# Patient Record
Sex: Male | Born: 1980 | Race: Black or African American | Hispanic: No | Marital: Single | Smoking: Never smoker
Health system: Southern US, Community
[De-identification: ages and names within clinical notes are randomized; demographics above are authoritative.]

## PROBLEM LIST (undated history)

## (undated) DIAGNOSIS — K122 Cellulitis and abscess of mouth: Secondary | ICD-10-CM

## (undated) HISTORY — PX: ABSCESS DRAINAGE: SHX1119

## (undated) HISTORY — PX: APPENDECTOMY: SHX54

---

## 2002-02-20 ENCOUNTER — Emergency Department (HOSPITAL_COMMUNITY): Admission: EM | Admit: 2002-02-20 | Discharge: 2002-02-20 | Payer: Self-pay | Admitting: Emergency Medicine

## 2002-02-20 ENCOUNTER — Encounter: Payer: Self-pay | Admitting: Emergency Medicine

## 2002-08-12 ENCOUNTER — Emergency Department (HOSPITAL_COMMUNITY): Admission: EM | Admit: 2002-08-12 | Discharge: 2002-08-12 | Payer: Self-pay | Admitting: Emergency Medicine

## 2002-08-12 ENCOUNTER — Encounter: Payer: Self-pay | Admitting: Emergency Medicine

## 2004-07-19 ENCOUNTER — Emergency Department (HOSPITAL_COMMUNITY): Admission: EM | Admit: 2004-07-19 | Discharge: 2004-07-19 | Payer: Self-pay | Admitting: Emergency Medicine

## 2005-03-04 ENCOUNTER — Emergency Department (HOSPITAL_COMMUNITY): Admission: EM | Admit: 2005-03-04 | Discharge: 2005-03-04 | Payer: Self-pay | Admitting: Family Medicine

## 2006-07-04 ENCOUNTER — Emergency Department (HOSPITAL_COMMUNITY): Admission: EM | Admit: 2006-07-04 | Discharge: 2006-07-04 | Payer: Self-pay | Admitting: Emergency Medicine

## 2007-09-01 ENCOUNTER — Emergency Department (HOSPITAL_COMMUNITY): Admission: EM | Admit: 2007-09-01 | Discharge: 2007-09-01 | Payer: Self-pay | Admitting: Emergency Medicine

## 2009-01-15 ENCOUNTER — Emergency Department (HOSPITAL_COMMUNITY): Admission: EM | Admit: 2009-01-15 | Discharge: 2009-01-15 | Payer: Self-pay | Admitting: Family Medicine

## 2010-04-25 ENCOUNTER — Inpatient Hospital Stay (HOSPITAL_COMMUNITY): Admission: EM | Admit: 2010-04-25 | Discharge: 2010-04-27 | Payer: Self-pay | Admitting: Emergency Medicine

## 2010-09-06 ENCOUNTER — Emergency Department (HOSPITAL_COMMUNITY)
Admission: EM | Admit: 2010-09-06 | Discharge: 2010-09-06 | Payer: Self-pay | Source: Home / Self Care | Admitting: Emergency Medicine

## 2016-07-29 ENCOUNTER — Emergency Department (HOSPITAL_COMMUNITY)
Admission: EM | Admit: 2016-07-29 | Discharge: 2016-07-29 | Disposition: A | Discharge status: Home / Self Care | Payer: Self-pay | Attending: Emergency Medicine | Admitting: Emergency Medicine

## 2016-07-29 ENCOUNTER — Encounter (HOSPITAL_COMMUNITY): Payer: Self-pay | Admitting: Emergency Medicine

## 2016-07-29 DIAGNOSIS — F17200 Nicotine dependence, unspecified, uncomplicated: Secondary | ICD-10-CM | POA: Insufficient documentation

## 2016-07-29 DIAGNOSIS — K047 Periapical abscess without sinus: Secondary | ICD-10-CM | POA: Insufficient documentation

## 2016-07-29 MED ORDER — IBUPROFEN 400 MG PO TABS
ORAL_TABLET | ORAL | Status: AC
  Filled 2016-07-29: qty 1

## 2016-07-29 MED ORDER — CLINDAMYCIN HCL 150 MG PO CAPS
450.0000 mg | ORAL_CAPSULE | Freq: Three times a day (TID) | ORAL | 0 refills | Status: AC
Start: 2016-07-29 — End: 2016-08-08

## 2016-07-29 MED ORDER — IBUPROFEN 400 MG PO TABS
400.0000 mg | ORAL_TABLET | Freq: Once | ORAL | Status: AC | PRN
  Administered 2016-07-29: 400 mg via ORAL

## 2016-07-29 MED ORDER — CLINDAMYCIN HCL 150 MG PO CAPS
450.0000 mg | ORAL_CAPSULE | Freq: Once | ORAL | Status: AC
  Administered 2016-07-29: 450 mg via ORAL
  Filled 2016-07-29: qty 3

## 2016-07-29 NOTE — ED Notes (Signed)
Pt stable, understands discharge instructions, and reasons for return.   

## 2016-07-29 NOTE — ED Triage Notes (Signed)
Pt states "my face has been swollen since last night, its just getting bigger and bigger. Its throbbing right now. Ive had an abscess on my bottom jaw years ago. Now its on the top half and now it feels like its in my cheek bone." Pt has large amount of swelling to L side of face. Resp e/u.

## 2016-07-29 NOTE — ED Provider Notes (Signed)
[facility] Provider Note   CSN: [id number] Arrival date & time: 07/29/16  1356     History   Chief Complaint Chief Complaint  Patient presents with  . Facial Swelling    HPI Anthony Macias is a 35 y.o. male.   Illness  This is a new problem. The current episode started 2 days ago. The problem occurs constantly. The problem has been gradually worsening. Pertinent negatives include no chest pain, no abdominal pain and no shortness of breath. Associated symptoms comments: Face swelling and pain. Nothing aggravates the symptoms. Nothing relieves the symptoms. He has tried acetaminophen for the symptoms. The treatment provided moderate relief.    History reviewed. No pertinent past medical history.  There are no active problems to display for this patient.   Past Surgical History:  Procedure Laterality Date  . ABSCESS DRAINAGE     lower jaw  . APPENDECTOMY         Home Medications    Prior to Admission medications   Medication Sig Start Date End Date Taking? Authorizing Provider  acetaminophen (TYLENOL) 500 MG tablet Take 500 mg by mouth every 6 (six) hours as needed for headache (or pain).   Yes Historical Provider, MD  [employer] OP Apply 1-2 drops to eye 3 (three) times daily as needed (for dryness).   Yes Historical Provider, MD  ibuprofen (ADVIL,MOTRIN) 200 MG tablet Take 200-400 mg by mouth every 6 (six) hours as needed for headache (or pain).   Yes Historical Provider, MD  clindamycin (CLEOCIN) 150 MG capsule Take 3 capsules (450 mg total) by mouth 3 (three) times daily. 07/29/16 08/08/16  Cherlynn PerchesEric Limmie Schoenberg, MD    Family History No family history on file.  Social History Social History  Substance Use Topics  . Smoking status: Current Some Day Smoker  . Smokeless tobacco: Not on file  . Alcohol use Yes     Allergies   Patient has no known allergies.   Review of Systems Review of Systems  Constitutional: Negative for chills and fever.    HENT: Positive for congestion, dental problem, facial swelling, rhinorrhea and sinus pressure. Negative for drooling, ear pain, sore throat and trouble swallowing.   Eyes: Negative for pain and visual disturbance.  Respiratory: Negative for cough and shortness of breath.   Cardiovascular: Negative for chest pain and palpitations.  Gastrointestinal: Negative for abdominal pain and vomiting.  Genitourinary: Negative for dysuria and hematuria.  Musculoskeletal: Negative for arthralgias and back pain.  Skin: Negative for color change and rash.  Neurological: Negative for seizures and syncope.  All other systems reviewed and are negative.    Physical Exam Updated Vital Signs BP (!) 146/101   Pulse 90   Temp 99.3 F (37.4 C) (Oral)   Resp 16   SpO2 98%   Physical Exam  Constitutional: He appears well-developed and well-nourished.  HENT:  Head: Normocephalic and atraumatic. Head is without right periorbital erythema and without left periorbital erythema.    Right Ear: Tympanic membrane normal.  Left Ear: Tympanic membrane normal.  Nose: Nose normal.  Mouth/Throat: Uvula is midline, oropharynx is clear and moist and mucous membranes are normal. Abnormal dentition. Dental caries present. No uvula swelling.  No induration or swelling along the gumline, palpation of the nuchal glucose as well as the parotid area on the lateral face shows some induration with no fluctuance. Multiple dental caries fractured teeth and missing teeth, no induration under the, no trouble swallowing uvula midline.  Eyes: Conjunctivae  are normal.  Neck: Neck supple.  Cardiovascular: Normal rate and regular rhythm.   No murmur heard. Pulmonary/Chest: Effort normal and breath sounds normal. No respiratory distress.  Abdominal: Soft. There is no tenderness.  Musculoskeletal: He exhibits no edema.  Neurological: He is alert.  Skin: Skin is warm and dry.  Psychiatric: He has a normal mood and affect.  Nursing  note and vitals reviewed.    ED Treatments / Results  Labs (all labs ordered are listed, but only abnormal results are displayed) Labs Reviewed - No data to display  EKG  EKG Interpretation None       Radiology No results found.  Procedures Procedures (including critical care time)  Medications Ordered in ED Medications  ibuprofen (ADVIL,MOTRIN) tablet 400 mg ( Oral Canceled Entry 07/29/16 1803)  clindamycin (CLEOCIN) capsule 450 mg (450 mg Oral Given 07/29/16 1926)     Initial Impression / Assessment and Plan / ED Course  I have reviewed the triage vital signs and the nursing notes.  Pertinent labs & imaging results that were available during my care of the patient were reviewed by me and considered in my medical decision making (see chart for details).  Clinical Course    35 year old male comes with recurrent face swelling. His left cheek is swollen. No abscess palpated within the nuchal mucosa or gingival mucosa. Induration along the surface but no fluctuant collection. Very poor dentition likely dental abscess patient is given clindamycin and told to use NSAIDs and Tylenol for pain. He is to follow-up with dentistry. He has had problems with this beforealways a dentistry evaluate and treat. Patient is nontoxic-appearing no trouble swallowing no concerns for cellulitis of the eye as he is able to range it fully does not extend around the eye. He is afebrile vital signs are stable. He is safe for discharge home. Strict return precautions are given.  Final Clinical Impressions(s) / ED Diagnoses   Final diagnoses:  Dental abscess    New Prescriptions Discharge Medication List as of 07/29/2016  7:22 PM    START taking these medications   Details  clindamycin (CLEOCIN) 150 MG capsule Take 3 capsules (450 mg total) by mouth 3 (three) times daily., Starting Fri 07/29/2016, Until Mon 08/08/2016, Print         Cherlynn PerchesEric Lauralye Kinn, MD 07/30/16 [id number]    Tilden FossaElizabeth Rees, MD 08/01/16  [id number]

## 2016-07-29 NOTE — ED Notes (Signed)
Pt states his pain has increased to a 5/10. Given ibuprofen for the pain.

## 2016-07-30 ENCOUNTER — Emergency Department (HOSPITAL_COMMUNITY)
Admission: EM | Admit: 2016-07-30 | Discharge: 2016-07-30 | Disposition: A | Discharge status: Home / Self Care | Payer: Self-pay | Attending: Emergency Medicine | Admitting: Emergency Medicine

## 2016-07-30 ENCOUNTER — Telehealth: Payer: Self-pay | Admitting: *Deleted

## 2016-07-30 ENCOUNTER — Encounter (HOSPITAL_COMMUNITY): Payer: Self-pay | Admitting: Emergency Medicine

## 2016-07-30 DIAGNOSIS — F17200 Nicotine dependence, unspecified, uncomplicated: Secondary | ICD-10-CM | POA: Insufficient documentation

## 2016-07-30 DIAGNOSIS — K047 Periapical abscess without sinus: Secondary | ICD-10-CM | POA: Insufficient documentation

## 2016-07-30 MED ORDER — IBUPROFEN 400 MG PO TABS
800.0000 mg | ORAL_TABLET | Freq: Once | ORAL | Status: AC
  Administered 2016-07-30: 800 mg via ORAL
  Filled 2016-07-30: qty 2

## 2016-07-30 MED ORDER — CLINDAMYCIN HCL 150 MG PO CAPS
300.0000 mg | ORAL_CAPSULE | Freq: Once | ORAL | Status: AC
  Administered 2016-07-30: 300 mg via ORAL
  Filled 2016-07-30: qty 2

## 2016-07-30 MED ORDER — NAPROXEN 250 MG PO TABS: 250.0000 mg | ORAL_TABLET | Freq: Two times a day (BID) | ORAL | 0 refills | Status: DC

## 2016-07-30 MED ORDER — OXYCODONE-ACETAMINOPHEN 5-325 MG PO TABS: 1.0000 | ORAL_TABLET | Freq: Four times a day (QID) | ORAL | 0 refills | Status: DC | PRN

## 2016-07-30 MED ORDER — OXYCODONE-ACETAMINOPHEN 5-325 MG PO TABS
2.0000 | ORAL_TABLET | Freq: Once | ORAL | Status: AC
  Administered 2016-07-30: 2 via ORAL
  Filled 2016-07-30: qty 2

## 2016-07-30 MED ORDER — ONDANSETRON 4 MG PO TBDP
4.0000 mg | ORAL_TABLET | Freq: Once | ORAL | Status: AC
  Administered 2016-07-30: 4 mg via ORAL
  Filled 2016-07-30: qty 1

## 2016-07-30 MED ORDER — BUPIVACAINE-EPINEPHRINE 0.5% -1:200000 IJ SOLN: 1.8000 mL | Freq: Once | INTRAMUSCULAR | Status: DC

## 2016-07-30 NOTE — Telephone Encounter (Signed)
Offered Good Rx to pts mother which offered considerable discount. $20.25. Mother pleased. No further CM needs at present.

## 2016-07-30 NOTE — ED Provider Notes (Signed)
[facility] Provider Note   CSN: [id number] Arrival date & time: 07/30/16  0734     History   Chief Complaint Chief Complaint  Patient presents with  . Dental Pain    HPI Anthony Macias is a 35 y.o. male.  Anthony Macias is a 35 y.o. Male who presents to the ED complaining of increasing facial swelling and dental pain. She was seen in the emergency department yesterday and discharged with prescriptions for ibuprofen and clindamycin. He was given his first visit clindamycin in the emergency department. He tells me he was unable to obtain his antibiotics as he did not have a ride. He has had no new treatments since being seen last night. He tells me he feels like his facial swelling has worsened. He's had no trouble opening his mouth or swallowing. No sore throat. No discharge from his mouth. He denies any fevers or neck pain. No trouble moving his neck or tongue. He has not followed up with a dentist. He does not have a current dentist. He's had many problems with dental abscesses previously. Patient denies fevers, neck pain, neck stiffness, discharge from his mouth, trouble swallowing, trouble opening his mouth, ear pain, ear discharge, rashes, changes to his vision or other symptoms.   The history is provided by the patient. No language interpreter was used.  Dental Pain      History reviewed. No pertinent past medical history.  There are no active problems to display for this patient.   Past Surgical History:  Procedure Laterality Date  . ABSCESS DRAINAGE     lower jaw  . APPENDECTOMY         Home Medications    Prior to Admission medications   Medication Sig Start Date End Date Taking? Authorizing Provider  acetaminophen (TYLENOL) 500 MG tablet Take 500 mg by mouth every 6 (six) hours as needed for headache (or pain).    Historical Provider, MD  [employer] OP Apply 1-2 drops to eye 3 (three) times daily as needed (for dryness).    Historical  Provider, MD  clindamycin (CLEOCIN) 150 MG capsule Take 3 capsules (450 mg total) by mouth 3 (three) times daily. 07/29/16 08/08/16  Cherlynn PerchesEric Katz, MD  ibuprofen (ADVIL,MOTRIN) 200 MG tablet Take 200-400 mg by mouth every 6 (six) hours as needed for headache (or pain).    Historical Provider, MD  naproxen (NAPROSYN) 250 MG tablet Take 1 tablet (250 mg total) by mouth 2 (two) times daily with a meal. 07/30/16   Everlene FarrierWilliam Mehtaab Mayeda, PA-C  oxyCODONE-acetaminophen (PERCOCET/ROXICET) 5-325 MG tablet Take 1-2 tablets by mouth every 6 (six) hours as needed for moderate pain or severe pain. 07/30/16   Everlene FarrierWilliam Katharyn Schauer, PA-C    Family History No family history on file.  Social History Social History  Substance Use Topics  . Smoking status: Current Some Day Smoker  . Smokeless tobacco: Current User  . Alcohol use Yes     Allergies   Patient has no known allergies.   Review of Systems Review of Systems  Constitutional: Negative for chills and fever.  HENT: Positive for dental problem and facial swelling. Negative for drooling, ear pain, mouth sores, sore throat, trouble swallowing and voice change.   Eyes: Negative for pain and visual disturbance.  Musculoskeletal: Negative for neck pain and neck stiffness.  Skin: Negative for rash.  Neurological: Negative for headaches.     Physical Exam Updated Vital Signs BP (!) 141/101 (BP Location: Right Arm)  Pulse 96   Temp 98.2 F (36.8 C) (Oral)   Resp 16   Ht 5' 10.5" (1.791 m)   Wt 77.1 kg   SpO2 96%   BMI 24.05 kg/m   Physical Exam  Constitutional: He is oriented to person, place, and time. He appears well-developed and well-nourished. No distress.  Non-toxic appearing.   HENT:  Head: Normocephalic and atraumatic.  Right Ear: External ear normal.  Left Ear: External ear normal.  Mouth/Throat: Oropharynx is clear and moist. No oropharyngeal exudate.  Tenderness to left upper molars. Facial swelling noted. Induration noted in his mouth with  some slight fluctuance.  No discharge from the mouth.   Uvula is midline without edema. Soft palate rises symmetrically. No tonsillar hypertrophy or exudates. Tongue protrusion is normal. No trismus. He is able to open his mouth more than three finger widths. No swelling or TTP to his mandible or beneath his mandible. No drooling.   Eyes: Conjunctivae are normal. Pupils are equal, round, and reactive to light. Right eye exhibits no discharge. Left eye exhibits no discharge.  Neck: Normal range of motion. Neck supple. No JVD present. No tracheal deviation present.  Cardiovascular: Normal rate, regular rhythm, normal heart sounds and intact distal pulses.   Pulmonary/Chest: Effort normal and breath sounds normal. No stridor. No respiratory distress.  Abdominal: Soft. There is no tenderness.  Lymphadenopathy:    He has no cervical adenopathy.  Neurological: He is alert and oriented to person, place, and time. No cranial nerve deficit. Coordination normal.  Skin: Skin is warm and dry. Capillary refill takes less than 2 seconds. No rash noted. He is not diaphoretic. No erythema. No pallor.  Psychiatric: He has a normal mood and affect. His behavior is normal.  Nursing note and vitals reviewed.    ED Treatments / Results  Labs (all labs ordered are listed, but only abnormal results are displayed) Labs Reviewed - No data to display  EKG  EKG Interpretation None       Radiology No results found.  Procedures .Marland Kitchen.Incision and Drainage Date/Time: 07/30/2016 8:31 AM Performed by: Everlene FarrierANSIE, Nohlan Burdin Authorized by: Everlene FarrierANSIE, Dawson Albers   Consent:    Consent obtained:  Verbal   Consent given by:  Patient   Risks discussed:  Bleeding, incomplete drainage, pain, infection and damage to other organs   Alternatives discussed:  No treatment and alternative treatment Location:    Type:  Abscess   Location:  Mouth   Mouth location:  Alveolar process Anesthesia (see MAR for exact dosages):     Anesthesia method:  Local infiltration   Local anesthetic:  Bupivacaine 0.5% WITH epi Procedure type:    Complexity:  Complex Procedure details:    Needle aspiration: no     Incision types:  Stab incision   Incision depth:  Submucosal   Scalpel blade:  15   Wound management:  Probed and deloculated and irrigated with saline   Drainage:  Bloody and purulent   Drainage amount:  Moderate   Wound treatment:  Wound left open   Packing materials:  None Post-procedure details:    Patient tolerance of procedure:  Tolerated well, no immediate complications Comments:     Patient had I&D my Dentist Dr. Theodoro Gristho and myself supervising.    (including critical care time)  Medications Ordered in ED Medications  clindamycin (CLEOCIN) capsule 300 mg (not administered)  ibuprofen (ADVIL,MOTRIN) tablet 800 mg (not administered)  oxyCODONE-acetaminophen (PERCOCET/ROXICET) 5-325 MG per tablet 2 tablet (not administered)  ondansetron (ZOFRAN-ODT)  disintegrating tablet 4 mg (not administered)     Initial Impression / Assessment and Plan / ED Course  I have reviewed the triage vital signs and the nursing notes.  Pertinent labs & imaging results that were available during my care of the patient were reviewed by me and considered in my medical decision making (see chart for details).  Clinical Course    This is a 35 y.o. Male who presents to the ED complaining of increasing facial swelling and dental pain. She was seen in the emergency department yesterday and discharged with prescriptions for ibuprofen and clindamycin. He was given his first visit clindamycin in the emergency department. He tells me he was unable to obtain his antibiotics as he did not have a ride. He has had no new treatments since being seen last night. He tells me he feels like his facial swelling has worsened. He's had no trouble opening his mouth or swallowing. No sore throat. No discharge from his mouth. He denies any fevers or neck  pain. No trouble moving his neck or tongue. He has not followed up with a dentist.  On exam patient is afebrile nontoxic appearing. He has swelling to the left side of his face with induration and mild fluctuance in the interior aspect. No drainage. No trismus. No drooling. He is able to open his mouth more than 3 finger widths. No changes to his voice. He is nontoxic-appearing. I would like to drain this patient's dental abscess. I saw the patient with [facility] resident Dentist Dr. Theodoro Grist and together we preformed an I&D and this was tolerated well by the patient. He tells me his friend is picking up his clindamycin today at the pharmacy. Will provide him with another dose here today until he can get this picked up. Also provided him with a prescription for naproxen and a short course of Percocet. I provided him with information for follow-up with dentist Dr. Mayford Knife. I advised the patient to follow-up with their primary care provider this week. I advised the patient to return to the emergency department with new or worsening symptoms or new concerns. The patient verbalized understanding and agreement with plan.      Final Clinical Impressions(s) / ED Diagnoses   Final diagnoses:  Dental abscess    New Prescriptions New Prescriptions   NAPROXEN (NAPROSYN) 250 MG TABLET    Take 1 tablet (250 mg total) by mouth 2 (two) times daily with a meal.   OXYCODONE-ACETAMINOPHEN (PERCOCET/ROXICET) 5-325 MG TABLET    Take 1-2 tablets by mouth every 6 (six) hours as needed for moderate pain or severe pain.     Everlene Farrier, PA-C 07/30/16 1610    Laurence Spates, MD 07/31/16 [phone]

## 2016-07-30 NOTE — ED Triage Notes (Signed)
Pt. Stated, I was here last night for the same problem dental/facial swelling. Did not get the medication filled.

## 2016-12-29 ENCOUNTER — Emergency Department (HOSPITAL_COMMUNITY)
Admission: EM | Admit: 2016-12-29 | Discharge: 2016-12-30 | Disposition: A | Discharge status: Home / Self Care | Payer: Self-pay | Attending: Emergency Medicine | Admitting: Emergency Medicine

## 2016-12-29 ENCOUNTER — Encounter (HOSPITAL_COMMUNITY): Payer: Self-pay

## 2016-12-29 DIAGNOSIS — K047 Periapical abscess without sinus: Secondary | ICD-10-CM | POA: Insufficient documentation

## 2016-12-29 DIAGNOSIS — F17200 Nicotine dependence, unspecified, uncomplicated: Secondary | ICD-10-CM | POA: Insufficient documentation

## 2016-12-29 DIAGNOSIS — Z79899 Other long term (current) drug therapy: Secondary | ICD-10-CM | POA: Insufficient documentation

## 2016-12-29 HISTORY — DX: Cellulitis and abscess of mouth: K12.2

## 2016-12-29 NOTE — ED Provider Notes (Signed)
[facility] Provider Note   CSN: [id number] Arrival date & time: 12/29/16  2155  By signing my name below, I, Modena JanskyAlbert Thayil, attest that this documentation has been prepared under the direction and in the presence of non-physician practitioner, Kerrie BuffaloHope Neese, NP. Electronically Signed: Modena JanskyAlbert Thayil, Scribe. 12/29/2016. 11:55 PM.  History   Chief Complaint No chief complaint on file.  The history is provided by the patient. No language interpreter was used.  Dental Pain   This is a new problem. The problem occurs constantly. The problem has not changed since onset.The pain is moderate. He has tried nothing for the symptoms.   HPI Comments: Anthony Killiansaiah Anthony Macias is a 36 y.o. male with a PMHx of mouth infection who presents to the Emergency Department complaining of constant moderate left upper dental pain that started a few months ago. He states he was seen in the ED on 07/30/16 for a dental abscess but never followed up with a dentist. He had temporary relief then but his pain has worsened otherwise. He reports associated left-sided facial swelling. Denies any other complaints at this time.  Past Medical History:  Diagnosis Date  . Infection of mouth     There are no active problems to display for this patient.   Past Surgical History:  Procedure Laterality Date  . ABSCESS DRAINAGE     lower jaw  . APPENDECTOMY         Home Medications    Prior to Admission medications   Medication Sig Start Date End Date Taking? Authorizing Provider  acetaminophen (TYLENOL) 500 MG tablet Take 500 mg by mouth every 6 (six) hours as needed for headache (or pain).    Historical Provider, MD  amoxicillin (AMOXIL) 500 MG capsule Take 1 capsule (500 mg total) by mouth 3 (three) times daily. 12/30/16   Hope Orlene OchM Neese, NP  [employer] Apply 1-2 drops to eye 3 (three) times daily as needed (for dryness).    Historical Provider, MD  ibuprofen (ADVIL,MOTRIN) 600 MG tablet Take 1 tablet (600  mg total) by mouth every 6 (six) hours as needed. 12/30/16   Hope Orlene OchM Neese, NP  traMADol (ULTRAM) 50 MG tablet Take 1 tablet (50 mg total) by mouth every 6 (six) hours as needed. 12/30/16   Hope Orlene OchM Neese, NP    Family History No family history on file.  Social History Social History  Substance Use Topics  . Smoking status: Current Some Day Smoker  . Smokeless tobacco: Current User  . Alcohol use Yes     Allergies   Patient has no known allergies.   Review of Systems Review of Systems  Constitutional: Negative for chills and fever.  HENT: Positive for dental problem, facial swelling (Left-sided) and mouth sores. Negative for trouble swallowing.   Gastrointestinal: Negative for nausea and vomiting.  Musculoskeletal: Negative for neck pain.  Neurological: Negative for headaches.     Physical Exam Updated Vital Signs BP (!) 137/96 (BP Location: Right Arm)   Pulse 67   Temp 98.9 F (37.2 C) (Oral)   Resp 18   SpO2 97%   Physical Exam  Constitutional: He appears well-developed and well-nourished. No distress.  HENT:  Head: Normocephalic and atraumatic.  Mouth/Throat: No trismus in the jaw. Dental abscesses and dental caries present.  Multiple dental carries. Left upper molar is decayed to the gumline. Swelling to gum surrounding the tooth. Abscess noted. Facial swelling on the left.   Eyes: Conjunctivae are normal.  Neck: Neck  supple.  Cardiovascular: Normal rate.   Pulmonary/Chest: Effort normal.  Abdominal: Soft.  Musculoskeletal: Normal range of motion.  Neurological: He is alert.  Skin: Skin is warm and dry.  Psychiatric: He has a normal mood and affect.  Nursing note and vitals reviewed.    ED Treatments / Results  DIAGNOSTIC STUDIES: Oxygen Saturation is 97% on RA, normal by my interpretation.    COORDINATION OF CARE: 11:59 PM- Pt advised of plan for treatment and pt agrees.  Labs (all labs ordered are listed, but only abnormal results are displayed) Labs  Reviewed - No data to display   Radiology No results found.  Procedures .Marland KitchenIncision and Drainage Date/Time: 12/30/2016 12:40 AM Performed by: Janne Napoleon Authorized by: Janne Napoleon   Consent:    Consent obtained:  Verbal   Consent given by:  Patient   Risks discussed:  Incomplete drainage and pain   Alternatives discussed:  No treatment and referral Location:    Type:  Abscess   Location:  Mouth (upper left gum above first molar) Anesthesia (see MAR for exact dosages):    Anesthesia method:  Topical application Procedure type:    Complexity:  Simple Procedure details:    Needle aspiration: yes     Needle size:  18 G   Drainage:  Purulent   Drainage amount:  Moderate   Wound treatment:  Wound left open Post-procedure details:    Patient tolerance of procedure:  Tolerated well, no immediate complications   (including critical care time)  Medications Ordered in ED Medications  amoxicillin (AMOXIL) capsule 500 mg (500 mg Oral Given 12/30/16 0041)  HYDROcodone-acetaminophen (NORCO/VICODIN) 5-325 MG per tablet 1 tablet (1 tablet Oral Given 12/30/16 0041)     Initial Impression / Assessment and Plan / ED Course  I have reviewed the triage vital signs and the nursing notes.  Patient with toothache and abscess.  Exam unconcerning for Ludwig'Anthony angina or spread of infection.  Will treat with amoxicillin and pain medicine.  Urged patient to follow-up with dentist. Referral given  Final Clinical Impressions(Anthony) / ED Diagnoses   Final diagnoses:  Dental abscess    New Prescriptions New Prescriptions   AMOXICILLIN (AMOXIL) 500 MG CAPSULE    Take 1 capsule (500 mg total) by mouth 3 (three) times daily.   IBUPROFEN (ADVIL,MOTRIN) 600 MG TABLET    Take 1 tablet (600 mg total) by mouth every 6 (six) hours as needed.   TRAMADOL (ULTRAM) 50 MG TABLET    Take 1 tablet (50 mg total) by mouth every 6 (six) hours as needed.   I personally performed the services described in this  documentation, which was scribed in my presence. The recorded information has been reviewed and is accurate.     [address] [place], [place] 12/30/16 1610    Tilden Fossa, MD 12/30/16 [phone]

## 2016-12-29 NOTE — ED Triage Notes (Signed)
Pt states he came in to be tx for mouth infections; pt states sx started 24 hours ago; pt states he has been seen for same sx a month ago; pt states he never followed up with dentist; pt states sx resolved but came back; pt presents with swelling to left side of jaw; pt denies pain on at triage but states discomfort;

## 2016-12-30 MED ORDER — TRAMADOL HCL 50 MG PO TABS
50.0000 mg | ORAL_TABLET | Freq: Four times a day (QID) | ORAL | 0 refills | Status: DC | PRN
Start: 2016-12-30 — End: 2017-05-14

## 2016-12-30 MED ORDER — AMOXICILLIN 500 MG PO CAPS: 500.0000 mg | ORAL_CAPSULE | Freq: Three times a day (TID) | ORAL | 0 refills | Status: DC

## 2016-12-30 MED ORDER — IBUPROFEN 600 MG PO TABS: 600.0000 mg | ORAL_TABLET | Freq: Four times a day (QID) | ORAL | 0 refills | Status: DC | PRN

## 2016-12-30 MED ORDER — HYDROCODONE-ACETAMINOPHEN 5-325 MG PO TABS
1.0000 | ORAL_TABLET | Freq: Once | ORAL | Status: AC
  Administered 2016-12-30: 1 via ORAL
  Filled 2016-12-30: qty 1

## 2016-12-30 MED ORDER — AMOXICILLIN 500 MG PO CAPS
500.0000 mg | ORAL_CAPSULE | Freq: Once | ORAL | Status: AC
  Administered 2016-12-30: 500 mg via ORAL
  Filled 2016-12-30: qty 1

## 2016-12-30 NOTE — Discharge Instructions (Signed)
Do not drive while taking the narcotic as it will make you sleepy. Follow up with Dr. Michiel SitesKoelling.

## 2017-05-14 ENCOUNTER — Encounter (HOSPITAL_COMMUNITY): Payer: Self-pay | Admitting: Emergency Medicine

## 2017-05-14 ENCOUNTER — Emergency Department (HOSPITAL_COMMUNITY)
Admission: EM | Admit: 2017-05-14 | Discharge: 2017-05-14 | Disposition: A | Discharge status: Home / Self Care | Payer: Self-pay | Attending: Emergency Medicine | Admitting: Emergency Medicine

## 2017-05-14 DIAGNOSIS — F17200 Nicotine dependence, unspecified, uncomplicated: Secondary | ICD-10-CM | POA: Insufficient documentation

## 2017-05-14 DIAGNOSIS — K047 Periapical abscess without sinus: Secondary | ICD-10-CM | POA: Insufficient documentation

## 2017-05-14 MED ORDER — IBUPROFEN 400 MG PO TABS
600.0000 mg | ORAL_TABLET | Freq: Once | ORAL | Status: AC
  Administered 2017-05-14: 600 mg via ORAL
  Filled 2017-05-14: qty 1

## 2017-05-14 MED ORDER — IBUPROFEN 600 MG PO TABS: 600.0000 mg | ORAL_TABLET | Freq: Three times a day (TID) | ORAL | 0 refills | Status: AC | PRN

## 2017-05-14 MED ORDER — PENICILLIN V POTASSIUM 250 MG PO TABS
500.0000 mg | ORAL_TABLET | Freq: Once | ORAL | Status: AC
  Administered 2017-05-14: 500 mg via ORAL
  Filled 2017-05-14: qty 2

## 2017-05-14 MED ORDER — PENICILLIN V POTASSIUM 500 MG PO TABS: 500.0000 mg | ORAL_TABLET | Freq: Three times a day (TID) | ORAL | 0 refills | Status: AC

## 2017-05-14 NOTE — ED Triage Notes (Signed)
Pt having 5/10 left lower dental pain that started last night, pt states he has hx of abscess on the past and want to be check. No fever or chills.

## 2017-05-14 NOTE — ED Provider Notes (Signed)
[facility]-EMERGENCY DEPT Provider Note   CSN: [id number] Arrival date & time: 05/14/17  0242     History   Chief Complaint Chief Complaint  Patient presents with  . Dental Pain    HPI Anthony Macias is a 36 y.o. male.  HPI Patient is a 36 year old male presents emergency department left lower dental pain which began last night.  His pain is moderate in severity.  He reports a prior history of dental abscess and feels like this may be the beginning of one again.  Denies fevers and chills.  No difficulty breathing or swallowing.  No facial or neck swelling.  No other complaints.   Past Medical History:  Diagnosis Date  . Infection of mouth     There are no active problems to display for this patient.   Past Surgical History:  Procedure Laterality Date  . ABSCESS DRAINAGE     lower jaw  . APPENDECTOMY         Home Medications    Prior to Admission medications   Medication Sig Start Date End Date Taking? Authorizing Provider  ibuprofen (ADVIL,MOTRIN) 600 MG tablet Take 1 tablet (600 mg total) by mouth every 8 (eight) hours as needed for mild pain or moderate pain. 05/14/17   Azalia Bilis, MD  penicillin v potassium (VEETID) 500 MG tablet Take 1 tablet (500 mg total) by mouth 3 (three) times daily. 05/14/17   Azalia Bilis, MD    Family History No family history on file.  Social History Social History  Substance Use Topics  . Smoking status: Current Some Day Smoker  . Smokeless tobacco: Current User  . Alcohol use Yes     Allergies   Patient has no known allergies.   Review of Systems Review of Systems  All other systems reviewed and are negative.    Physical Exam Updated Vital Signs BP (!) 137/92 (BP Location: Right Arm)   Pulse 66   Temp 98.5 F (36.9 C) (Oral)   Resp 18   Ht 6' (1.829 m)   Wt 77.1 kg (170 lb)   SpO2 97%   BMI 23.06 kg/m   Physical Exam  Constitutional: He is oriented to person, place, and time. He appears well-developed and  well-nourished.  HENT:  Head: Normocephalic.  Obvious dental decay of tooth #20 with some tenderness.  No gingival swelling or fluctuance.  Oral airway patent.  No swelling under his tongue.  Tolerating secretions.  No trismus.  Eyes: EOM are normal.  Neck: Normal range of motion.  Anterior neck normal.  No significant lymphadenopathy.  No swelling.  No erythema.  Pulmonary/Chest: Effort normal.  Abdominal: He exhibits no distension.  Musculoskeletal: Normal range of motion.  Neurological: He is alert and oriented to person, place, and time.  Psychiatric: He has a normal mood and affect.  Nursing note and vitals reviewed.    ED Treatments / Results  Labs (all labs ordered are listed, but only abnormal results are displayed) Labs Reviewed - No data to display  EKG  EKG Interpretation None       Radiology No results found.  Procedures Procedures (including critical care time)  Medications Ordered in ED Medications - No data to display   Initial Impression / Assessment and Plan / ED Course  I have reviewed the triage vital signs and the nursing notes.  Pertinent labs & imaging results that were available during my care of the patient were reviewed by me and considered in my medical decision  making (see chart for details).     No signs to suggest Ludwig angina.  Overall well-appearing.  Home with penicillin and anti-inflammatories.  Dental follow-up.  He understands to return to the ER for new or worsening symptoms  Final Clinical Impressions(s) / ED Diagnoses   Final diagnoses:  Dental infection    New Prescriptions New Prescriptions   PENICILLIN V POTASSIUM (VEETID) 500 MG TABLET    Take 1 tablet (500 mg total) by mouth 3 (three) times daily.     Azalia Bilis, MD 05/14/17 [phone]

## 2020-03-11 ENCOUNTER — Inpatient Hospital Stay (HOSPITAL_COMMUNITY)
Admission: EM | Admit: 2020-03-11 | Discharge: 2020-05-12 | DRG: 956 | Disposition: A | Discharge status: Rehabilitation Facility | Payer: 59 | Attending: General Surgery | Admitting: General Surgery

## 2020-03-11 ENCOUNTER — Emergency Department (HOSPITAL_COMMUNITY): Payer: 59

## 2020-03-11 ENCOUNTER — Encounter (HOSPITAL_COMMUNITY): Payer: Self-pay

## 2020-03-11 DIAGNOSIS — S42251A Displaced fracture of greater tuberosity of right humerus, initial encounter for closed fracture: Secondary | ICD-10-CM | POA: Diagnosis present

## 2020-03-11 DIAGNOSIS — S022XXA Fracture of nasal bones, initial encounter for closed fracture: Secondary | ICD-10-CM | POA: Diagnosis present

## 2020-03-11 DIAGNOSIS — G9600 Cerebrospinal fluid leak, unspecified: Secondary | ICD-10-CM | POA: Diagnosis present

## 2020-03-11 DIAGNOSIS — R402312 Coma scale, best motor response, none, at arrival to emergency department: Secondary | ICD-10-CM | POA: Diagnosis present

## 2020-03-11 DIAGNOSIS — Y908 Blood alcohol level of 240 mg/100 ml or more: Secondary | ICD-10-CM | POA: Diagnosis present

## 2020-03-11 DIAGNOSIS — G9389 Other specified disorders of brain: Secondary | ICD-10-CM | POA: Diagnosis present

## 2020-03-11 DIAGNOSIS — E876 Hypokalemia: Secondary | ICD-10-CM | POA: Diagnosis not present

## 2020-03-11 DIAGNOSIS — S42011A Anterior displaced fracture of sternal end of right clavicle, initial encounter for closed fracture: Secondary | ICD-10-CM | POA: Diagnosis present

## 2020-03-11 DIAGNOSIS — S0181XA Laceration without foreign body of other part of head, initial encounter: Secondary | ICD-10-CM | POA: Diagnosis present

## 2020-03-11 DIAGNOSIS — S72421A Displaced fracture of lateral condyle of right femur, initial encounter for closed fracture: Secondary | ICD-10-CM | POA: Diagnosis present

## 2020-03-11 DIAGNOSIS — Z452 Encounter for adjustment and management of vascular access device: Secondary | ICD-10-CM

## 2020-03-11 DIAGNOSIS — S065X9A Traumatic subdural hemorrhage with loss of consciousness of unspecified duration, initial encounter: Secondary | ICD-10-CM | POA: Diagnosis present

## 2020-03-11 DIAGNOSIS — S72432A Displaced fracture of medial condyle of left femur, initial encounter for closed fracture: Secondary | ICD-10-CM | POA: Diagnosis present

## 2020-03-11 DIAGNOSIS — S2242XA Multiple fractures of ribs, left side, initial encounter for closed fracture: Secondary | ICD-10-CM | POA: Diagnosis present

## 2020-03-11 DIAGNOSIS — S42136A Nondisplaced fracture of coracoid process, unspecified shoulder, initial encounter for closed fracture: Secondary | ICD-10-CM

## 2020-03-11 DIAGNOSIS — S0231XA Fracture of orbital floor, right side, initial encounter for closed fracture: Secondary | ICD-10-CM | POA: Diagnosis present

## 2020-03-11 DIAGNOSIS — S72402A Unspecified fracture of lower end of left femur, initial encounter for closed fracture: Secondary | ICD-10-CM | POA: Diagnosis present

## 2020-03-11 DIAGNOSIS — S02109A Fracture of base of skull, unspecified side, initial encounter for closed fracture: Secondary | ICD-10-CM | POA: Diagnosis present

## 2020-03-11 DIAGNOSIS — J9601 Acute respiratory failure with hypoxia: Secondary | ICD-10-CM | POA: Diagnosis present

## 2020-03-11 DIAGNOSIS — R402212 Coma scale, best verbal response, none, at arrival to emergency department: Secondary | ICD-10-CM | POA: Diagnosis present

## 2020-03-11 DIAGNOSIS — L8990 Pressure ulcer of unspecified site, unspecified stage: Secondary | ICD-10-CM | POA: Diagnosis not present

## 2020-03-11 DIAGNOSIS — H11421 Conjunctival edema, right eye: Secondary | ICD-10-CM | POA: Diagnosis present

## 2020-03-11 DIAGNOSIS — S066X9A Traumatic subarachnoid hemorrhage with loss of consciousness of unspecified duration, initial encounter: Principal | ICD-10-CM | POA: Diagnosis present

## 2020-03-11 DIAGNOSIS — D72829 Elevated white blood cell count, unspecified: Secondary | ICD-10-CM

## 2020-03-11 DIAGNOSIS — E870 Hyperosmolality and hypernatremia: Secondary | ICD-10-CM | POA: Diagnosis not present

## 2020-03-11 DIAGNOSIS — H0520 Unspecified exophthalmos: Secondary | ICD-10-CM | POA: Diagnosis present

## 2020-03-11 DIAGNOSIS — S0240EA Zygomatic fracture, right side, initial encounter for closed fracture: Secondary | ICD-10-CM | POA: Diagnosis present

## 2020-03-11 DIAGNOSIS — S52301A Unspecified fracture of shaft of right radius, initial encounter for closed fracture: Secondary | ICD-10-CM | POA: Diagnosis not present

## 2020-03-11 DIAGNOSIS — Y92410 Unspecified street and highway as the place of occurrence of the external cause: Secondary | ICD-10-CM

## 2020-03-11 DIAGNOSIS — J9690 Respiratory failure, unspecified, unspecified whether with hypoxia or hypercapnia: Secondary | ICD-10-CM

## 2020-03-11 DIAGNOSIS — S0240CA Maxillary fracture, right side, initial encounter for closed fracture: Secondary | ICD-10-CM | POA: Diagnosis present

## 2020-03-11 DIAGNOSIS — S22089A Unspecified fracture of T11-T12 vertebra, initial encounter for closed fracture: Secondary | ICD-10-CM | POA: Diagnosis not present

## 2020-03-11 DIAGNOSIS — S43004A Unspecified dislocation of right shoulder joint, initial encounter: Secondary | ICD-10-CM

## 2020-03-11 DIAGNOSIS — U071 COVID-19: Secondary | ICD-10-CM | POA: Diagnosis not present

## 2020-03-11 DIAGNOSIS — T148XXA Other injury of unspecified body region, initial encounter: Secondary | ICD-10-CM

## 2020-03-11 DIAGNOSIS — D62 Acute posthemorrhagic anemia: Secondary | ICD-10-CM | POA: Diagnosis not present

## 2020-03-11 DIAGNOSIS — S52044A Nondisplaced fracture of coronoid process of right ulna, initial encounter for closed fracture: Secondary | ICD-10-CM | POA: Diagnosis present

## 2020-03-11 DIAGNOSIS — I878 Other specified disorders of veins: Secondary | ICD-10-CM

## 2020-03-11 DIAGNOSIS — R402112 Coma scale, eyes open, never, at arrival to emergency department: Secondary | ICD-10-CM | POA: Diagnosis present

## 2020-03-11 DIAGNOSIS — S72423A Displaced fracture of lateral condyle of unspecified femur, initial encounter for closed fracture: Secondary | ICD-10-CM

## 2020-03-11 DIAGNOSIS — Z419 Encounter for procedure for purposes other than remedying health state, unspecified: Secondary | ICD-10-CM

## 2020-03-11 DIAGNOSIS — Z9889 Other specified postprocedural states: Secondary | ICD-10-CM

## 2020-03-11 DIAGNOSIS — S42145A Nondisplaced fracture of glenoid cavity of scapula, left shoulder, initial encounter for closed fracture: Secondary | ICD-10-CM | POA: Diagnosis present

## 2020-03-11 DIAGNOSIS — S82842A Displaced bimalleolar fracture of left lower leg, initial encounter for closed fracture: Secondary | ICD-10-CM | POA: Diagnosis present

## 2020-03-11 DIAGNOSIS — S51011A Laceration without foreign body of right elbow, initial encounter: Secondary | ICD-10-CM | POA: Diagnosis present

## 2020-03-11 DIAGNOSIS — T1490XA Injury, unspecified, initial encounter: Secondary | ICD-10-CM

## 2020-03-11 DIAGNOSIS — H02401 Unspecified ptosis of right eyelid: Secondary | ICD-10-CM | POA: Diagnosis present

## 2020-03-11 DIAGNOSIS — S32810A Multiple fractures of pelvis with stable disruption of pelvic ring, initial encounter for closed fracture: Secondary | ICD-10-CM | POA: Diagnosis not present

## 2020-03-11 DIAGNOSIS — S01111A Laceration without foreign body of right eyelid and periocular area, initial encounter: Secondary | ICD-10-CM | POA: Diagnosis present

## 2020-03-11 DIAGNOSIS — S72433A Displaced fracture of medial condyle of unspecified femur, initial encounter for closed fracture: Secondary | ICD-10-CM

## 2020-03-11 LAB — I-STAT CHEM 8, ED
BUN: 12 mg/dL (ref 6–20)
Calcium, Ion: 0.93 mmol/L — ABNORMAL LOW (ref 1.15–1.40)
Chloride: 106 mmol/L (ref 98–111)
Creatinine, Ser: 1.3 mg/dL — ABNORMAL HIGH (ref 0.61–1.24)
Glucose, Bld: 94 mg/dL (ref 70–99)
HCT: 41 % (ref 39.0–52.0)
Hemoglobin: 13.9 g/dL (ref 13.0–17.0)
Potassium: 3.2 mmol/L — ABNORMAL LOW (ref 3.5–5.1)
Sodium: 141 mmol/L (ref 135–145)
TCO2: 19 mmol/L — ABNORMAL LOW (ref 22–32)

## 2020-03-11 LAB — I-STAT ARTERIAL BLOOD GAS, ED
Acid-base deficit: 6 mmol/L — ABNORMAL HIGH (ref 0.0–2.0)
Bicarbonate: 20.2 mmol/L (ref 20.0–28.0)
Calcium, Ion: 1.07 mmol/L — ABNORMAL LOW (ref 1.15–1.40)
HCT: 38 % — ABNORMAL LOW (ref 39.0–52.0)
Hemoglobin: 12.9 g/dL — ABNORMAL LOW (ref 13.0–17.0)
O2 Saturation: 100 %
Potassium: 3.1 mmol/L — ABNORMAL LOW (ref 3.5–5.1)
Sodium: 140 mmol/L (ref 135–145)
TCO2: 21 mmol/L — ABNORMAL LOW (ref 22–32)
pCO2 arterial: 41.7 mmHg (ref 32.0–48.0)
pH, Arterial: 7.292 — ABNORMAL LOW (ref 7.350–7.450)
pO2, Arterial: 504 mmHg — ABNORMAL HIGH (ref 83.0–108.0)

## 2020-03-11 LAB — TRIGLYCERIDES: Triglycerides: 54 mg/dL (ref <150)

## 2020-03-11 LAB — SAMPLE TO BLOOD BANK

## 2020-03-11 MED ORDER — ONDANSETRON HCL 4 MG/2ML IJ SOLN
4.0000 mg | Freq: Four times a day (QID) | INTRAMUSCULAR | Status: DC | PRN
  Filled 2020-03-11 (×2): qty 2

## 2020-03-11 MED ORDER — ONDANSETRON 4 MG PO TBDP: 4.0000 mg | ORAL_TABLET | Freq: Four times a day (QID) | ORAL | Status: DC | PRN

## 2020-03-11 MED ORDER — DEXTROSE-NACL 5-0.9 % IV SOLN: INTRAVENOUS | Status: DC

## 2020-03-11 MED ORDER — HYDRALAZINE HCL 20 MG/ML IJ SOLN
10.0000 mg | INTRAMUSCULAR | Status: AC | PRN
  Administered 2020-03-12 – 2020-03-28 (×4): 10 mg via INTRAVENOUS
  Filled 2020-03-11 (×5): qty 1

## 2020-03-11 MED ORDER — IOHEXOL 300 MG/ML  SOLN
100.0000 mL | Freq: Once | INTRAMUSCULAR | Status: AC | PRN
  Administered 2020-03-11: 100 mL via INTRAVENOUS

## 2020-03-11 MED ORDER — SODIUM CHLORIDE 0.9 % IV SOLN
3.0000 g | Freq: Four times a day (QID) | INTRAVENOUS | Status: AC
  Administered 2020-03-12 – 2020-03-18 (×27): 3 g via INTRAVENOUS
  Filled 2020-03-11 (×5): qty 3
  Filled 2020-03-11 (×2): qty 8
  Filled 2020-03-11 (×2): qty 3
  Filled 2020-03-11 (×2): qty 8
  Filled 2020-03-11 (×7): qty 3
  Filled 2020-03-11: qty 8
  Filled 2020-03-11: qty 3
  Filled 2020-03-11: qty 8
  Filled 2020-03-11 (×3): qty 3
  Filled 2020-03-11 (×3): qty 8
  Filled 2020-03-11: qty 3

## 2020-03-11 MED ORDER — HYDROMORPHONE HCL 1 MG/ML IJ SOLN
1.0000 mg | INTRAMUSCULAR | Status: DC | PRN
  Administered 2020-03-12 – 2020-03-23 (×14): 1 mg via INTRAVENOUS
  Filled 2020-03-11 (×15): qty 1

## 2020-03-11 MED ORDER — PROPOFOL 1000 MG/100ML IV EMUL
5.0000 ug/kg/min | INTRAVENOUS | Status: DC
  Administered 2020-03-11: 10 ug/kg/min via INTRAVENOUS

## 2020-03-11 MED ORDER — LEVETIRACETAM IN NACL 500 MG/100ML IV SOLN
500.0000 mg | Freq: Two times a day (BID) | INTRAVENOUS | Status: AC
  Administered 2020-03-12 – 2020-03-18 (×14): 500 mg via INTRAVENOUS
  Filled 2020-03-11 (×15): qty 100

## 2020-03-11 NOTE — Progress Notes (Signed)
RT and RN transported pt from ED Trauma B to CT and back without complication. Pts respiratory status remained stable throughout transport/trip. RT will continue to monitor.
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
## 2020-03-11 NOTE — ED Notes (Signed)
Right shoulder reduced by Dr. Jeraldine Loots
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
## 2020-03-11 NOTE — Progress Notes (Signed)
RT obtained post intubation ABG pt on following settings with the following results. 520/16/+5/100%. RT notified MD Jeraldine Loots of results, rate increased to 18 per MD. RT will continue to monitor.   Results for Anthony Macias, Anthony Macias (MRN [id number]) as of 03/11/2020 23:14  Ref. Range 03/11/2020 23:06  Sample type Unknown ARTERIAL  pH, Arterial Latest Ref Range: 7.35 - 7.45  7.292 (L)  pCO2 arterial Latest Ref Range: 32 - 48 mmHg 41.7  pO2, Arterial Latest Ref Range: 83 - 108 mmHg 504 (H)  TCO2 Latest Ref Range: 22 - 32 mmol/L 21 (L)  Acid-base deficit Latest Ref Range: 0.0 - 2.0 mmol/L 6.0 (H)  Bicarbonate Latest Ref Range: 20.0 - 28.0 mmol/L 20.2  O2 Saturation Latest Units: % 100.0  Collection site Unknown Brachial

## 2020-03-11 NOTE — Procedures (Signed)
Intubation Procedure Note  Anthony Macias  [id number]  [birth year]  Date:03/11/20  Time:10:03 PM   Provider Performing:Anthony Macias Anthony Macias    Procedure: Intubation (31500)  Indication(s) Respiratory Failure  Consent Unable to obtain consent due to emergent nature of procedure.   Anesthesia    Time Out Verified patient identification, verified procedure, site/side was marked, verified correct patient position, special equipment/implants available, medications/allergies/relevant history reviewed, required imaging and test results available.   Sterile Technique Usual hand hygeine, masks, and gloves were used   Procedure Description Patient positioned in bed supine.  Sedation given as noted above.  Patient was intubated with endotracheal tube using Glidescope.  View was Grade 1 full glottis .  Number of attempts was 1.  Colorimetric CO2 detector was consistent with tracheal placement.   Complications/Tolerance None; patient tolerated the procedure well. Chest X-ray is ordered to verify placement.   EBL    Specimen(s) None  Anthony Macias Anthony Macias RRT, RCP
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
## 2020-03-12 ENCOUNTER — Other Ambulatory Visit: Payer: Self-pay

## 2020-03-12 ENCOUNTER — Inpatient Hospital Stay (HOSPITAL_COMMUNITY): Payer: 59 | Admitting: Anesthesiology

## 2020-03-12 ENCOUNTER — Inpatient Hospital Stay (HOSPITAL_COMMUNITY): Payer: 59

## 2020-03-12 ENCOUNTER — Encounter (HOSPITAL_COMMUNITY): Admission: EM | Disposition: A | Discharge status: Rehabilitation Facility | Payer: Self-pay | Source: Home / Self Care

## 2020-03-12 HISTORY — PX: ORIF ANKLE FRACTURE: SHX5408

## 2020-03-12 HISTORY — PX: SACRO-ILIAC PINNING: SHX5050

## 2020-03-12 HISTORY — PX: IRRIGATION AND DEBRIDEMENT ELBOW: SHX6886

## 2020-03-12 HISTORY — PX: OPEN REDUCTION INTERNAL FIXATION (ORIF) DISTAL RADIAL FRACTURE: SHX5989

## 2020-03-12 HISTORY — PX: ORIF FEMUR FRACTURE: SHX2119

## 2020-03-12 LAB — POCT I-STAT 7, (LYTES, BLD GAS, ICA,H+H)
Acid-base deficit: 2 mmol/L (ref 0.0–2.0)
Bicarbonate: 24.3 mmol/L (ref 20.0–28.0)
Calcium, Ion: 0.96 mmol/L — ABNORMAL LOW (ref 1.15–1.40)
HCT: 26 % — ABNORMAL LOW (ref 39.0–52.0)
Hemoglobin: 8.8 g/dL — ABNORMAL LOW (ref 13.0–17.0)
O2 Saturation: 100 %
Patient temperature: 34.7
Potassium: 4.8 mmol/L (ref 3.5–5.1)
Sodium: 142 mmol/L (ref 135–145)
TCO2: 26 mmol/L (ref 22–32)
pCO2 arterial: 40.8 mmHg (ref 32.0–48.0)
pH, Arterial: 7.372 (ref 7.350–7.450)
pO2, Arterial: 207 mmHg — ABNORMAL HIGH (ref 83.0–108.0)

## 2020-03-12 LAB — CBC
HCT: 39.8 % (ref 39.0–52.0)
HCT: 35.3 % — ABNORMAL LOW (ref 39.0–52.0)
Hemoglobin: 14.3 g/dL (ref 13.0–17.0)
Hemoglobin: 12.5 g/dL — ABNORMAL LOW (ref 13.0–17.0)
MCH: 32.4 pg (ref 26.0–34.0)
MCH: 31.9 pg (ref 26.0–34.0)
MCHC: 35.9 g/dL (ref 30.0–36.0)
MCHC: 35.4 g/dL (ref 30.0–36.0)
MCV: 90.2 fL (ref 80.0–100.0)
MCV: 90.1 fL (ref 80.0–100.0)
Platelets: 310 K/uL (ref 150–400)
Platelets: 286 K/uL (ref 150–400)
RBC: 4.41 MIL/uL (ref 4.22–5.81)
RBC: 3.92 MIL/uL — ABNORMAL LOW (ref 4.22–5.81)
RDW: 13.8 % (ref 11.5–15.5)
RDW: 13.7 % (ref 11.5–15.5)
WBC: 11 K/uL — ABNORMAL HIGH (ref 4.0–10.5)
WBC: 15.6 K/uL — ABNORMAL HIGH (ref 4.0–10.5)
nRBC: 0 % (ref 0.0–0.2)
nRBC: 0 % (ref 0.0–0.2)

## 2020-03-12 LAB — URINALYSIS, ROUTINE W REFLEX MICROSCOPIC
Bacteria, UA: NONE SEEN
Bilirubin Urine: NEGATIVE
Glucose, UA: NEGATIVE mg/dL
Ketones, ur: NEGATIVE mg/dL
Leukocytes,Ua: NEGATIVE
Nitrite: NEGATIVE
Protein, ur: 30 mg/dL — AB
Specific Gravity, Urine: 1.043 — ABNORMAL HIGH (ref 1.005–1.030)
pH: 5 (ref 5.0–8.0)

## 2020-03-12 LAB — COMPREHENSIVE METABOLIC PANEL WITH GFR
ALT: 104 U/L — ABNORMAL HIGH (ref 0–44)
ALT: 96 U/L — ABNORMAL HIGH (ref 0–44)
AST: 213 U/L — ABNORMAL HIGH (ref 15–41)
AST: 228 U/L — ABNORMAL HIGH (ref 15–41)
Albumin: 4.1 g/dL (ref 3.5–5.0)
Albumin: 3.6 g/dL (ref 3.5–5.0)
Alkaline Phosphatase: 60 U/L (ref 38–126)
Alkaline Phosphatase: 51 U/L (ref 38–126)
Anion gap: 15 (ref 5–15)
Anion gap: 16 — ABNORMAL HIGH (ref 5–15)
BUN: 12 mg/dL (ref 6–20)
BUN: 13 mg/dL (ref 6–20)
CO2: 18 mmol/L — ABNORMAL LOW (ref 22–32)
CO2: 18 mmol/L — ABNORMAL LOW (ref 22–32)
Calcium: 8.2 mg/dL — ABNORMAL LOW (ref 8.9–10.3)
Calcium: 7.7 mg/dL — ABNORMAL LOW (ref 8.9–10.3)
Chloride: 105 mmol/L (ref 98–111)
Chloride: 104 mmol/L (ref 98–111)
Creatinine, Ser: 0.97 mg/dL (ref 0.61–1.24)
Creatinine, Ser: 1.13 mg/dL (ref 0.61–1.24)
GFR calc Af Amer: >60 mL/min (ref >60)
GFR calc Af Amer: >60 mL/min (ref >60)
GFR calc non Af Amer: >60 mL/min (ref >60)
GFR calc non Af Amer: >60 mL/min (ref >60)
Glucose, Bld: 103 mg/dL — ABNORMAL HIGH (ref 70–99)
Glucose, Bld: 111 mg/dL — ABNORMAL HIGH (ref 70–99)
Potassium: 3.7 mmol/L (ref 3.5–5.1)
Potassium: 3.5 mmol/L (ref 3.5–5.1)
Sodium: 138 mmol/L (ref 135–145)
Sodium: 138 mmol/L (ref 135–145)
Total Bilirubin: 0.8 mg/dL (ref 0.3–1.2)
Total Bilirubin: 0.6 mg/dL (ref 0.3–1.2)
Total Protein: 7.3 g/dL (ref 6.5–8.1)
Total Protein: 6 g/dL — ABNORMAL LOW (ref 6.5–8.1)

## 2020-03-12 LAB — HIV ANTIBODY (ROUTINE TESTING W REFLEX): HIV Screen 4th Generation wRfx: NONREACTIVE

## 2020-03-12 LAB — TRAUMA TEG PANEL
CFF Max Amplitude: 15.8 mm (ref 15–32)
Citrated Kaolin (R): 4.6 min (ref 4.6–9.1)
Citrated Rapid TEG (MA): 57.9 mm (ref 52–70)
Lysis at 30 Minutes: 0 % (ref 0.0–2.6)

## 2020-03-12 LAB — MRSA PCR SCREENING: MRSA by PCR: NEGATIVE

## 2020-03-12 LAB — TRIGLYCERIDES: Triglycerides: 113 mg/dL (ref <150)

## 2020-03-12 LAB — PROTIME-INR
INR: 1.2 (ref 0.8–1.2)
Prothrombin Time: 14.6 s (ref 11.4–15.2)

## 2020-03-12 LAB — LACTIC ACID, PLASMA
Lactic Acid, Venous: 5.3 mmol/L (ref 0.5–1.9)
Lactic Acid, Venous: 1.8 mmol/L (ref 0.5–1.9)

## 2020-03-12 LAB — SARS CORONAVIRUS 2 BY RT PCR (HOSPITAL ORDER, PERFORMED IN ~~LOC~~ HOSPITAL LAB): SARS Coronavirus 2: POSITIVE — AB

## 2020-03-12 LAB — ABO/RH: ABO/RH(D): O POS

## 2020-03-12 LAB — ETHANOL: Alcohol, Ethyl (B): 336 mg/dL (ref <10)

## 2020-03-12 LAB — PREPARE RBC (CROSSMATCH)

## 2020-03-12 SURGERY — PINNING, SACROILIAC JOINT, PERCUTANEOUS
Anesthesia: General | Site: Pelvis | Laterality: Right

## 2020-03-12 MED ORDER — LACTATED RINGERS IV SOLN: INTRAVENOUS | Status: DC | PRN

## 2020-03-12 MED ORDER — CHLORHEXIDINE GLUCONATE CLOTH 2 % EX PADS: 6.0000 | MEDICATED_PAD | Freq: Every day | CUTANEOUS | Status: DC

## 2020-03-12 MED ORDER — FENTANYL BOLUS VIA INFUSION
50.0000 ug | INTRAVENOUS | Status: DC | PRN
  Filled 2020-03-12: qty 50

## 2020-03-12 MED ORDER — CHLORHEXIDINE GLUCONATE 4 % EX LIQD
60.0000 mL | Freq: Once | CUTANEOUS | Status: DC
  Filled 2020-03-12: qty 60

## 2020-03-12 MED ORDER — POLYETHYLENE GLYCOL 3350 17 G PO PACK: 17.0000 g | PACK | Freq: Every day | ORAL | Status: DC

## 2020-03-12 MED ORDER — ROCURONIUM BROMIDE 10 MG/ML (PF) SYRINGE
PREFILLED_SYRINGE | INTRAVENOUS | Status: DC | PRN
  Administered 2020-03-12: 30 mg via INTRAVENOUS
  Administered 2020-03-12: 100 mg via INTRAVENOUS
  Administered 2020-03-12: 30 mg via INTRAVENOUS
  Administered 2020-03-12: 70 mg via INTRAVENOUS
  Administered 2020-03-12: 50 mg via INTRAVENOUS
  Administered 2020-03-12: 20 mg via INTRAVENOUS

## 2020-03-12 MED ORDER — ACETAMINOPHEN 160 MG/5ML PO SOLN
650.0000 mg | Freq: Four times a day (QID) | ORAL | Status: DC | PRN
  Administered 2020-03-12 – 2020-03-23 (×18): 650 mg
  Filled 2020-03-12 (×18): qty 20.3

## 2020-03-12 MED ORDER — ALBUMIN HUMAN 5 % IV SOLN
25.0000 g | Freq: Once | INTRAVENOUS | Status: AC
  Administered 2020-03-12: 25 g via INTRAVENOUS
  Filled 2020-03-12: qty 500

## 2020-03-12 MED ORDER — PROPOFOL 10 MG/ML IV BOLUS
INTRAVENOUS | Status: AC
  Filled 2020-03-12: qty 20

## 2020-03-12 MED ORDER — PROPOFOL 10 MG/ML IV BOLUS
INTRAVENOUS | Status: DC | PRN
  Administered 2020-03-12: 80 mg via INTRAVENOUS

## 2020-03-12 MED ORDER — DOCUSATE SODIUM 50 MG/5ML PO LIQD
100.0000 mg | Freq: Two times a day (BID) | ORAL | Status: DC
  Filled 2020-03-12 (×2): qty 10

## 2020-03-12 MED ORDER — MIDAZOLAM HCL 2 MG/2ML IJ SOLN
INTRAMUSCULAR | Status: AC
  Filled 2020-03-12: qty 2

## 2020-03-12 MED ORDER — PHENYLEPHRINE HCL-NACL 10-0.9 MG/250ML-% IV SOLN
0.0000 ug/min | INTRAVENOUS | Status: DC
  Administered 2020-03-12: 65 ug/min via INTRAVENOUS
  Administered 2020-03-12: 60 ug/min via INTRAVENOUS
  Administered 2020-03-12: 65 ug/min via INTRAVENOUS
  Administered 2020-03-12 – 2020-03-13 (×2): 20 ug/min via INTRAVENOUS
  Administered 2020-03-13: 60 ug/min via INTRAVENOUS
  Administered 2020-03-13: 50 ug/min via INTRAVENOUS
  Filled 2020-03-12 (×7): qty 250

## 2020-03-12 MED ORDER — ROCURONIUM BROMIDE 10 MG/ML (PF) SYRINGE
PREFILLED_SYRINGE | INTRAVENOUS | Status: AC
  Filled 2020-03-12: qty 20

## 2020-03-12 MED ORDER — MIDAZOLAM HCL 5 MG/5ML IJ SOLN
INTRAMUSCULAR | Status: DC | PRN
  Administered 2020-03-12: 2 mg via INTRAVENOUS

## 2020-03-12 MED ORDER — ROCURONIUM BROMIDE 10 MG/ML (PF) SYRINGE
PREFILLED_SYRINGE | INTRAVENOUS | Status: AC
  Filled 2020-03-12: qty 10

## 2020-03-12 MED ORDER — FENTANYL CITRATE (PF) 250 MCG/5ML IJ SOLN
INTRAMUSCULAR | Status: DC | PRN
  Administered 2020-03-12: 50 ug via INTRAVENOUS

## 2020-03-12 MED ORDER — CEFAZOLIN SODIUM-DEXTROSE 2-4 GM/100ML-% IV SOLN
2.0000 g | INTRAVENOUS | Status: AC
  Administered 2020-03-12 (×2): 2 g via INTRAVENOUS
  Filled 2020-03-12: qty 100

## 2020-03-12 MED ORDER — ETOMIDATE 2 MG/ML IV SOLN
INTRAVENOUS | Status: AC | PRN
  Administered 2020-03-11: 20 mg via INTRAVENOUS

## 2020-03-12 MED ORDER — 0.9 % SODIUM CHLORIDE (POUR BTL) OPTIME
TOPICAL | Status: DC | PRN
  Administered 2020-03-12 (×2): 1000 mL

## 2020-03-12 MED ORDER — PROPOFOL 1000 MG/100ML IV EMUL
INTRAVENOUS | Status: AC
  Filled 2020-03-12: qty 100

## 2020-03-12 MED ORDER — SODIUM CHLORIDE 0.9% IV SOLUTION: Freq: Once | INTRAVENOUS | Status: DC

## 2020-03-12 MED ORDER — SODIUM CHLORIDE 0.9 % IV SOLN: INTRAVENOUS | Status: DC | PRN

## 2020-03-12 MED ORDER — FENTANYL CITRATE (PF) 100 MCG/2ML IJ SOLN
50.0000 ug | Freq: Once | INTRAMUSCULAR | Status: AC
  Administered 2020-03-12: 50 ug via INTRAVENOUS

## 2020-03-12 MED ORDER — PROPOFOL 1000 MG/100ML IV EMUL
5.0000 ug/kg/min | INTRAVENOUS | Status: DC
  Administered 2020-03-12: 40 ug/kg/min via INTRAVENOUS
  Administered 2020-03-12: 20 ug/kg/min via INTRAVENOUS
  Filled 2020-03-12 (×2): qty 100

## 2020-03-12 MED ORDER — IOHEXOL 350 MG/ML SOLN
80.0000 mL | Freq: Once | INTRAVENOUS | Status: AC | PRN
  Administered 2020-03-12: 80 mL via INTRAVENOUS

## 2020-03-12 MED ORDER — POVIDONE-IODINE 10 % EX SWAB: 2.0000 "application " | Freq: Once | CUTANEOUS | Status: DC

## 2020-03-12 MED ORDER — DEXAMETHASONE SODIUM PHOSPHATE 10 MG/ML IJ SOLN
INTRAMUSCULAR | Status: DC | PRN
  Administered 2020-03-12: 5 mg via INTRAVENOUS

## 2020-03-12 MED ORDER — FENTANYL 2500MCG IN NS 250ML (10MCG/ML) PREMIX INFUSION
50.0000 ug/h | INTRAVENOUS | Status: DC
  Administered 2020-03-12: 100 ug/h via INTRAVENOUS
  Administered 2020-03-13: 125 ug/h via INTRAVENOUS
  Administered 2020-03-14 – 2020-03-15 (×2): 100 ug/h via INTRAVENOUS
  Administered 2020-03-15 – 2020-03-17 (×4): 200 ug/h via INTRAVENOUS
  Administered 2020-03-18: 175 ug/h via INTRAVENOUS
  Administered 2020-03-18: 150 ug/h via INTRAVENOUS
  Administered 2020-03-19 – 2020-03-20 (×2): 75 ug/h via INTRAVENOUS
  Filled 2020-03-12 (×15): qty 250

## 2020-03-12 MED ORDER — SODIUM CHLORIDE 0.9 % IV BOLUS
1000.0000 mL | Freq: Once | INTRAVENOUS | Status: AC
  Administered 2020-03-12: 1000 mL via INTRAVENOUS

## 2020-03-12 MED ORDER — CHLORHEXIDINE GLUCONATE 0.12% ORAL RINSE (MEDLINE KIT)
15.0000 mL | Freq: Two times a day (BID) | OROMUCOSAL | Status: DC
  Administered 2020-03-12 – 2020-05-04 (×80): 15 mL via OROMUCOSAL

## 2020-03-12 MED ORDER — FENTANYL CITRATE (PF) 250 MCG/5ML IJ SOLN
INTRAMUSCULAR | Status: AC
  Filled 2020-03-12: qty 5

## 2020-03-12 MED ORDER — PHENYLEPHRINE HCL-NACL 10-0.9 MG/250ML-% IV SOLN
INTRAVENOUS | Status: AC
  Filled 2020-03-12: qty 250

## 2020-03-12 MED ORDER — DEXAMETHASONE SODIUM PHOSPHATE 10 MG/ML IJ SOLN
INTRAMUSCULAR | Status: AC
  Filled 2020-03-12: qty 1

## 2020-03-12 MED ORDER — ONDANSETRON HCL 4 MG/2ML IJ SOLN
INTRAMUSCULAR | Status: AC
  Filled 2020-03-12: qty 2

## 2020-03-12 MED ORDER — PROPOFOL 1000 MG/100ML IV EMUL
0.0000 ug/kg/min | INTRAVENOUS | Status: DC
  Administered 2020-03-12 – 2020-03-13 (×2): 40 ug/kg/min via INTRAVENOUS
  Administered 2020-03-13: 30 ug/kg/min via INTRAVENOUS
  Administered 2020-03-14: 20 ug/kg/min via INTRAVENOUS
  Administered 2020-03-14: 10 ug/kg/min via INTRAVENOUS
  Administered 2020-03-15: 30 ug/kg/min via INTRAVENOUS
  Administered 2020-03-15: 20 ug/kg/min via INTRAVENOUS
  Administered 2020-03-16 (×2): 40 ug/kg/min via INTRAVENOUS
  Administered 2020-03-16 (×2): 30 ug/kg/min via INTRAVENOUS
  Administered 2020-03-17: 40 ug/kg/min via INTRAVENOUS
  Administered 2020-03-17: 25 ug/kg/min via INTRAVENOUS
  Administered 2020-03-17: 30 ug/kg/min via INTRAVENOUS
  Administered 2020-03-17: 40 ug/kg/min via INTRAVENOUS
  Administered 2020-03-17: 50 ug/kg/min via INTRAVENOUS
  Administered 2020-03-18: 25 ug/kg/min via INTRAVENOUS
  Administered 2020-03-18: 30 ug/kg/min via INTRAVENOUS
  Administered 2020-03-18 – 2020-03-19 (×2): 20 ug/kg/min via INTRAVENOUS
  Administered 2020-03-19: 10 ug/kg/min via INTRAVENOUS
  Administered 2020-03-19: 20 ug/kg/min via INTRAVENOUS
  Administered 2020-03-20 (×2): 15 ug/kg/min via INTRAVENOUS
  Filled 2020-03-12 (×27): qty 100

## 2020-03-12 MED ORDER — ROCURONIUM BROMIDE 50 MG/5ML IV SOLN
INTRAVENOUS | Status: AC | PRN
  Administered 2020-03-11: 100 mg via INTRAVENOUS

## 2020-03-12 MED ORDER — ALBUMIN HUMAN 5 % IV SOLN: 12.5000 g | Freq: Once | INTRAVENOUS | Status: DC

## 2020-03-12 MED ORDER — ORAL CARE MOUTH RINSE
15.0000 mL | OROMUCOSAL | Status: DC
  Administered 2020-03-12 – 2020-03-21 (×91): 15 mL via OROMUCOSAL

## 2020-03-12 SURGICAL SUPPLY — 158 items
BANDAGE ESMARK 6X9 LF (GAUZE/BANDAGES/DRESSINGS) ×7 IMPLANT
BENZOIN TINCTURE PRP APPL 2/3 (GAUZE/BANDAGES/DRESSINGS) ×10 IMPLANT
BIT DRILL 2.4X140 LONG SOLID (BIT) ×4 IMPLANT
BIT DRILL 2.8 QUICK RELEASE (BIT) ×2 IMPLANT
BIT DRILL 4.3 (BIT) ×10 IMPLANT
BIT DRILL 4.3MM (BIT) ×1
BIT DRILL 4.3X300MM (BIT) ×2 IMPLANT
BIT DRILL 4.8X300 (BIT) ×3 IMPLANT
BIT DRILL 4.8X300MM (BIT) ×1
BIT DRILL LONG 3.3 (BIT) ×3 IMPLANT
BIT DRILL LONG 3.3MM (BIT) ×1
BIT DRILL OD SOLI 3.5X110 DISP (DRILL) ×2 IMPLANT
BIT DRILL SOLID 2.0 X 110MM (DRILL) ×2 IMPLANT
BLADE CLIPPER SURG (BLADE) IMPLANT
BLADE SURG 10 STRL SS (BLADE) ×8 IMPLANT
BNDG COHESIVE 6X5 TAN STRL LF (GAUZE/BANDAGES/DRESSINGS) IMPLANT
BNDG ELASTIC 3X5.8 VLCR STR LF (GAUZE/BANDAGES/DRESSINGS) ×4 IMPLANT
BNDG ELASTIC 4X5.8 VLCR STR LF (GAUZE/BANDAGES/DRESSINGS) ×13 IMPLANT
BNDG ELASTIC 6X5.8 VLCR STR LF (GAUZE/BANDAGES/DRESSINGS) ×9 IMPLANT
BNDG ESMARK 6X9 LF (GAUZE/BANDAGES/DRESSINGS) ×9
BNDG GAUZE ELAST 4 BULKY (GAUZE/BANDAGES/DRESSINGS) ×22 IMPLANT
BRUSH SCRUB EZ PLAIN DRY (MISCELLANEOUS) ×18 IMPLANT
CANISTER SUCT 3000ML PPV (MISCELLANEOUS) ×13 IMPLANT
CAP LOCK NCB (Cap) ×24 IMPLANT
CLOSURE WOUND 1/2 X4 (GAUZE/BANDAGES/DRESSINGS)
COVER MAYO STAND STRL (DRAPES) ×5 IMPLANT
COVER SURGICAL LIGHT HANDLE (MISCELLANEOUS) ×18 IMPLANT
COVER WAND RF STERILE (DRAPES) ×5 IMPLANT
CUFF TOURN SGL QUICK 18X4 (TOURNIQUET CUFF) IMPLANT
CUFF TOURN SGL QUICK 34 (TOURNIQUET CUFF)
CUFF TRNQT CYL 34X4.125X (TOURNIQUET CUFF) ×5 IMPLANT
DRAIN PENROSE 1/4X12 LTX STRL (WOUND CARE) ×4 IMPLANT
DRAPE C-ARM 42X72 X-RAY (DRAPES) ×9 IMPLANT
DRAPE C-ARMOR (DRAPES) ×13 IMPLANT
DRAPE EXTREMITY ABC'S (DRAPES) ×1
DRAPE EXTREMITY ABCS (DRAPES) ×3 IMPLANT
DRAPE HALF SHEET 40X57 (DRAPES) ×5 IMPLANT
DRAPE IMP U-DRAPE 54X76 (DRAPES) ×9 IMPLANT
DRAPE INCISE IOBAN 66X45 STRL (DRAPES) ×5 IMPLANT
DRAPE LAPAROTOMY TRNSV 102X78 (DRAPES) ×9 IMPLANT
DRAPE ORTHO SPLIT 77X108 STRL (DRAPES) ×4
DRAPE SURG 17X11 SM STRL (DRAPES) ×10 IMPLANT
DRAPE SURG ORHT 6 SPLT 77X108 (DRAPES) ×19 IMPLANT
DRAPE U-SHAPE 47X51 STRL (DRAPES) ×18 IMPLANT
DRILL 2.8 QUICK RELEASE (BIT) ×9
DRILL BIT 4.3 (BIT) ×2
DRILL OD SOLID 3.5X110 DISP (DRILL) ×9
DRILL SOLID 2.0 X 110MM (DRILL) ×9
DRSG ADAPTIC 3X8 NADH LF (GAUZE/BANDAGES/DRESSINGS) ×5 IMPLANT
DRSG EMULSION OIL 3X3 NADH (GAUZE/BANDAGES/DRESSINGS) IMPLANT
DRSG MEPILEX BORDER 4X4 (GAUZE/BANDAGES/DRESSINGS) ×13 IMPLANT
DRSG MEPILEX BORDER 4X8 (GAUZE/BANDAGES/DRESSINGS) ×4 IMPLANT
DRSG MEPITEL 4X7.2 (GAUZE/BANDAGES/DRESSINGS) ×8 IMPLANT
DRSG PAD ABDOMINAL 8X10 ST (GAUZE/BANDAGES/DRESSINGS) ×28 IMPLANT
ELECT REM PT RETURN 9FT ADLT (ELECTROSURGICAL) ×9
ELECTRODE REM PT RTRN 9FT ADLT (ELECTROSURGICAL) ×7 IMPLANT
EVACUATOR 1/8 PVC DRAIN (DRAIN) IMPLANT
EVACUATOR 3/16  PVC DRAIN (DRAIN)
EVACUATOR 3/16 PVC DRAIN (DRAIN) IMPLANT
GAUZE SPONGE 4X4 12PLY STRL (GAUZE/BANDAGES/DRESSINGS) ×18 IMPLANT
GAUZE SPONGE 4X4 12PLY STRL LF (GAUZE/BANDAGES/DRESSINGS) ×4 IMPLANT
GLOVE BIO SURGEON STRL SZ7.5 (GLOVE) ×25 IMPLANT
GLOVE BIO SURGEON STRL SZ8 (GLOVE) ×29 IMPLANT
GLOVE BIOGEL PI IND STRL 7.5 (GLOVE) ×11 IMPLANT
GLOVE BIOGEL PI IND STRL 8 (GLOVE) ×9 IMPLANT
GLOVE BIOGEL PI INDICATOR 7.5 (GLOVE) ×6
GLOVE BIOGEL PI INDICATOR 8 (GLOVE) ×4
GOWN STRL REUS W/ TWL LRG LVL3 (GOWN DISPOSABLE) ×24 IMPLANT
GOWN STRL REUS W/ TWL XL LVL3 (GOWN DISPOSABLE) ×9 IMPLANT
GOWN STRL REUS W/TWL 2XL LVL3 (GOWN DISPOSABLE) ×9 IMPLANT
GOWN STRL REUS W/TWL LRG LVL3 (GOWN DISPOSABLE) ×14
GOWN STRL REUS W/TWL XL LVL3 (GOWN DISPOSABLE) ×4
GUIDE PIN DRILL TIP 2.8X450HIP (PIN) ×9
K-WIRE 2.0 (WIRE) ×8
K-WIRE FXSTD 280X2XNS SS (WIRE) ×28
K-WIRE SURGICAL 1.6X102 (WIRE) ×8 IMPLANT
KIT BASIN OR (CUSTOM PROCEDURE TRAY) ×9 IMPLANT
KIT TURNOVER KIT B (KITS) ×9 IMPLANT
KWIRE FXSTD 280X2XNS SS (WIRE) ×8 IMPLANT
LOOP VESSEL MAXI BLUE (MISCELLANEOUS) IMPLANT
MANIFOLD NEPTUNE II (INSTRUMENTS) ×5 IMPLANT
NDL HYPO 21X1.5 SAFETY (NEEDLE) IMPLANT
NEEDLE 22X1 1/2 (OR ONLY) (NEEDLE) IMPLANT
NEEDLE HYPO 21X1.5 SAFETY (NEEDLE) IMPLANT
NS IRRIG 1000ML POUR BTL (IV SOLUTION) ×13 IMPLANT
PACK GENERAL/GYN (CUSTOM PROCEDURE TRAY) ×5 IMPLANT
PACK ORTHO EXTREMITY (CUSTOM PROCEDURE TRAY) ×5 IMPLANT
PACK TOTAL JOINT (CUSTOM PROCEDURE TRAY) ×9 IMPLANT
PACK UNIVERSAL I (CUSTOM PROCEDURE TRAY) ×5 IMPLANT
PAD ABD 8X10 STRL (GAUZE/BANDAGES/DRESSINGS) ×12 IMPLANT
PAD ARMBOARD 7.5X6 YLW CONV (MISCELLANEOUS) ×18 IMPLANT
PAD CAST 4YDX4 CTTN HI CHSV (CAST SUPPLIES) ×9 IMPLANT
PADDING CAST COTTON 4X4 STRL (CAST SUPPLIES) ×4
PADDING CAST COTTON 6X4 STRL (CAST SUPPLIES) ×14 IMPLANT
PENCIL BUTTON BLDE SNGL 10FT (ELECTRODE) ×8 IMPLANT
PIN GUIDE DRILL TIP 2.8X450HIP (PIN) ×2 IMPLANT
PLATE 8 HOLE RADIUS (Plate) ×4 IMPLANT
PLATE BONE LOCK 238MM 9HOLE (Plate) ×4 IMPLANT
PLATE FIB CL 9H LT (Plate) ×4 IMPLANT
PLATE TIBIA PREC 7H (Plate) ×4 IMPLANT
SCREW 3.5X16 NONLOCKING (Screw) ×8 IMPLANT
SCREW 3.5X26 NONLOCKING (Screw) ×4 IMPLANT
SCREW 3.5X32 NONLOCKING (Screw) ×4 IMPLANT
SCREW 5.0 80MM (Screw) ×12 IMPLANT
SCREW CANN 8.0X130X16 (Screw) ×4 IMPLANT
SCREW HEXALOBE NON-LOCK 3.5X14 (Screw) ×20 IMPLANT
SCREW LOCK 3 3.5X18 (Screw) ×4 IMPLANT
SCREW LOCK PLATE R3 2.7X14 (Screw) ×4 IMPLANT
SCREW N/L PLATE 3.5X42 (Screw) ×4 IMPLANT
SCREW NCB 3.5X75X5X6.2XST (Screw) ×2 IMPLANT
SCREW NCB 4.0MX34M (Screw) ×4 IMPLANT
SCREW NCB 5.0X36MM (Screw) ×8 IMPLANT
SCREW NCB 5.0X38 (Screw) ×4 IMPLANT
SCREW NCB 5.0X75MM (Screw) ×2 IMPLANT
SCREW NCB 5.0X85MM (Screw) ×4 IMPLANT
SCREW NL PLATE 3.5X50 (Screw) ×8 IMPLANT
SCREW NON LOCKING 2.7X20 (Screw) ×4 IMPLANT
SCREW NON LOCKING 3.5X14 (Screw) ×8 IMPLANT
SCREW NON LOCKING PLATE 2.7X14 (Screw) ×4 IMPLANT
SCREW NONLOCK HEX 3.5X12 (Screw) ×4 IMPLANT
SLING ARM FOAM STRAP XLG (SOFTGOODS) ×4 IMPLANT
SPLINT PLASTER EXTRA FAST 3X15 (CAST SUPPLIES) ×2
SPLINT PLASTER GYPS XFAST 3X15 (CAST SUPPLIES) ×2 IMPLANT
SPONGE LAP 18X18 RF (DISPOSABLE) ×9 IMPLANT
STAPLER VISISTAT 35W (STAPLE) ×9 IMPLANT
STOCKINETTE IMPERVIOUS LG (DRAPES) ×5 IMPLANT
STRIP CLOSURE SKIN 1/2X4 (GAUZE/BANDAGES/DRESSINGS) IMPLANT
SUCTION FRAZIER HANDLE 10FR (MISCELLANEOUS) ×2
SUCTION TUBE FRAZIER 10FR DISP (MISCELLANEOUS) ×7 IMPLANT
SUT ETHIBOND 5 LR DA (SUTURE) ×5 IMPLANT
SUT ETHILON 2 0 PSLX (SUTURE) ×36 IMPLANT
SUT ETHILON 3 0 PS 1 (SUTURE) ×10 IMPLANT
SUT FIBERWIRE #2 38 T-5 BLUE (SUTURE)
SUT PDS AB 2-0 CT1 27 (SUTURE) ×4 IMPLANT
SUT PROLENE 0 CT 2 (SUTURE) IMPLANT
SUT VIC AB 0 CT1 27 (SUTURE) ×4
SUT VIC AB 0 CT1 27XBRD ANBCTR (SUTURE) ×14 IMPLANT
SUT VIC AB 1 CT1 18XCR BRD 8 (SUTURE) ×2 IMPLANT
SUT VIC AB 1 CT1 27 (SUTURE) ×4
SUT VIC AB 1 CT1 27XBRD ANBCTR (SUTURE) ×14 IMPLANT
SUT VIC AB 1 CT1 8-18 (SUTURE) ×2
SUT VIC AB 2-0 CT1 27 (SUTURE) ×12
SUT VIC AB 2-0 CT1 TAPERPNT 27 (SUTURE) ×22 IMPLANT
SUT VIC AB 2-0 CT3 27 (SUTURE) IMPLANT
SUT VIC AB 2-0 FS1 27 (SUTURE) ×5 IMPLANT
SUTURE FIBERWR #2 38 T-5 BLUE (SUTURE) IMPLANT
SYR 20ML ECCENTRIC (SYRINGE) IMPLANT
SYR 5ML LL (SYRINGE) IMPLANT
TOWEL GREEN STERILE (TOWEL DISPOSABLE) ×18 IMPLANT
TOWEL GREEN STERILE FF (TOWEL DISPOSABLE) ×18 IMPLANT
TRAY FOLEY MTR SLVR 16FR STAT (SET/KITS/TRAYS/PACK) IMPLANT
TUBE CONNECTING 12'X1/4 (SUCTIONS)
TUBE CONNECTING 12X1/4 (SUCTIONS) ×5 IMPLANT
UNDERPAD 30X36 HEAVY ABSORB (UNDERPADS AND DIAPERS) ×9 IMPLANT
WASHER 8.0 (Orthopedic Implant) ×2 IMPLANT
WASHER CANN FLAT 8 (Orthopedic Implant) ×2 IMPLANT
WATER STERILE IRR 1000ML POUR (IV SOLUTION) ×10 IMPLANT
YANKAUER SUCT BULB TIP NO VENT (SUCTIONS) IMPLANT

## 2020-03-12 NOTE — Progress Notes (Signed)
Patients belongings at time of admission to 4N26:  Wallet with miscellaneous store cards/business cards- NO MONEY IN WALLET Grey and black fanny pack- wallet inside 1 pack of [place] cigarettes 1 pair of black shorts with brown belt 1 pack of Goodie Powders 1 black backpack- 2 white shirts, 1 round Tupperware container, 1 book, 1 black rubber bracelet, a container of barbeque sauce and hot sauce, 1 container of shaving cream, a bottle of lotion, a black hat, a bottle of isopropyl alcohol, and multiple pairs of underwear.
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
## 2020-03-12 NOTE — Progress Notes (Signed)
RT transported patient from 4N23 to CT and back with RN. No complications. RT will continue to monitor.  

## 2020-03-12 NOTE — Anesthesia Procedure Notes (Signed)
Central Venous Catheter Insertion Performed by: Kipp Brood, MD Start/End7/15/2021 5:50 PM, 03/12/2020 6:05 PM Position: Trendelenburg Hand hygiene performed , maximum sterile barriers used  and Seldinger technique used Catheter size: 12 Fr Total catheter length 15. Central line was placed.Triple lumen Procedure performed without using ultrasound guided technique. Ultrasound Notes:anatomy identified, needle tip was noted to be adjacent to the nerve/plexus identified and no ultrasound evidence of intravascular and/or intraneural injection Attempts: 1 Following insertion, line sutured, dressing applied and Biopatch. Post procedure assessment: blood return through all ports and free fluid flow  Patient tolerated the procedure well with no immediate complications.

## 2020-03-12 NOTE — OR Nursing (Signed)
Care of patient assumed at 1920. °

## 2020-03-12 NOTE — Anesthesia Procedure Notes (Signed)
Arterial Line Insertion Start/End7/15/2021 2:22 PM, 03/12/2020 2:25 PM Performed by: Kaylyn Layer, MD, anesthesiologist  Patient location: OR. Preanesthetic checklist: patient identified, IV checked, site marked, risks and benefits discussed, surgical consent, monitors and equipment checked, pre-op evaluation and timeout performed Emergency situation Patient sedated Left, radial was placed Catheter size: 20 Fr Hand hygiene performed  and maximum sterile barriers used   Attempts: 1 Procedure performed without using ultrasound guided technique. Following insertion, dressing applied. Post procedure assessment: normal and unchanged  Patient tolerated the procedure well with no immediate complications.

## 2020-03-12 NOTE — Consult Note (Signed)
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 Signed   By: Jonna Clark M.D.   On: 03/11/2020 22:33   DG Tibia/Fibula Right Port  Result Date: 03/11/2020 CLINICAL DATA:  Level 1 trauma. EXAM: PORTABLE RIGHT TIBIA AND FIBULA - 2 VIEW COMPARISON:  None. FINDINGS: Mildly displaced oblique fracture of the proximal fibular shaft. No tibial fracture. Knee and ankle alignment are grossly maintained. IMPRESSION: Mildly displaced oblique fracture of the proximal fibular shaft. Electronically Signed   By: Narda Rutherford M.D.   On: 03/11/2020 23:49   CT MAXILLOFACIAL WO CONTRAST  Result Date: 03/11/2020 CLINICAL DATA:  39 year old male with trauma. EXAM: CT HEAD WITHOUT CONTRAST CT MAXILLOFACIAL WITHOUT CONTRAST CT CERVICAL SPINE WITHOUT CONTRAST TECHNIQUE: Multidetector CT imaging of the head, cervical spine, and maxillofacial structures were performed using the standard protocol without intravenous contrast. Multiplanar CT image reconstructions of the cervical spine and maxillofacial structures were also generated. COMPARISON:  None. FINDINGS: CT HEAD FINDINGS Brain: There is intraparenchymal hemorrhage or hemorrhagic  contusion along the inferior right frontal lobe over the right orbital bone measuring approximately 1.9 x 1.3 cm. There is a small hemorrhagic contusion or possible subdural hemorrhage along the inferior frontal lobe over the cribriform plate. There is a right parietal subdural hemorrhage measuring approximately 6 mm in thickness (coronal 45/5). There are small hemorrhagic contusions of the left temporal lobe. There is a small focus of hemorrhagic contusion involving the anterior right frontal lobe (18/5). There is a  small right temporal subdural hemorrhage. No midline shift. Pneumocephalus secondary to extensive skull base fracture. Vascular: No hyperdense vessel or unexpected calcification. Skull: Nondisplaced fracture of the left middle cranial fossa through the left sphenoid wing. The fracture extends to the left temporal bone and inferior aspect of the left mastoid air cells. Comminuted and mildly displaced fractures of the right sphenoid wing with complex fractures of the right orbital walls including the lateral orbital wall as well as right orbital floor. There is nondisplaced fracture of the right frontal bone along the superolateral right orbit (26/7). There is comminuted and displaced fractures of the posterior right orbital roof. There is extraconal right orbital hematoma. There are displaced fractures of the anterior and posterolateral walls of the right maxillary sinus with right maxillary hemosinus. There is nondisplaced fracture of the left pterygoid plates. Nondisplaced fracture of the right mandibular ramus. Nondisplaced fracture of the right zygoma. Other: There is right periorbital laceration and hematoma with mild right exophthalmos. CT MAXILLOFACIAL FINDINGS Osseous: Extensive facial bone fractures involving the walls of the right orbit, right maxillary sinus, comminuted and mildly displaced fractures of the right sphenoid wing, nondisplaced fracture of the left sphenoid wing, nondisplaced  fracture of the left pterygoid plate, and nondisplaced fractures of the right zygoma and mandibular ramus as described above. Orbits: The globes are preserved. There is right orbital extraconal hematoma. There is abutment of the right inferior rectus muscle to the orbital floor fracture. Clinical correlation is recommended to exclude ocular entrapment. There is mild right exophthalmos. Sinuses: Right maxillary hemosinus. There is partial opacification of ethmoid air cells and air-fluid level within the left sphenoid sinus, also likely blood product. Soft tissues: Right facial and periorbital hematoma. CT CERVICAL SPINE FINDINGS Alignment: Normal. Skull base and vertebrae: No cervical spine fracture. Nondisplaced fracture of the tip of the right T1 transverse process at the costovertebral articulation (79/4). Nondisplaced fracture of the left T1 transverse process (78/10). Soft tissues and spinal canal: No prevertebral fluid or swelling. No visible canal hematoma. Disc levels:  No acute findings. No degenerative changes. Upper chest: Comminuted fracture of the right clavicular head. Other: An endotracheal and an enteric tube are partially visualized. IMPRESSION: 1. Extensive facial bone fractures with complex fractures of the right orbital walls and right maxillary sinus as well as fractures of the anterior and middle skull base as above. 2. Right orbital extraconal hematoma with mild right exophthalmos. There is abutment of the right inferior rectus muscle to the orbital floor fracture. Clinical correlation is recommended to exclude ocular entrapment. 3. Right parietal subdural hemorrhage and focal area of parenchymal contusion involving the inferior right frontal lobe as well as additional smaller contusions. No midline shift. 4. No acute/traumatic cervical spine pathology. 5. Nondisplaced fractures of the T1 transverse processes as well as comminuted fracture of the right clavicle. These results were called by  telephone at the time of interpretation on 03/11/2020 at 11:34 pm to Dr. Clayborne Dana, who verbally acknowledged these results. Electronically Signed   By: Elgie Collard M.D.   On: 03/11/2020 23:36    Review of Systems  Unable to perform ROS: Intubated   Blood pressure 106/75, pulse 88, resp. rate 18, height (S) 6' (1.829 m), weight 80 kg, SpO2 100 %. Physical Exam Vitals reviewed.  Constitutional:      Appearance: He is normal weight.  HENT:     Nose: Nose normal.     Mouth/Throat:     Mouth: Mucous membranes are moist.  Cardiovascular:  Rate and Rhythm: Normal rate.  Abdominal:     General: Abdomen is flat.  Skin:    General: Skin is warm.  Psychiatric:        Mood and Affect: Mood normal.   Ortho exam demonstrates some swelling medial aspect of the sternoclavicular joint on the right-hand side.  Shoulder is dislocated on the right-hand side with some swelling around the glenohumeral joint region.  On the left-hand side no tenderness or crepitus around the clavicle region.  Left shoulder is located and moves without coarse grinding.  Left elbow and wrist have good motion without crepitus or swelling.  Radial pulse trace palpable due to the patient's systolic pressure around 80.  On the right-hand side there is a laceration on the medial aspect of the olecranon which is more of a road rash type laceration.  Compartments are soft.  No grinding or crepitus with range of motion passively of the right wrist.  There is some coarseness and grinding with range of motion of the right shoulder which is dislocated.  Pelvis is stable to palpation.  Right lower extremity demonstrates multiple abrasions around the knee but there is no right knee effusion.  No coarse grinding with internal extra rotation of the right leg.  No swelling or coarseness or grinding around the right ankle joint.  Pedal pulses not palpable bilaterally due to low pressure but both feet are warm.  On the left-hand side there is  palpable deformity around the ankle and distal tibial region.  There is left knee effusion with some medial lateral instability to the knee consistent with either ligamentous injury or tibial plateau fracture.  Compartments are soft bilateral lower extremities.  No crepitus with left hip range of motion  Assessment/Plan: Impression is right shoulder dislocation with coracoid fracture on the right medial clavicle fracture on the right and greater tuberosity fracture on the right.  With the patient sedated the shoulder is reduced with traction and manipulation.  Dedicated CT scan of the shoulder pending for further  The elbow laceration does not communicate with any fracture.  But there is a forearm fracture with no evidence of distal radial ulnar joint dislocation.  Compartments soft in the right arm  Right-sided rami fractures present in the pelvis.  These are stable and should not require surgery  Left shoulder has nondisplaced glenoid fracture which should not require surgery  Left knee and left ankle have fractures which were not identified at the time of the initial survey.  Radiographs on the structures pending  Further fracture management per orthopedic trauma service fractures tonight will be splinted.    Marrianne MoodG Scott Riel Hirschman 03/12/2020, 1:31 AM

## 2020-03-12 NOTE — Anesthesia Procedure Notes (Signed)
Procedure Name: Intubation Date/Time: 03/12/2020 8:20 PM Performed by: Molli Hazard, CRNA Pre-anesthesia Checklist: Patient identified, Emergency Drugs available, Suction available and Patient being monitored Patient Re-evaluated:Patient Re-evaluated prior to induction Laryngoscope Size: Glidescope Tube type: Oral Tube size: 7.5 mm Number of attempts: 1 Airway Equipment and Method: Bougie stylet Placement Confirmation: ETT inserted through vocal cords under direct vision,  positive ETCO2 and breath sounds checked- equal and bilateral Secured at: 27 cm Tube secured with: Tape Dental Injury: Teeth and Oropharynx as per pre-operative assessment  Comments: ETT exchanged over Bougie stylet d/t burst cuff on existing ETT.
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
## 2020-03-12 NOTE — Progress Notes (Signed)
Orthopedic Tech Progress Note Patient Details:  Anthony Macias 1981-05-10 [id number] Spoke with RN patient will be going to OR around 12 so left shoulder immobilizer with RN. Ortho Devices Type of Ortho Device: Shoulder immobilizer Ortho Device/Splint Location: RUE Ortho Device/Splint Interventions: Ordered   Post Interventions Patient Tolerated: Other (comment) Instructions Provided: Other (comment)   Michelle Piper 03/12/2020, 11:06 AM

## 2020-03-12 NOTE — Progress Notes (Signed)
Orthopedic Tech Progress Note Patient Details:  Anthony Macias [birth year] [id number]  Ortho Devices Type of Ortho Device: Sugartong splint, Post (short leg) splint, Stirrup splint, Short leg splint Ortho Device/Splint Location: RUE, LLE Ortho Device/Splint Interventions: Application   Post Interventions Patient Tolerated: Well   Esme Freund E Linnell Swords 03/12/2020, 3:24 AM
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
## 2020-03-12 NOTE — Progress Notes (Signed)
Received phone call from patient's brother, Sony Schlarb.  [phone]
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
## 2020-03-12 NOTE — Anesthesia Postprocedure Evaluation (Signed)
Anesthesia Post Note  Patient: Anthony Macias  Procedure(s) Performed: SACRO-ILIAC PINNING (Bilateral Pelvis) OPEN REDUCTION INTERNAL FIXATION (ORIF) DISTAL FEMUR FRACTURE (Left Knee) OPEN REDUCTION INTERNAL FIXATION (ORIF) ANKLE FRACTURE (Left Ankle) OPEN REDUCTION INTERNAL FIXATION (ORIF) RADIAL shaft FRACTURE (Right Arm Lower) IRRIGATION AND DEBRIDEMENT ELBOW (Right Elbow)     Patient location during evaluation: SICU Anesthesia Type: General Level of consciousness: sedated Pain management: pain level controlled Vital Signs Assessment: post-procedure vital signs reviewed and stable Respiratory status: patient remains intubated per anesthesia plan Cardiovascular status: stable Postop Assessment: no apparent nausea or vomiting Anesthetic complications: no   No complications documented.  Last Vitals:  Vitals:   03/12/20 2042 03/12/20 2124  BP: (!) 140/94 (!) 191/109  Pulse: 60   Resp: 17   Temp:    SpO2: 100%     Last Pain:  Vitals:   03/12/20 1200  TempSrc: Axillary                 Shelton Silvas

## 2020-03-12 NOTE — Progress Notes (Signed)
No family or emergency contact information in chart. Noted IRC listed as address. Telephone call to [facility] [phone], verified that patient does have a mail box at [facility], but no emergency contact information listed. IRC to search records and will contact TOC if any information is found.   Hortencia Conradi, RN MSN CCM Transitions of Care [phone]
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
## 2020-03-12 NOTE — Progress Notes (Signed)
Right shoulder has been relocated confirmed on AP view.  Recommend CT scan to assess greater tuberosity fragment as well as glenoid fragment  Patient has bicondylar distal femur fracture on the left.  This will be splinted and addressed at a later time.  Patient has bimalleolar ankle fracture on the left.  This is a closed injury.  Will be splinted and addressed at a later time.

## 2020-03-12 NOTE — Transfer of Care (Signed)
Immediate Anesthesia Transfer of Care Note  Patient: Anthony Macias  Procedure(s) Performed: SACRO-ILIAC PINNING (Bilateral Pelvis) OPEN REDUCTION INTERNAL FIXATION (ORIF) DISTAL FEMUR FRACTURE (Left Knee) OPEN REDUCTION INTERNAL FIXATION (ORIF) ANKLE FRACTURE (Left Ankle) OPEN REDUCTION INTERNAL FIXATION (ORIF) RADIAL shaft FRACTURE (Right Arm Lower) IRRIGATION AND DEBRIDEMENT ELBOW (Right Elbow)  Patient Location: NICU  Anesthesia Type:General  Level of Consciousness: Patient remains intubated per anesthesia plan  Airway & Oxygen Therapy: Patient placed on Ventilator (see vital sign flow sheet for setting)  Post-op Assessment: Report given to RN and Post -op Vital signs reviewed and stable  Post vital signs: Reviewed and stable  Last Vitals:  Vitals Value Taken Time  BP 140/94 03/12/20 2042  Temp    Pulse 58 03/12/20 2056  Resp 18 03/12/20 2056  SpO2 100 % 03/12/20 2056  Vitals shown include unvalidated device data.  Last Pain:  Vitals:   03/12/20 1200  TempSrc: Axillary         Complications: No complications documented.

## 2020-03-13 ENCOUNTER — Encounter (HOSPITAL_COMMUNITY): Payer: Self-pay | Admitting: Orthopedic Surgery

## 2020-03-13 LAB — GLUCOSE, CAPILLARY
Glucose-Capillary: 120 mg/dL — ABNORMAL HIGH (ref 70–99)
Glucose-Capillary: 148 mg/dL — ABNORMAL HIGH (ref 70–99)
Glucose-Capillary: 163 mg/dL — ABNORMAL HIGH (ref 70–99)
Glucose-Capillary: 173 mg/dL — ABNORMAL HIGH (ref 70–99)

## 2020-03-13 LAB — CBC
HCT: 23.3 % — ABNORMAL LOW (ref 39.0–52.0)
HCT: 21.4 % — ABNORMAL LOW (ref 39.0–52.0)
Hemoglobin: 8.2 g/dL — ABNORMAL LOW (ref 13.0–17.0)
Hemoglobin: 7.3 g/dL — ABNORMAL LOW (ref 13.0–17.0)
MCH: 30.8 pg (ref 26.0–34.0)
MCH: 30.5 pg (ref 26.0–34.0)
MCHC: 35.2 g/dL (ref 30.0–36.0)
MCHC: 34.1 g/dL (ref 30.0–36.0)
MCV: 87.6 fL (ref 80.0–100.0)
MCV: 89.5 fL (ref 80.0–100.0)
Platelets: 132 K/uL — ABNORMAL LOW (ref 150–400)
Platelets: 120 K/uL — ABNORMAL LOW (ref 150–400)
RBC: 2.66 MIL/uL — ABNORMAL LOW (ref 4.22–5.81)
RBC: 2.39 MIL/uL — ABNORMAL LOW (ref 4.22–5.81)
RDW: 14.8 % (ref 11.5–15.5)
RDW: 15.1 % (ref 11.5–15.5)
WBC: 7.3 K/uL (ref 4.0–10.5)
WBC: 7.2 K/uL (ref 4.0–10.5)
nRBC: 0 % (ref 0.0–0.2)
nRBC: 0 % (ref 0.0–0.2)

## 2020-03-13 LAB — POCT I-STAT 7, (LYTES, BLD GAS, ICA,H+H)
Acid-base deficit: 3 mmol/L — ABNORMAL HIGH (ref 0.0–2.0)
Acid-base deficit: 3 mmol/L — ABNORMAL HIGH (ref 0.0–2.0)
Bicarbonate: 23.6 mmol/L (ref 20.0–28.0)
Bicarbonate: 22.9 mmol/L (ref 20.0–28.0)
Calcium, Ion: 1.1 mmol/L — ABNORMAL LOW (ref 1.15–1.40)
Calcium, Ion: 1.04 mmol/L — ABNORMAL LOW (ref 1.15–1.40)
HCT: 24 % — ABNORMAL LOW (ref 39.0–52.0)
HCT: 22 % — ABNORMAL LOW (ref 39.0–52.0)
Hemoglobin: 8.2 g/dL — ABNORMAL LOW (ref 13.0–17.0)
Hemoglobin: 7.5 g/dL — ABNORMAL LOW (ref 13.0–17.0)
O2 Saturation: 100 %
O2 Saturation: 100 %
Potassium: 4.2 mmol/L (ref 3.5–5.1)
Potassium: 4.3 mmol/L (ref 3.5–5.1)
Sodium: 144 mmol/L (ref 135–145)
Sodium: 143 mmol/L (ref 135–145)
TCO2: 25 mmol/L (ref 22–32)
TCO2: 24 mmol/L (ref 22–32)
pCO2 arterial: 51.9 mmHg — ABNORMAL HIGH (ref 32.0–48.0)
pCO2 arterial: 45.7 mmHg (ref 32.0–48.0)
pH, Arterial: 7.265 — ABNORMAL LOW (ref 7.350–7.450)
pH, Arterial: 7.308 — ABNORMAL LOW (ref 7.350–7.450)
pO2, Arterial: 249 mmHg — ABNORMAL HIGH (ref 83.0–108.0)
pO2, Arterial: 211 mmHg — ABNORMAL HIGH (ref 83.0–108.0)

## 2020-03-13 LAB — MAGNESIUM
Magnesium: 2 mg/dL (ref 1.7–2.4)
Magnesium: 2 mg/dL (ref 1.7–2.4)

## 2020-03-13 LAB — BASIC METABOLIC PANEL WITH GFR
Anion gap: 6 (ref 5–15)
BUN: 6 mg/dL (ref 6–20)
CO2: 22 mmol/L (ref 22–32)
Calcium: 7 mg/dL — ABNORMAL LOW (ref 8.9–10.3)
Chloride: 116 mmol/L — ABNORMAL HIGH (ref 98–111)
Creatinine, Ser: 0.85 mg/dL (ref 0.61–1.24)
GFR calc Af Amer: >60 mL/min (ref >60)
GFR calc non Af Amer: >60 mL/min (ref >60)
Glucose, Bld: 149 mg/dL — ABNORMAL HIGH (ref 70–99)
Potassium: 3.2 mmol/L — ABNORMAL LOW (ref 3.5–5.1)
Sodium: 144 mmol/L (ref 135–145)

## 2020-03-13 LAB — PHOSPHORUS
Phosphorus: <1 mg/dL — CL (ref 2.5–4.6)
Phosphorus: <1 mg/dL — CL (ref 2.5–4.6)

## 2020-03-13 MED ORDER — VITAL HIGH PROTEIN PO LIQD: 1000.0000 mL | ORAL | Status: DC

## 2020-03-13 MED ORDER — DOCUSATE SODIUM 50 MG/5ML PO LIQD
100.0000 mg | Freq: Two times a day (BID) | ORAL | Status: DC
  Administered 2020-03-13 – 2020-03-16 (×6): 100 mg
  Filled 2020-03-13 (×5): qty 10

## 2020-03-13 MED ORDER — SELENIUM 200 MCG PO TABS
200.0000 ug | ORAL_TABLET | Freq: Every day | ORAL | Status: AC
  Administered 2020-03-13 – 2020-03-19 (×7): 200 ug
  Filled 2020-03-13 (×8): qty 1

## 2020-03-13 MED ORDER — POTASSIUM PHOSPHATES 15 MMOLE/5ML IV SOLN
30.0000 mmol | Freq: Once | INTRAVENOUS | Status: AC
  Administered 2020-03-13: 30 mmol via INTRAVENOUS
  Filled 2020-03-13: qty 10

## 2020-03-13 MED ORDER — POLYETHYLENE GLYCOL 3350 17 G PO PACK
17.0000 g | PACK | Freq: Every day | ORAL | Status: DC
  Administered 2020-03-14 – 2020-03-26 (×8): 17 g
  Filled 2020-03-13 (×9): qty 1

## 2020-03-13 MED ORDER — ALBUMIN HUMAN 5 % IV SOLN
25.0000 g | Freq: Once | INTRAVENOUS | Status: AC
  Administered 2020-03-13: 25 g via INTRAVENOUS
  Filled 2020-03-13: qty 500

## 2020-03-13 MED ORDER — CHLORHEXIDINE GLUCONATE CLOTH 2 % EX PADS
6.0000 | MEDICATED_PAD | Freq: Every day | CUTANEOUS | Status: DC
  Administered 2020-03-14 – 2020-03-29 (×13): 6 via TOPICAL

## 2020-03-13 MED ORDER — PROSOURCE TF PO LIQD
45.0000 mL | Freq: Three times a day (TID) | ORAL | Status: DC
  Administered 2020-03-13 – 2020-03-24 (×31): 45 mL
  Filled 2020-03-13 (×35): qty 45

## 2020-03-13 MED ORDER — PROSOURCE TF PO LIQD
45.0000 mL | Freq: Two times a day (BID) | ORAL | Status: DC
  Filled 2020-03-13: qty 45

## 2020-03-13 MED ORDER — ENOXAPARIN SODIUM 30 MG/0.3ML ~~LOC~~ SOLN
30.0000 mg | Freq: Two times a day (BID) | SUBCUTANEOUS | Status: DC
  Administered 2020-03-13 – 2020-03-14 (×2): 30 mg via SUBCUTANEOUS
  Filled 2020-03-13 (×2): qty 0.3

## 2020-03-13 MED ORDER — ALBUMIN HUMAN 5 % IV SOLN
12.5000 g | Freq: Once | INTRAVENOUS | Status: AC
  Administered 2020-03-13: 12.5 g via INTRAVENOUS
  Filled 2020-03-13: qty 250

## 2020-03-13 MED ORDER — PIVOT 1.5 CAL PO LIQD
1000.0000 mL | ORAL | Status: DC
  Administered 2020-03-13 – 2020-03-24 (×14): 1000 mL
  Filled 2020-03-13 (×12): qty 1000

## 2020-03-13 MED ORDER — ASCORBIC ACID 500 MG PO TABS
1000.0000 mg | ORAL_TABLET | Freq: Three times a day (TID) | ORAL | Status: AC
  Administered 2020-03-13 – 2020-03-20 (×19): 1000 mg
  Filled 2020-03-13 (×18): qty 2

## 2020-03-13 MED ORDER — PANTOPRAZOLE SODIUM 40 MG IV SOLR
40.0000 mg | Freq: Every day | INTRAVENOUS | Status: DC
  Administered 2020-03-13 – 2020-03-14 (×2): 40 mg via INTRAVENOUS
  Filled 2020-03-13 (×2): qty 40
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
## 2020-03-13 NOTE — Progress Notes (Signed)
Neurosurgery  Patient seen and examined. Right eye swollen shot with chemosis making assessment difficult. Left eye open to stimulus, pupil round and reactive. Purposeful and left upper extremities, withdraws in bilateral lower extremities.  Repeat CT head today to determine if safe to start lovenox for dvt prophylaxis.  Purposefully localizing but if neurologic exam doesn't improve to the point of following commands over ne xt few days, MRI brain would be useful to assess for shear/diffuse axonal injury for prognostic purposes.

## 2020-03-13 NOTE — TOC CAGE-AID Note (Signed)
Transition of Care [facility]) - CAGE-AID Screening   Patient Details  Name: Anthony Macias MRN: [id number] Date of Birth: 11-14-1980  [facility]) CM/SW Contact:    Jimmy Picket, LCSWA Phone Number: 03/13/2020, 11:13 AM   Clinical Narrative:  Pt was unable to participate in assessment due to being on vent. Pts blood alcohol level was .336. please re consult when medically appropriate.   CAGE-AID Screening: Substance Abuse Screening unable to be completed due to: : Patient unable to participate            Isabella Stalling Clinical Social Worker [phone]
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
## 2020-03-13 NOTE — Progress Notes (Signed)
Need MRI scan right shoulder prior to fracture fixation next week.
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
## 2020-03-14 ENCOUNTER — Inpatient Hospital Stay (HOSPITAL_COMMUNITY): Payer: 59

## 2020-03-14 LAB — BASIC METABOLIC PANEL WITH GFR
Anion gap: 7 (ref 5–15)
BUN: 5 mg/dL — ABNORMAL LOW (ref 6–20)
CO2: 22 mmol/L (ref 22–32)
Calcium: 7.1 mg/dL — ABNORMAL LOW (ref 8.9–10.3)
Chloride: 118 mmol/L — ABNORMAL HIGH (ref 98–111)
Creatinine, Ser: 0.77 mg/dL (ref 0.61–1.24)
GFR calc Af Amer: >60 mL/min (ref >60)
GFR calc non Af Amer: >60 mL/min (ref >60)
Glucose, Bld: 138 mg/dL — ABNORMAL HIGH (ref 70–99)
Potassium: 3 mmol/L — ABNORMAL LOW (ref 3.5–5.1)
Sodium: 147 mmol/L — ABNORMAL HIGH (ref 135–145)

## 2020-03-14 LAB — HEMOGLOBIN AND HEMATOCRIT, BLOOD
HCT: 23.3 % — ABNORMAL LOW (ref 39.0–52.0)
Hemoglobin: 7.8 g/dL — ABNORMAL LOW (ref 13.0–17.0)

## 2020-03-14 LAB — CBC
HCT: 19.5 % — ABNORMAL LOW (ref 39.0–52.0)
Hemoglobin: 6.8 g/dL — CL (ref 13.0–17.0)
MCH: 31.8 pg (ref 26.0–34.0)
MCHC: 34.9 g/dL (ref 30.0–36.0)
MCV: 91.1 fL (ref 80.0–100.0)
Platelets: 135 K/uL — ABNORMAL LOW (ref 150–400)
RBC: 2.14 MIL/uL — ABNORMAL LOW (ref 4.22–5.81)
RDW: 15.1 % (ref 11.5–15.5)
WBC: 6 K/uL (ref 4.0–10.5)
nRBC: 0 % (ref 0.0–0.2)

## 2020-03-14 LAB — GLUCOSE, CAPILLARY
Glucose-Capillary: 68 mg/dL — ABNORMAL LOW (ref 70–99)
Glucose-Capillary: 174 mg/dL — ABNORMAL HIGH (ref 70–99)
Glucose-Capillary: 144 mg/dL — ABNORMAL HIGH (ref 70–99)
Glucose-Capillary: 146 mg/dL — ABNORMAL HIGH (ref 70–99)
Glucose-Capillary: 128 mg/dL — ABNORMAL HIGH (ref 70–99)
Glucose-Capillary: 130 mg/dL — ABNORMAL HIGH (ref 70–99)

## 2020-03-14 LAB — PREPARE RBC (CROSSMATCH)

## 2020-03-14 LAB — MAGNESIUM
Magnesium: 2.1 mg/dL (ref 1.7–2.4)
Magnesium: 2.2 mg/dL (ref 1.7–2.4)

## 2020-03-14 LAB — PHOSPHORUS
Phosphorus: 1.8 mg/dL — ABNORMAL LOW (ref 2.5–4.6)
Phosphorus: 1.8 mg/dL — ABNORMAL LOW (ref 2.5–4.6)

## 2020-03-14 LAB — VITAMIN D 25 HYDROXY (VIT D DEFICIENCY, FRACTURES): Vit D, 25-Hydroxy: 11.67 ng/mL — ABNORMAL LOW (ref 30–100)

## 2020-03-14 MED ORDER — ENOXAPARIN SODIUM 30 MG/0.3ML ~~LOC~~ SOLN
30.0000 mg | Freq: Two times a day (BID) | SUBCUTANEOUS | Status: AC
Start: 2020-03-14 — End: 2020-03-14
  Administered 2020-03-14: 30 mg via SUBCUTANEOUS
  Filled 2020-03-14: qty 0.3

## 2020-03-14 MED ORDER — DEXTROSE 50 % IV SOLN
INTRAVENOUS | Status: AC
  Administered 2020-03-14: 25 mL
  Filled 2020-03-14: qty 50

## 2020-03-14 MED ORDER — POTASSIUM CHLORIDE IN NACL 40-0.9 MEQ/L-% IV SOLN
INTRAVENOUS | Status: DC
  Filled 2020-03-14 (×18): qty 1000

## 2020-03-14 MED ORDER — SODIUM CHLORIDE 0.9% IV SOLUTION: Freq: Once | INTRAVENOUS | Status: DC

## 2020-03-14 MED ORDER — SODIUM CHLORIDE 0.9% IV SOLUTION: Freq: Once | INTRAVENOUS | Status: AC

## 2020-03-14 NOTE — Progress Notes (Signed)
Pt intubated and sedated, hemoglobin is 6.8 this morning and will need to receive 1 unit of PRBC. RN attempted to reach is brother, Malahki Gasaway at [phone] to provide consent as this is the only family member documented as a contact. Consent for blood administration is unable to be obtained at this time and will be released under physician orders.

## 2020-03-14 NOTE — Progress Notes (Signed)
SBT CPAP/PS trialed this morning. Pt apneic despite stimulation. Backup RR kicked in. Pt returned to full support. RT will continue to monitor.

## 2020-03-14 NOTE — Progress Notes (Signed)
Mri shoulder pending - needs to be done this weekend for preop planning purposes  No lovenox Sunday for or planned monday

## 2020-03-14 NOTE — Progress Notes (Signed)
Pt hemodynamically unstable and is currently on 5 gtts, unable to transfer to MR of his shoulder at this time. RN attempted to contact Orthopedic MD to notify, no answer at this time.

## 2020-03-14 NOTE — Progress Notes (Signed)
Patient's eldest brother, Maryla Morrow, called and was briefly updated by RN. He can be reached by phone at [phone] anytime before 2 pm for an update from MD.

## 2020-03-14 NOTE — Progress Notes (Signed)
Patient ID: Anthony Macias, male   DOB: 23-Nov-1980, 39 y.o.   MRN: [id number]  Follow up - Trauma and Critical Care  Patient Details:    Anthony Macias is an 39 y.o. male.  Lines/tubes : Airway 7.5 mm (Active)  Secured at (cm) 24 cm 03/14/20 0810  Measured From Lips 03/14/20 0810  Secured Location Left 03/14/20 0810  Secured By [employer] 03/14/20 0810  Tube Holder Repositioned Yes 03/14/20 0810  Cuff Pressure (cm H2O) 30 cm H2O 03/13/20 2048  Site Condition Dry 03/14/20 0810     CVC Triple Lumen 03/12/20 Left Subclavian 15 cm (Active)  Indication for Insertion or Continuance of Line Vasoactive infusions 03/14/20 0800  Site Assessment Clean;Dry;Intact 03/14/20 0800  Proximal Lumen Status Infusing 03/14/20 0800  Medial Lumen Status Infusing 03/14/20 0800  Distal Lumen Status Infusing 03/14/20 0800  Dressing Type Transparent;Occlusive 03/14/20 0800  Dressing Status Clean;Dry;Intact;Antimicrobial disc in place 03/14/20 0800  Line Care Connections checked and tightened 03/14/20 0800  Dressing Intervention Dressing reinforced 03/12/20 2100  Dressing Change Due 03/19/20 03/14/20 0800     Arterial Line 03/12/20 Left Radial (Active)  Site Assessment Clean;Dry;Intact 03/14/20 0800  Line Status Pulsatile blood flow 03/14/20 0800  Art Line Waveform Appropriate 03/14/20 0800  Art Line Interventions Zeroed and calibrated;Leveled;Connections checked and tightened 03/14/20 0800  Color/Movement/Sensation Capillary refill less than 3 sec 03/14/20 0800  Dressing Type Transparent;Occlusive 03/14/20 0800  Dressing Status Clean;Dry;Intact;Antimicrobial disc in place 03/14/20 0800  Dressing Change Due 03/19/20 03/14/20 0800     NG/OG Tube Orogastric Center mouth Xray (Active)  External Length of Tube (cm) - (if applicable) 51 cm 03/14/20 0800  Site Assessment Clean;Dry;Intact 03/14/20 0800  Ongoing Placement Verification No change in respiratory status;No acute changes, not  attributed to clinical condition;No change in cm markings or external length of tube from initial placement 03/14/20 0800  Status Infusing tube feed 03/14/20 0800  Amount of suction 80 mmHg 03/13/20 0800  Drainage Appearance Bile 03/13/20 0800  Output (mL) 0 mL 03/13/20 0900     Urethral Catheter KTJ, RN Straight-tip 16 Fr. (Active)  Indication for Insertion or Continuance of Catheter No Indication:  Remove Catheter 03/14/20 0800  Site Assessment Clean;Intact;Dry 03/14/20 0800  Catheter Maintenance Catheter secured;Insertion date on drainage bag;Bag below level of bladder;Drainage bag/tubing not touching floor;No dependent loops;Seal intact;Bag emptied prior to transport 03/14/20 0800  Collection Container Standard drainage bag 03/14/20 0800  Securement Method Securing device (Describe) 03/14/20 0800  Urinary Catheter Interventions (if applicable) Unclamped 03/14/20 0800  Output (mL) 100 mL 03/14/20 1000    Microbiology/Sepsis markers: Results for orders placed or performed during the hospital encounter of 03/11/20  SARS Coronavirus 2 by RT PCR (hospital order, performed in [facility] hospital lab) Nasopharyngeal Nasopharyngeal Swab     Status: Abnormal   Collection Time: 03/11/20 11:45 PM   Specimen: Nasopharyngeal Swab  Result Value Ref Range Status   SARS Coronavirus 2 POSITIVE (A) NEGATIVE Final    Comment: RESULT CALLED TO, READ BACK BY AND VERIFIED WITH: N FARMER RN 03/12/20 0135 JDW (NOTE) SARS-CoV-2 target nucleic acids are DETECTED  SARS-CoV-2 RNA is generally detectable in upper respiratory specimens  during the acute phase of infection.  Positive results are indicative  of the presence of the identified virus, but do not rule out bacterial infection or co-infection with other pathogens not detected by the test.  Clinical correlation with patient history and  other diagnostic information is necessary to determine patient infection status.  The expected result is  negative.  Fact Sheet for Patients:   BoilerBrush.com.cy   Fact Sheet for Healthcare Providers:   https://pope.com/    This test is not yet approved or cleared by the [place] FDA and  has been authorized for detection and/or diagnosis of SARS-CoV-2 by FDA under an Emergency Use Authorization (EUA).  This EUA will remain in effect (meaning this test can  be used) for the duration of  the COVID-19 declaration under Section 564(b)(1) of the Act, 21 U.S.C. section 360-bbb-3(b)(1), unless the authorization is terminated or revoked sooner.  Performed at [facility], [address], [place], [place] [address]   MRSA PCR Screening     Status: None   Collection Time: 03/12/20 12:51 AM   Specimen: Urine, Catheterized; Nasopharyngeal  Result Value Ref Range Status   MRSA by PCR NEGATIVE NEGATIVE Final    Comment:        The GeneXpert MRSA Assay (FDA approved for NASAL specimens only), is one component of a comprehensive MRSA colonization surveillance program. It is not intended to diagnose MRSA infection nor to guide or monitor treatment for MRSA infections. Performed at [facility], [address], [place], [place] [address]     Anti-infectives:  Anti-infectives (From admission, onward)   Start     Dose/Rate Route Frequency Ordered Stop   03/12/20 1215  ceFAZolin (ANCEF) IVPB 2g/100 mL premix        2 g 200 mL/hr over 30 Minutes Intravenous On call to O.R. 03/12/20 1026 03/12/20 1833   03/11/20 2345  Ampicillin-Sulbactam (UNASYN) 3 g in sodium chloride 0.9 % 100 mL IVPB     Discontinue     3 g 200 mL/hr over 30 Minutes Intravenous Every 6 hours 03/11/20 2344        Consults: Treatment Team:  Myrene Galas, MD Bedelia Person, MD   Chief Complaint/Subjective:    Overnight Issues: No new events   Objective:  Vital signs for last 24 hours: Temp:  [97.6 F (36.4 C)-101.7 F (38.7 C)] 98.7 F  (37.1 C) (07/17 0950) Pulse Rate:  [59-127] 85 (07/17 1100) Resp:  [13-19] 16 (07/17 1100) BP: (89-184)/(50-120) 110/67 (07/17 1100) SpO2:  [92 %-100 %] 100 % (07/17 1100) Arterial Line BP: (91-174)/(43-94) 131/59 (07/17 1100) FiO2 (%):  [40 %] 40 % (07/17 0810) Weight:  [81.1 kg] 81.1 kg (07/17 0500)  Hemodynamic parameters for last 24 hours:    Intake/Output from previous day: 07/16 0701 - 07/17 0700 In: 6650.4 [I.V.:3988.7; NG/GT:952.4; IV Piggyback:1709.4] Out: 1615 [Urine:1615]  Intake/Output this shift: Total I/O In: 809.3 [I.V.:544.3; NG/GT:165; IV Piggyback:100] Out: 275 [Urine:275]  Vent settings for last 24 hours: Vent Mode: PRVC FiO2 (%):  [40 %] 40 % Set Rate:  [16 bmp-18 bmp] 16 bmp Vt Set:  [620 mL] 620 mL PEEP:  [5 cmH20] 5 cmH20 Plateau Pressure:  [16 cmH20-20 cmH20] 17 cmH20  Physical Exam:  General: on vent Neuro: sedated but wd to pain; moves all extremities HEENT/Neck: facial abrasions and edema Resp: clear to auscultation bilaterally CVS: regular rate and rhythm, S1, S2 normal, no murmur, click, rub or gallop GI: soft, nontender, BS WNL, no r/g Extremities: ortho dressings and splints  Results for orders placed or performed during the hospital encounter of 03/11/20 (from the past 24 hour(s))  Glucose, capillary     Status: Abnormal   Collection Time: 03/13/20 11:52 AM  Result Value Ref Range   Glucose-Capillary 148 (H) 70 -  99 mg/dL  Magnesium     Status: None   Collection Time: 03/13/20  2:25 PM  Result Value Ref Range   Magnesium 2.0 1.7 - 2.4 mg/dL  Phosphorus     Status: Abnormal   Collection Time: 03/13/20  2:25 PM  Result Value Ref Range   Phosphorus <1.0 (LL) 2.5 - 4.6 mg/dL  Glucose, capillary     Status: Abnormal   Collection Time: 03/13/20  3:38 PM  Result Value Ref Range   Glucose-Capillary 163 (H) 70 - 99 mg/dL  Magnesium     Status: None   Collection Time: 03/13/20  3:39 PM  Result Value Ref Range   Magnesium 2.0 1.7 - 2.4  mg/dL  Phosphorus     Status: Abnormal   Collection Time: 03/13/20  3:39 PM  Result Value Ref Range   Phosphorus <1.0 (LL) 2.5 - 4.6 mg/dL  CBC     Status: Abnormal   Collection Time: 03/13/20  3:39 PM  Result Value Ref Range   WBC 7.2 4.0 - 10.5 K/uL   RBC 2.39 (L) 4.22 - 5.81 MIL/uL   Hemoglobin 7.3 (L) 13.0 - 17.0 g/dL   HCT 32.9 (L) 39 - 52 %   MCV 89.5 80.0 - 100.0 fL   MCH 30.5 26.0 - 34.0 pg   MCHC 34.1 30.0 - 36.0 g/dL   RDW 51.8 84.1 - 66.0 %   Platelets 120 (L) 150 - 400 K/uL   nRBC 0.0 0.0 - 0.2 %  Glucose, capillary     Status: Abnormal   Collection Time: 03/13/20  8:32 PM  Result Value Ref Range   Glucose-Capillary 120 (H) 70 - 99 mg/dL  Glucose, capillary     Status: Abnormal   Collection Time: 03/13/20 11:32 PM  Result Value Ref Range   Glucose-Capillary 173 (H) 70 - 99 mg/dL  CBC     Status: Abnormal   Collection Time: 03/14/20  3:02 AM  Result Value Ref Range   WBC 6.0 4.0 - 10.5 K/uL   RBC 2.14 (L) 4.22 - 5.81 MIL/uL   Hemoglobin 6.8 (LL) 13.0 - 17.0 g/dL   HCT 63.0 (L) 39 - 52 %   MCV 91.1 80.0 - 100.0 fL   MCH 31.8 26.0 - 34.0 pg   MCHC 34.9 30.0 - 36.0 g/dL   RDW 16.0 10.9 - 32.3 %   Platelets 135 (L) 150 - 400 K/uL   nRBC 0.0 0.0 - 0.2 %  Basic metabolic panel     Status: Abnormal   Collection Time: 03/14/20  3:02 AM  Result Value Ref Range   Sodium 147 (H) 135 - 145 mmol/L   Potassium 3.0 (L) 3.5 - 5.1 mmol/L   Chloride 118 (H) 98 - 111 mmol/L   CO2 22 22 - 32 mmol/L   Glucose, Bld 138 (H) 70 - 99 mg/dL   BUN 5 (L) 6 - 20 mg/dL   Creatinine, Ser 5.57 0.61 - 1.24 mg/dL   Calcium 7.1 (L) 8.9 - 10.3 mg/dL   GFR calc non Af Amer >60 >60 mL/min   GFR calc Af Amer >60 >60 mL/min   Anion gap 7 5 - 15  Magnesium     Status: None   Collection Time: 03/14/20  3:02 AM  Result Value Ref Range   Magnesium 2.1 1.7 - 2.4 mg/dL  Phosphorus     Status: Abnormal   Collection Time: 03/14/20  3:02 AM  Result Value Ref Range   Phosphorus 1.8 (L)  2.5 - 4.6  mg/dL  VITAMIN D 25 Hydroxy (Vit-D Deficiency, Fractures)     Status: Abnormal   Collection Time: 03/14/20  3:02 AM  Result Value Ref Range   Vit D, 25-Hydroxy 11.67 (L) 30 - 100 ng/mL  Glucose, capillary     Status: Abnormal   Collection Time: 03/14/20  4:10 AM  Result Value Ref Range   Glucose-Capillary 68 (L) 70 - 99 mg/dL  Glucose, capillary     Status: Abnormal   Collection Time: 03/14/20  5:03 AM  Result Value Ref Range   Glucose-Capillary 174 (H) 70 - 99 mg/dL  Prepare RBC (crossmatch)     Status: None   Collection Time: 03/14/20  7:08 AM  Result Value Ref Range   Order Confirmation      ORDER PROCESSED BY BLOOD BANK Performed at [facility], [address] [place], [place] [address]   Glucose, capillary     Status: Abnormal   Collection Time: 03/14/20  7:56 AM  Result Value Ref Range   Glucose-Capillary 144 (H) 70 - 99 mg/dL     Assessment/Plan:  PHBC  TBI/F ICC/SDH/SAH/pneumocephalus - per Dr. Maisie Fus, Unasyn, Keppra, F/U CT H 7/15 expected enlargement ICCs, F/U CT H in AM R orbit FX/R maxillary sinus FX/skull base FXs - per Dr. Uvaldo Rising R humeral head FX dislocation/R clavicle FX/R coracoid FX - relocated in ED by Dr. August Saucer, reconstruction planned by Dr. August Saucer L distal femur FX - S/P ORIF by Dr. Carola Frost 7/15 L bimalleolar ankle FX - S/P ORIF by Dr. Carola Frost 7/15 Pelvic ring FX - S/P B SI pinning by Dr. Carola Frost 7/15 L glenoid and scapula FXs - per Dr. Carola Frost R radial shaft FX - S/P ORIF by Dr. Carola Frost 7/15 L rib FX 2-4 ABL anemia - transfuse 1 u PRBC today Acute hypoxic ventilator dependent respiratory failure - begin weaning COVID + - not notably symptomatic CV - off pressors as he is resuscitated FEN - start TF VTE - PAS as able, OK for Lovenox per Dr. Jarvis Newcomer - ICU   LOS: 3 days   Additional comments:I reviewed the patient's new clinical lab test results. CBC,BMP and I reviewed the patients new imaging test results. CXR  Critical Care Total Time*: 15  Minutes  Wynona Luna 03/14/2020  *Care during the described time interval was provided by me and/or other providers on the critical care team.  I have reviewed this patient's available data, including medical history, events of note, physical examination and test results as part of my evaluation.

## 2020-03-14 NOTE — Progress Notes (Signed)
Patient ID: Anthony Macias, male   DOB: 09-28-1980, 39 y.o.   MRN: [id number] Patient intubated and sedated reportedly no significant change moves all extremities when light with consideration given orthopedic injuries.  Follow-up CT stable Lovenox initiated last night continue supportive care per trauma

## 2020-03-15 ENCOUNTER — Inpatient Hospital Stay (HOSPITAL_COMMUNITY): Payer: 59

## 2020-03-15 LAB — TYPE AND SCREEN
ABO/RH(D): O POS
ABO/RH(D): O POS
Antibody Screen: NEGATIVE
Antibody Screen: NEGATIVE
Unit division: 0
Unit division: 0
Unit division: 0

## 2020-03-15 LAB — CBC
HCT: 23 % — ABNORMAL LOW (ref 39.0–52.0)
Hemoglobin: 7.8 g/dL — ABNORMAL LOW (ref 13.0–17.0)
MCH: 30.4 pg (ref 26.0–34.0)
MCHC: 33.9 g/dL (ref 30.0–36.0)
MCV: 89.5 fL (ref 80.0–100.0)
Platelets: 162 K/uL (ref 150–400)
RBC: 2.57 MIL/uL — ABNORMAL LOW (ref 4.22–5.81)
RDW: 15.2 % (ref 11.5–15.5)
WBC: 8 K/uL (ref 4.0–10.5)
nRBC: 0 % (ref 0.0–0.2)

## 2020-03-15 LAB — BASIC METABOLIC PANEL WITH GFR
Anion gap: 7 (ref 5–15)
BUN: 7 mg/dL (ref 6–20)
CO2: 23 mmol/L (ref 22–32)
Calcium: 7.8 mg/dL — ABNORMAL LOW (ref 8.9–10.3)
Chloride: 117 mmol/L — ABNORMAL HIGH (ref 98–111)
Creatinine, Ser: 0.77 mg/dL (ref 0.61–1.24)
GFR calc Af Amer: >60 mL/min (ref >60)
GFR calc non Af Amer: >60 mL/min (ref >60)
Glucose, Bld: 144 mg/dL — ABNORMAL HIGH (ref 70–99)
Potassium: 3.4 mmol/L — ABNORMAL LOW (ref 3.5–5.1)
Sodium: 147 mmol/L — ABNORMAL HIGH (ref 135–145)

## 2020-03-15 LAB — BPAM RBC
Blood Product Expiration Date: 202108162359
Blood Product Expiration Date: 202108162359
Blood Product Expiration Date: 202108162359
ISSUE DATE / TIME: 202107151633
ISSUE DATE / TIME: 202107151633
ISSUE DATE / TIME: 202107170900
Unit Type and Rh: 5100
Unit Type and Rh: 5100
Unit Type and Rh: 5100

## 2020-03-15 LAB — GLUCOSE, CAPILLARY
Glucose-Capillary: 125 mg/dL — ABNORMAL HIGH (ref 70–99)
Glucose-Capillary: 127 mg/dL — ABNORMAL HIGH (ref 70–99)
Glucose-Capillary: 132 mg/dL — ABNORMAL HIGH (ref 70–99)
Glucose-Capillary: 133 mg/dL — ABNORMAL HIGH (ref 70–99)
Glucose-Capillary: 98 mg/dL (ref 70–99)

## 2020-03-15 LAB — TRIGLYCERIDES: Triglycerides: 191 mg/dL — ABNORMAL HIGH (ref <150)

## 2020-03-15 MED ORDER — CHLORHEXIDINE GLUCONATE 4 % EX LIQD
60.0000 mL | Freq: Once | CUTANEOUS | Status: AC
  Administered 2020-03-16: 4 via TOPICAL
  Filled 2020-03-15: qty 60

## 2020-03-15 MED ORDER — LIDOCAINE HCL (PF) 1 % IJ SOLN
INTRAMUSCULAR | Status: AC
  Administered 2020-03-15: 5 mL via INTRADERMAL
  Filled 2020-03-15: qty 30

## 2020-03-15 MED ORDER — LIDOCAINE HCL (PF) 1 % IJ SOLN: 5.0000 mL | Freq: Once | INTRAMUSCULAR | Status: AC

## 2020-03-15 MED ORDER — FREE WATER
200.0000 mL | Freq: Three times a day (TID) | Status: DC
  Administered 2020-03-15 – 2020-03-23 (×23): 200 mL

## 2020-03-15 MED ORDER — POVIDONE-IODINE 10 % EX SWAB
2.0000 "application " | Freq: Once | CUTANEOUS | Status: AC
  Administered 2020-03-16: 2 via TOPICAL

## 2020-03-15 MED ORDER — BACITRACIN ZINC 500 UNIT/GM EX OINT
1.0000 "application " | TOPICAL_OINTMENT | Freq: Two times a day (BID) | CUTANEOUS | Status: AC
  Administered 2020-03-15 – 2020-03-25 (×19): 1 via TOPICAL
  Filled 2020-03-15 (×2): qty 28.4

## 2020-03-15 MED ORDER — MUPIROCIN 2 % EX OINT
TOPICAL_OINTMENT | CUTANEOUS | Status: AC
  Administered 2020-03-15: 1
  Filled 2020-03-15: qty 22

## 2020-03-15 MED ORDER — CEFAZOLIN SODIUM-DEXTROSE 2-4 GM/100ML-% IV SOLN: 2.0000 g | INTRAVENOUS | Status: DC

## 2020-03-15 MED ORDER — POTASSIUM PHOSPHATES 15 MMOLE/5ML IV SOLN
30.0000 mmol | Freq: Once | INTRAVENOUS | Status: AC
  Administered 2020-03-15: 30 mmol via INTRAVENOUS
  Filled 2020-03-15: qty 10

## 2020-03-15 MED ORDER — PANTOPRAZOLE SODIUM 40 MG PO PACK
40.0000 mg | PACK | Freq: Every day | ORAL | Status: DC
  Administered 2020-03-15 – 2020-03-26 (×12): 40 mg
  Filled 2020-03-15 (×12): qty 20

## 2020-03-15 MED ORDER — LIDOCAINE HCL (PF) 1 % IJ SOLN
INTRAMUSCULAR | Status: AC
  Filled 2020-03-15: qty 5

## 2020-03-15 NOTE — Progress Notes (Addendum)
Patient seen  still intubated Plan surgery tomorrow for shoulder fracture fixation. Emergency consent performed Discussed the procedure with Earvin his brother.

## 2020-03-15 NOTE — Procedures (Signed)
  Anesthesia: Lidocaine 1%, 5cc's Type of Repair: complex repair Reason for Type of Repair: reduce the risk of dehiscence, infection, and skin necrosis The area was prepared and draped in a standard fashion. The wound was cleansed with normal saline and betadine. Meticulous hemostasis was obtained with pressure. Repair: Complex Repair Various closure modalities were discussed with the patient, and complex repair was chosen. This type of repair was necessary due to the reason above. The area was extensively undermined so that the edges could be brought together. Tissue redundancies were removed at both poles in a manner to conform to rhytides and avoid crossing cosmetic units as much as possible. Subcutaneous layers were closed with 4-0 Vicryl using buried horizontal mattress stitches. Fine approximation was achieved with 5-0 plain gut suture using simple running stitches. Length of repair was 4 cm. Ointment and dressing were applied. The specimen was sent for pathologic examination.  The patient tolerated the procedure well.

## 2020-03-15 NOTE — Consult Note (Addendum)
Subjective:    Anthony Macias is a 39 y.o. male who I am asked to see in consultation for facial fractures and laceration of the right brow. The patient reportedly has had purposeful movements of all extremities and is continuing to recover from his neurological trauma.    Objective:    BP 120/71   Pulse 72   Temp 99.5 F (37.5 C) (Axillary)   Resp 16   Ht (S) 6' (1.829 m) Comment: ;measured height  Wt 81.1 kg   SpO2 100%   BMI 24.25 kg/m    Neurological: Patient withdraws to pain during exam HEENT:  No bony step offs palpable along the orbital rim or inferior border of the mandible. Laceration with mild oozing of there right lateral orbital rim. Laceration surrounded peripherally and over the right upper lid with abrasion. Lid in tact aside from abrasion. Laceration is stellate in nature and measures 4cm in total.  Patient's mid face is stable along the anterior maxilla and nasal bones. There is heme noted crusted on bilateral nares without obvious CSF noted. Intraorally his occlusion is unable to be assessed 2/2 ET tube, however there is no step offs noted in his occlusion.  CV: RRR Pulm: Intubated with mechanical breath sounds Abd: Non-distended  Assessment:  Patient has multiple fractures including right orbital floor (minimally displaced), right nasal bone fracture (non-dislaced), zygomatic arch (minimally displaced), maxillary sinus wall (minimally displaced) and right coronoid process fracture (non-displaced), right sphenoid wing with associated frontal lobe contusion with associated pneumocephalus and presumed CSF leak , left sphenoid wing (non-displaced), left pterygoid plate (non-displaced) and right coronoid process (non-displaced). The right brow laceration is hemostatic and will be was cleaned and sutured today bedside.     Plan:   -  Facial fractures are non-op. Patient will need to maintain a soft non-chew diet for 6 weeks from date of injury - Right brow laceration  closed today. See procedure note for details.  - Recommend continuing Unasyn for 7 days for facial fracture and likely CSF leak. - Please apply bacitracin to right brow laceration and abrasions for 10 days (order placed) - Agree with Neurosurgery that CSF leak will likely close without intervention. Continue to monitor for any drainage nasally consistent with CSF leak. None has been seen to date.   - The patient will need to follow up in 2-3 weeks with our office. See contact information below.   Follow up: Patient should follow up in 1-2 weeks at our office below. Please provide contact information to patient  Dr. Valda Favia  [facility]  [phone]  [address], [place] [address]   Other injuries: TBI-pneumocephalus, small mount of subarachnoid blood and frontal contusion.  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   I will sign off at this time. Please do not hesitate to contact me with questions or concerns.

## 2020-03-15 NOTE — Progress Notes (Signed)
MRI scan reviewed. Shoulder is located. Greater tuberosity fracture fragment holds the supraspinatus and infraspinatus Nondisplaced anterior inferior glenoid rim fracture should heal without surgery Coracoid fracture is displaced and may require fixation at the time of plate fixation of the greater tuberosity fragment. Plan for surgery tomorrow late afternoon.

## 2020-03-15 NOTE — Progress Notes (Signed)
Pt hemodynamically stable, MRI planned today at 10 am
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
## 2020-03-16 ENCOUNTER — Inpatient Hospital Stay (HOSPITAL_COMMUNITY): Payer: 59 | Admitting: Anesthesiology

## 2020-03-16 ENCOUNTER — Encounter (HOSPITAL_COMMUNITY): Payer: Self-pay

## 2020-03-16 ENCOUNTER — Inpatient Hospital Stay (HOSPITAL_COMMUNITY): Payer: 59

## 2020-03-16 ENCOUNTER — Encounter (HOSPITAL_COMMUNITY): Admission: EM | Disposition: A | Discharge status: Rehabilitation Facility | Payer: Self-pay | Source: Home / Self Care

## 2020-03-16 DIAGNOSIS — S42131A Displaced fracture of coracoid process, right shoulder, initial encounter for closed fracture: Secondary | ICD-10-CM

## 2020-03-16 DIAGNOSIS — S42251A Displaced fracture of greater tuberosity of right humerus, initial encounter for closed fracture: Secondary | ICD-10-CM

## 2020-03-16 HISTORY — PX: ORIF SHOULDER FRACTURE: SHX5035

## 2020-03-16 LAB — CBC
HCT: 23.9 % — ABNORMAL LOW (ref 39.0–52.0)
Hemoglobin: 8.2 g/dL — ABNORMAL LOW (ref 13.0–17.0)
MCH: 30.9 pg (ref 26.0–34.0)
MCHC: 34.3 g/dL (ref 30.0–36.0)
MCV: 90.2 fL (ref 80.0–100.0)
Platelets: 190 K/uL (ref 150–400)
RBC: 2.65 MIL/uL — ABNORMAL LOW (ref 4.22–5.81)
RDW: 14.7 % (ref 11.5–15.5)
WBC: 9.9 K/uL (ref 4.0–10.5)
nRBC: 0.3 % — ABNORMAL HIGH (ref 0.0–0.2)

## 2020-03-16 LAB — BASIC METABOLIC PANEL WITH GFR
Anion gap: 7 (ref 5–15)
BUN: 9 mg/dL (ref 6–20)
CO2: 23 mmol/L (ref 22–32)
Calcium: 8.1 mg/dL — ABNORMAL LOW (ref 8.9–10.3)
Chloride: 113 mmol/L — ABNORMAL HIGH (ref 98–111)
Creatinine, Ser: 0.7 mg/dL (ref 0.61–1.24)
GFR calc Af Amer: >60 mL/min (ref >60)
GFR calc non Af Amer: >60 mL/min (ref >60)
Glucose, Bld: 135 mg/dL — ABNORMAL HIGH (ref 70–99)
Potassium: 3.8 mmol/L (ref 3.5–5.1)
Sodium: 143 mmol/L (ref 135–145)

## 2020-03-16 LAB — GLUCOSE, CAPILLARY
Glucose-Capillary: 108 mg/dL — ABNORMAL HIGH (ref 70–99)
Glucose-Capillary: 113 mg/dL — ABNORMAL HIGH (ref 70–99)
Glucose-Capillary: 120 mg/dL — ABNORMAL HIGH (ref 70–99)
Glucose-Capillary: 138 mg/dL — ABNORMAL HIGH (ref 70–99)

## 2020-03-16 LAB — PHOSPHORUS: Phosphorus: 4.1 mg/dL (ref 2.5–4.6)

## 2020-03-16 SURGERY — OPEN REDUCTION INTERNAL FIXATION (ORIF) SHOULDER FRACTURE
Anesthesia: General | Site: Shoulder | Laterality: Right

## 2020-03-16 MED ORDER — MIDAZOLAM HCL 2 MG/2ML IJ SOLN
INTRAMUSCULAR | Status: AC
  Filled 2020-03-16: qty 2

## 2020-03-16 MED ORDER — VANCOMYCIN HCL 1000 MG IV SOLR
INTRAVENOUS | Status: AC
  Filled 2020-03-16: qty 1000

## 2020-03-16 MED ORDER — LACTATED RINGERS IV SOLN: INTRAVENOUS | Status: DC | PRN

## 2020-03-16 MED ORDER — FENTANYL CITRATE (PF) 100 MCG/2ML IJ SOLN
INTRAMUSCULAR | Status: DC | PRN
  Administered 2020-03-16: 150 ug via INTRAVENOUS
  Administered 2020-03-16: 100 ug via INTRAVENOUS

## 2020-03-16 MED ORDER — FENTANYL CITRATE (PF) 250 MCG/5ML IJ SOLN
INTRAMUSCULAR | Status: AC
  Filled 2020-03-16: qty 5

## 2020-03-16 MED ORDER — ALBUMIN HUMAN 5 % IV SOLN: INTRAVENOUS | Status: DC | PRN

## 2020-03-16 MED ORDER — 0.9 % SODIUM CHLORIDE (POUR BTL) OPTIME
TOPICAL | Status: DC | PRN
  Administered 2020-03-16: 1000 mL

## 2020-03-16 MED ORDER — HYDRALAZINE HCL 20 MG/ML IJ SOLN
INTRAMUSCULAR | Status: AC
  Filled 2020-03-16: qty 1

## 2020-03-16 MED ORDER — ROCURONIUM BROMIDE 10 MG/ML (PF) SYRINGE
PREFILLED_SYRINGE | INTRAVENOUS | Status: DC | PRN
  Administered 2020-03-16: 100 mg via INTRAVENOUS

## 2020-03-16 MED ORDER — VANCOMYCIN HCL 1000 MG IV SOLR
INTRAVENOUS | Status: DC | PRN
  Administered 2020-03-16: 1000 mg via TOPICAL

## 2020-03-16 SURGICAL SUPPLY — 75 items
ANCHOR SUT QUATTRO KNTLS 4.5 (Anchor) ×8 IMPLANT
ANCHOR SUT QUATTRO X1 5.5 (Anchor) ×4 IMPLANT
BENZOIN TINCTURE PRP APPL 2/3 (GAUZE/BANDAGES/DRESSINGS) ×1 IMPLANT
BIT DRILL 2.4 AO COUPLING CANN (BIT) ×2 IMPLANT
BNDG COHESIVE 4X5 TAN STRL (GAUZE/BANDAGES/DRESSINGS) ×3 IMPLANT
BNDG ELASTIC 4X5.8 VLCR STR LF (GAUZE/BANDAGES/DRESSINGS) ×2 IMPLANT
BNDG ELASTIC 6X10 VLCR STRL LF (GAUZE/BANDAGES/DRESSINGS) ×2 IMPLANT
BNDG ELASTIC 6X5.8 VLCR STR LF (GAUZE/BANDAGES/DRESSINGS) IMPLANT
BNDG GAUZE ELAST 4 BULKY (GAUZE/BANDAGES/DRESSINGS) ×2 IMPLANT
COVER SURGICAL LIGHT HANDLE (MISCELLANEOUS) ×3 IMPLANT
COVER WAND RF STERILE (DRAPES) ×3 IMPLANT
DRAIN PENROSE 1/2X12 LTX STRL (WOUND CARE) IMPLANT
DRAPE C-ARM 42X72 X-RAY (DRAPES) ×2 IMPLANT
DRAPE IMP U-DRAPE 54X76 (DRAPES) ×3 IMPLANT
DRAPE U-SHAPE 47X51 STRL (DRAPES) ×3 IMPLANT
DRESSING AQUACEL AG SP 3.5X6 (GAUZE/BANDAGES/DRESSINGS) IMPLANT
DRSG ADAPTIC 3X8 NADH LF (GAUZE/BANDAGES/DRESSINGS) ×2 IMPLANT
DRSG AQUACEL AG ADV 3.5X10 (GAUZE/BANDAGES/DRESSINGS) ×4 IMPLANT
DRSG AQUACEL AG SP 3.5X6 (GAUZE/BANDAGES/DRESSINGS) ×3
DRSG PAD ABDOMINAL 8X10 ST (GAUZE/BANDAGES/DRESSINGS) IMPLANT
ELECT REM PT RETURN 9FT ADLT (ELECTROSURGICAL) ×3
ELECTRODE REM PT RTRN 9FT ADLT (ELECTROSURGICAL) ×1 IMPLANT
FACESHIELD WRAPAROUND (MASK) ×3 IMPLANT
FACESHIELD WRAPAROUND OR TEAM (MASK) ×1 IMPLANT
GAUZE SPONGE 4X4 12PLY STRL (GAUZE/BANDAGES/DRESSINGS) ×2 IMPLANT
GAUZE XEROFORM 5X9 LF (GAUZE/BANDAGES/DRESSINGS) IMPLANT
GLOVE BIOGEL PI IND STRL 8 (GLOVE) ×1 IMPLANT
GLOVE BIOGEL PI INDICATOR 8 (GLOVE) ×2
GLOVE ECLIPSE 8.0 STRL XLNG CF (GLOVE) ×5 IMPLANT
GOWN STRL REUS W/ TWL LRG LVL3 (GOWN DISPOSABLE) ×2 IMPLANT
GOWN STRL REUS W/ TWL XL LVL3 (GOWN DISPOSABLE) ×1 IMPLANT
GOWN STRL REUS W/TWL LRG LVL3 (GOWN DISPOSABLE) ×4
GOWN STRL REUS W/TWL XL LVL3 (GOWN DISPOSABLE) ×2
K-WIRE ACE 1.6X6 (WIRE) ×6
KIT BASIN OR (CUSTOM PROCEDURE TRAY) ×3 IMPLANT
KIT TURNOVER KIT B (KITS) ×3 IMPLANT
KWIRE ACE 1.6X6 (WIRE) IMPLANT
MANIFOLD NEPTUNE II (INSTRUMENTS) ×3 IMPLANT
NDL SUT 6 .5 CRC .975X.05 MAYO (NEEDLE) IMPLANT
NEEDLE MAYO TAPER (NEEDLE) ×4
NS IRRIG 1000ML POUR BTL (IV SOLUTION) ×3 IMPLANT
PACK SHOULDER (CUSTOM PROCEDURE TRAY) ×3 IMPLANT
PACK UNIVERSAL I (CUSTOM PROCEDURE TRAY) ×3 IMPLANT
PAD ABD 8X10 STRL (GAUZE/BANDAGES/DRESSINGS) ×2 IMPLANT
PAD ARMBOARD 7.5X6 YLW CONV (MISCELLANEOUS) ×6 IMPLANT
PAD CAST 4YDX4 CTTN HI CHSV (CAST SUPPLIES) IMPLANT
PADDING CAST COTTON 4X4 STRL (CAST SUPPLIES) ×4
SCREW CANN 4.0X30 (Screw) ×2 IMPLANT
SCREW CANN PT 30X4XCANN NS SM (Screw) ×1 IMPLANT
SCREW CANN PT 4X26 NS (Screw) IMPLANT
SCREW CANN PT 4X30 NS (Screw) IMPLANT
SCREW PT 4.0X26MM (Screw) ×3 IMPLANT
SLING ARM IMMOBILIZER LRG (SOFTGOODS) ×2 IMPLANT
SPLINT FIBERGLASS 4X30 (CAST SUPPLIES) ×2 IMPLANT
SPONGE LAP 4X18 RFD (DISPOSABLE) ×6 IMPLANT
STAPLER VISISTAT 35W (STAPLE) IMPLANT
STOCKINETTE IMPERVIOUS 9X36 MD (GAUZE/BANDAGES/DRESSINGS) ×2 IMPLANT
SUCTION FRAZIER HANDLE 10FR (MISCELLANEOUS)
SUCTION TUBE FRAZIER 10FR DISP (MISCELLANEOUS) IMPLANT
SUT BROADBAND TAPE 2PK 1.5 (SUTURE) ×6 IMPLANT
SUT MAXBRAID (SUTURE) ×2 IMPLANT
SUT MNCRL AB 3-0 PS2 18 (SUTURE) ×2 IMPLANT
SUT VIC AB 0 CT1 27 (SUTURE) ×2
SUT VIC AB 0 CT1 27XBRD ANBCTR (SUTURE) IMPLANT
SUT VIC AB 1 CT1 36 (SUTURE) ×10 IMPLANT
SUT VIC AB 2-0 CT1 27 (SUTURE) ×8
SUT VIC AB 2-0 CT1 36 (SUTURE) ×4 IMPLANT
SUT VIC AB 2-0 CT1 TAPERPNT 27 (SUTURE) IMPLANT
SUT VICRYL 0 UR6 27IN ABS (SUTURE) ×6 IMPLANT
TOWEL GREEN STERILE (TOWEL DISPOSABLE) ×3 IMPLANT
TOWEL GREEN STERILE FF (TOWEL DISPOSABLE) ×3 IMPLANT
TUBE CONNECTING 12'X1/4 (SUCTIONS)
TUBE CONNECTING 12X1/4 (SUCTIONS) IMPLANT
WATER STERILE IRR 1000ML POUR (IV SOLUTION) ×3 IMPLANT
YANKAUER SUCT BULB TIP NO VENT (SUCTIONS) IMPLANT

## 2020-03-16 NOTE — Brief Op Note (Signed)
   03/16/2020  11:36 PM  PATIENT:  Anthony Macias  39 y.o. male  PRE-OPERATIVE DIAGNOSIS:  right shoulder fracture  POST-OPERATIVE DIAGNOSIS:  right shoulder fracture  PROCEDURE:  Procedure(s): OPEN REDUCTION INTERNAL FIXATION RIGHT PROXIMAL HUMERUS FRACTURE AND coracoid FRACTURE WITH BICEPS TENODESIS  SURGEON:  Surgeon(s): Cammy Copa, MD  ASSISTANT: magnant pa  ANESTHESIA:   general  EBL: 300 ml    Total I/O In: 1350 [I.V.:1000; IV Piggyback:350] Out: 1850 [Urine:1450; Blood:400]  BLOOD ADMINISTERED: none  DRAINS: none   LOCAL MEDICATIONS USED:  vanco  SPECIMEN:  No Specimen  COUNTS:  YES  TOURNIQUET:  * No tourniquets in log *  DICTATION: .Other Dictation: Dictation Number [id number]  PLAN OF CARE: Admit to inpatient   PATIENT DISPOSITION:  PACU - hemodynamically stable
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
## 2020-03-16 NOTE — Progress Notes (Signed)
Neurosurgery  Patient remains sedated with no change in neuro exam.  No evidence of leak from nose.  Continue supportive care.   Recommend isotonic fluids, maintaining normotension (MAPs 70-90) during surgery.
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
## 2020-03-16 NOTE — Progress Notes (Signed)
Transported patient from 4N23 to OR without any complications.  Suctioned patient before taking to OR.  Will be awaiting their call to transport back after procedure.

## 2020-03-17 ENCOUNTER — Inpatient Hospital Stay (HOSPITAL_COMMUNITY): Payer: 59

## 2020-03-17 LAB — CBC
HCT: 24.5 % — ABNORMAL LOW (ref 39.0–52.0)
Hemoglobin: 8.5 g/dL — ABNORMAL LOW (ref 13.0–17.0)
MCH: 31.4 pg (ref 26.0–34.0)
MCHC: 34.7 g/dL (ref 30.0–36.0)
MCV: 90.4 fL (ref 80.0–100.0)
Platelets: 234 K/uL (ref 150–400)
RBC: 2.71 MIL/uL — ABNORMAL LOW (ref 4.22–5.81)
RDW: 14.3 % (ref 11.5–15.5)
WBC: 11 K/uL — ABNORMAL HIGH (ref 4.0–10.5)
nRBC: 0 % (ref 0.0–0.2)

## 2020-03-17 LAB — GLUCOSE, CAPILLARY
Glucose-Capillary: 111 mg/dL — ABNORMAL HIGH (ref 70–99)
Glucose-Capillary: 119 mg/dL — ABNORMAL HIGH (ref 70–99)
Glucose-Capillary: 130 mg/dL — ABNORMAL HIGH (ref 70–99)
Glucose-Capillary: 136 mg/dL — ABNORMAL HIGH (ref 70–99)
Glucose-Capillary: 145 mg/dL — ABNORMAL HIGH (ref 70–99)
Glucose-Capillary: 146 mg/dL — ABNORMAL HIGH (ref 70–99)
Glucose-Capillary: 149 mg/dL — ABNORMAL HIGH (ref 70–99)
Glucose-Capillary: 82 mg/dL (ref 70–99)

## 2020-03-17 LAB — BASIC METABOLIC PANEL WITH GFR
Anion gap: 8 (ref 5–15)
BUN: 11 mg/dL (ref 6–20)
CO2: 26 mmol/L (ref 22–32)
Calcium: 8.5 mg/dL — ABNORMAL LOW (ref 8.9–10.3)
Chloride: 108 mmol/L (ref 98–111)
Creatinine, Ser: 0.78 mg/dL (ref 0.61–1.24)
GFR calc Af Amer: >60 mL/min (ref >60)
GFR calc non Af Amer: >60 mL/min (ref >60)
Glucose, Bld: 133 mg/dL — ABNORMAL HIGH (ref 70–99)
Potassium: 3.9 mmol/L (ref 3.5–5.1)
Sodium: 142 mmol/L (ref 135–145)

## 2020-03-17 MED ORDER — METOCLOPRAMIDE HCL 5 MG/ML IJ SOLN: 5.0000 mg | Freq: Three times a day (TID) | INTRAMUSCULAR | Status: DC | PRN

## 2020-03-17 MED ORDER — METOCLOPRAMIDE HCL 5 MG PO TABS
5.0000 mg | ORAL_TABLET | Freq: Three times a day (TID) | ORAL | Status: DC | PRN
  Filled 2020-03-17: qty 2

## 2020-03-17 MED ORDER — ONDANSETRON HCL 4 MG/2ML IJ SOLN: 4.0000 mg | Freq: Four times a day (QID) | INTRAMUSCULAR | Status: DC | PRN

## 2020-03-17 MED ORDER — MIDAZOLAM HCL 2 MG/2ML IJ SOLN
2.0000 mg | INTRAMUSCULAR | Status: DC | PRN
  Administered 2020-03-17: 4 mg via INTRAVENOUS
  Administered 2020-03-18 – 2020-03-19 (×2): 2 mg via INTRAVENOUS
  Filled 2020-03-17 (×3): qty 2

## 2020-03-17 MED ORDER — QUETIAPINE FUMARATE 25 MG PO TABS
50.0000 mg | ORAL_TABLET | Freq: Two times a day (BID) | ORAL | Status: DC
  Administered 2020-03-17 – 2020-03-18 (×3): 50 mg
  Filled 2020-03-17 (×3): qty 2

## 2020-03-17 MED ORDER — ENOXAPARIN SODIUM 40 MG/0.4ML ~~LOC~~ SOLN: 40.0000 mg | SUBCUTANEOUS | Status: DC

## 2020-03-17 MED ORDER — MIDAZOLAM HCL 2 MG/2ML IJ SOLN
INTRAMUSCULAR | Status: AC
  Filled 2020-03-17: qty 4

## 2020-03-17 MED ORDER — ENOXAPARIN SODIUM 30 MG/0.3ML ~~LOC~~ SOLN
30.0000 mg | Freq: Two times a day (BID) | SUBCUTANEOUS | Status: DC
  Administered 2020-03-17 – 2020-05-04 (×85): 30 mg via SUBCUTANEOUS
  Filled 2020-03-17 (×95): qty 0.3

## 2020-03-17 MED ORDER — ONDANSETRON HCL 4 MG PO TABS: 4.0000 mg | ORAL_TABLET | Freq: Four times a day (QID) | ORAL | Status: DC | PRN

## 2020-03-17 MED ORDER — DOCUSATE SODIUM 100 MG PO CAPS: 100.0000 mg | ORAL_CAPSULE | Freq: Two times a day (BID) | ORAL | Status: DC

## 2020-03-17 MED ORDER — QUETIAPINE FUMARATE 25 MG PO TABS: 50.0000 mg | ORAL_TABLET | Freq: Two times a day (BID) | ORAL | Status: DC

## 2020-03-17 MED ORDER — DOCUSATE SODIUM 50 MG/5ML PO LIQD
100.0000 mg | Freq: Two times a day (BID) | ORAL | Status: DC
  Administered 2020-03-17 – 2020-03-26 (×14): 100 mg
  Filled 2020-03-17 (×15): qty 10

## 2020-03-17 NOTE — Progress Notes (Signed)
4332- Patient back from OR. Propofol at rate of 57mcg/kg/min post surgery.  9518- Patient settled, propofol turned back down to 40 mcg/kg/min which is what the sedation was at before patient left for surgery.  0205- Patient's blood pressure >180. PRN dilaudid and hydralazine given. Bath given shortly after that. 0250- Patient's heart rate 130s, BP 200s per a-line, patient biting down hard on his tongue and shivering vigorously. Sedation and pain medicine gtt bolus given with no change in patient's status. Dr. Doylene Canard notified. New order for PRN versed 2-4 mg.

## 2020-03-17 NOTE — Progress Notes (Signed)
Transferred patient from OR back to 4N23 on ventilator without any complications.

## 2020-03-17 NOTE — Transfer of Care (Signed)
Immediate Anesthesia Transfer of Care Note  Patient: Anthony Macias  Procedure(s) Performed: OPEN REDUCTION INTERNAL FIXATION RIGHT PROXIMAL HUMEROUS FRACTURE AND CLAVICAL FRACTURE WITH BICEPS TENODESIS (Right Shoulder)  Patient Location: ICU  Anesthesia Type:General  Level of Consciousness: sedated and Patient remains intubated per anesthesia plan  Airway & Oxygen Therapy: Patient remains intubated per anesthesia plan and Patient placed on Ventilator (see vital sign flow sheet for setting)  Post-op Assessment: Report given to RN and Post -op Vital signs reviewed and stable  Post vital signs: Reviewed and stable  Last Vitals:  Vitals Value Taken Time  BP    Temp    Pulse 84 03/17/20 0021  Resp 16 03/17/20 0021  SpO2 100 % 03/17/20 0021  Vitals shown include unvalidated device data.  Last Pain:  Vitals:   03/16/20 1937  TempSrc: Oral         Complications: No complications documented.

## 2020-03-17 NOTE — Progress Notes (Addendum)
Trauma/Critical Care Follow Up Note  Subjective:    Overnight Issues:   Objective:  Vital signs for last 24 hours: Temp:  [99 F (37.2 C)-101.5 F (38.6 C)] 101.5 F (38.6 C) (07/20 1200) Pulse Rate:  [68-124] 100 (07/20 1500) Resp:  [15-19] 16 (07/20 1500) BP: (94-194)/(52-143) 129/80 (07/20 1500) SpO2:  [93 %-100 %] 99 % (07/20 1500) Arterial Line BP: (77-239)/(42-113) 120/73 (07/20 1500) FiO2 (%):  [30 %-40 %] 30 % (07/20 1113) Weight:  [80.8 kg] 80.8 kg (07/20 0500)  Hemodynamic parameters for last 24 hours:    Intake/Output from previous day: 07/19 0701 - 07/20 0700 In: 5364.9 [I.V.:3453.3; NG/GT:1161.6; IV Piggyback:750.1] Out: 6350 [Urine:5950; Blood:400]  Intake/Output this shift: Total I/O In: 1710.4 [I.V.:771.5; NG/GT:639.1; IV Piggyback:299.8] Out: 1825 [Urine:1825]  Vent settings for last 24 hours: Vent Mode: PRVC FiO2 (%):  [30 %-40 %] 30 % Set Rate:  [16 bmp] 16 bmp Vt Set:  [620 mL] 620 mL PEEP:  [5 cmH20] 5 cmH20 Plateau Pressure:  [10 cmH20-23 cmH20] 10 cmH20  Physical Exam:  Gen: comfortable, no distress Neuro: grossly non-focal, does not follow commands HEENT: intubated CV: RRR Pulm: unlabored breathing, mechanically ventilated Abd: soft, nontender GU: clear, yellow urine Extr: wwp, no edema   Results for orders placed or performed during the hospital encounter of 03/11/20 (from the past 24 hour(s))  Glucose, capillary     Status: Abnormal   Collection Time: 03/16/20  5:17 PM  Result Value Ref Range   Glucose-Capillary 113 (H) 70 - 99 mg/dL  Glucose, capillary     Status: None   Collection Time: 03/16/20  7:19 PM  Result Value Ref Range   Glucose-Capillary 82 70 - 99 mg/dL  Glucose, capillary     Status: Abnormal   Collection Time: 03/17/20 12:27 AM  Result Value Ref Range   Glucose-Capillary 111 (H) 70 - 99 mg/dL  Glucose, capillary     Status: Abnormal   Collection Time: 03/17/20  3:51 AM  Result Value Ref Range    Glucose-Capillary 119 (H) 70 - 99 mg/dL  CBC     Status: Abnormal   Collection Time: 03/17/20  4:01 AM  Result Value Ref Range   WBC 11.0 (H) 4.0 - 10.5 K/uL   RBC 2.71 (L) 4.22 - 5.81 MIL/uL   Hemoglobin 8.5 (L) 13.0 - 17.0 g/dL   HCT 96.2 (L) 39 - 52 %   MCV 90.4 80.0 - 100.0 fL   MCH 31.4 26.0 - 34.0 pg   MCHC 34.7 30.0 - 36.0 g/dL   RDW 22.9 79.8 - 92.1 %   Platelets 234 150 - 400 K/uL   nRBC 0.0 0.0 - 0.2 %  Basic metabolic panel     Status: Abnormal   Collection Time: 03/17/20  4:01 AM  Result Value Ref Range   Sodium 142 135 - 145 mmol/L   Potassium 3.9 3.5 - 5.1 mmol/L   Chloride 108 98 - 111 mmol/L   CO2 26 22 - 32 mmol/L   Glucose, Bld 133 (H) 70 - 99 mg/dL   BUN 11 6 - 20 mg/dL   Creatinine, Ser 1.94 0.61 - 1.24 mg/dL   Calcium 8.5 (L) 8.9 - 10.3 mg/dL   GFR calc non Af Amer >60 >60 mL/min   GFR calc Af Amer >60 >60 mL/min   Anion gap 8 5 - 15  Glucose, capillary     Status: Abnormal   Collection Time: 03/17/20  7:55 AM  Result Value  Ref Range   Glucose-Capillary 136 (H) 70 - 99 mg/dL  Glucose, capillary     Status: Abnormal   Collection Time: 03/17/20 12:09 PM  Result Value Ref Range   Glucose-Capillary 149 (H) 70 - 99 mg/dL    Assessment & Plan: The plan of care was discussed with the bedside nurse for the day, who is in agreement with this plan and no additional concerns were raised.   Present on Admission: **None**    LOS: 6 days   Additional comments:I reviewed the patient's new clinical lab test results.   and I reviewed the patients new imaging test results.    PHBC  TBI/F ICC/SDH/SAH/pneumocephalus- per Dr. Maisie Fus, Unasyn, Keppra, F/U CT H 7/15 expected enlargement ICCs, F/U CT H 7/17 no signif change, some evolving edema R orbit FX/R maxillary sinus FX/skull base FXs- per Dr. Uvaldo Rising R humeral head FX dislocation/R clavicle FX/R coracoid FX- relocated in ED by Dr. August Saucer, reconstruction by Dr. August Saucer 7/20 L distal femur FX- S/P ORIF by Dr.  Carola Frost 7/15 L bimalleolar ankle FX- S/P ORIF by Dr. Carola Frost 7/15 Pelvic ring FX- S/P B SI pinning by Dr. Carola Frost 7/15 L glenoid and scapula FXs- per Dr. Carola Frost R radial shaft FX- S/P ORIF by Dr. Carola Frost 7/15 L rib FX 2-4 ABL anemia - transfuse 1 u PRBC 7/17 - hgb stable Acute hypoxic ventilator dependent respiratory failure- poor weaning this AM, re-attempt this PM. Start seroquel to aid with agitation and vent weaning COVID +- not notablysymptomatic FEN- cont TF VTE- SCDs, LMWH Dispo- ICU  Critical Care Total Time: 60 minutes  Diamantina Monks, MD Trauma & General Surgery Please use [email or web address] to contact on call provider  03/17/2020  *Care during the described time interval was provided by me. I have reviewed this patient's available data, including medical history, events of note, physical examination and test results as part of my evaluation.

## 2020-03-18 ENCOUNTER — Encounter (HOSPITAL_COMMUNITY): Payer: Self-pay

## 2020-03-18 LAB — CBC
HCT: 25.9 % — ABNORMAL LOW (ref 39.0–52.0)
Hemoglobin: 8.5 g/dL — ABNORMAL LOW (ref 13.0–17.0)
MCH: 29.7 pg (ref 26.0–34.0)
MCHC: 32.8 g/dL (ref 30.0–36.0)
MCV: 90.6 fL (ref 80.0–100.0)
Platelets: 295 K/uL (ref 150–400)
RBC: 2.86 MIL/uL — ABNORMAL LOW (ref 4.22–5.81)
RDW: 14.4 % (ref 11.5–15.5)
WBC: 10.1 K/uL (ref 4.0–10.5)
nRBC: 0 % (ref 0.0–0.2)

## 2020-03-18 LAB — GLUCOSE, CAPILLARY
Glucose-Capillary: 134 mg/dL — ABNORMAL HIGH (ref 70–99)
Glucose-Capillary: 135 mg/dL — ABNORMAL HIGH (ref 70–99)
Glucose-Capillary: 138 mg/dL — ABNORMAL HIGH (ref 70–99)
Glucose-Capillary: 140 mg/dL — ABNORMAL HIGH (ref 70–99)
Glucose-Capillary: 141 mg/dL — ABNORMAL HIGH (ref 70–99)
Glucose-Capillary: 158 mg/dL — ABNORMAL HIGH (ref 70–99)

## 2020-03-18 LAB — BASIC METABOLIC PANEL WITH GFR
Anion gap: 7 (ref 5–15)
BUN: 14 mg/dL (ref 6–20)
CO2: 27 mmol/L (ref 22–32)
Calcium: 8.3 mg/dL — ABNORMAL LOW (ref 8.9–10.3)
Chloride: 105 mmol/L (ref 98–111)
Creatinine, Ser: 0.63 mg/dL (ref 0.61–1.24)
GFR calc Af Amer: >60 mL/min (ref >60)
GFR calc non Af Amer: >60 mL/min (ref >60)
Glucose, Bld: 152 mg/dL — ABNORMAL HIGH (ref 70–99)
Potassium: 4.1 mmol/L (ref 3.5–5.1)
Sodium: 139 mmol/L (ref 135–145)

## 2020-03-18 LAB — TRIGLYCERIDES: Triglycerides: 159 mg/dL — ABNORMAL HIGH (ref <150)

## 2020-03-18 LAB — PHOSPHORUS: Phosphorus: 3.4 mg/dL (ref 2.5–4.6)

## 2020-03-18 LAB — MAGNESIUM: Magnesium: 2.4 mg/dL (ref 1.7–2.4)

## 2020-03-18 MED ORDER — CLONAZEPAM 1 MG PO TABS
1.0000 mg | ORAL_TABLET | Freq: Two times a day (BID) | ORAL | Status: DC
  Administered 2020-03-18 – 2020-03-26 (×16): 1 mg
  Filled 2020-03-18 (×16): qty 1

## 2020-03-18 MED ORDER — BETHANECHOL CHLORIDE 10 MG PO TABS
10.0000 mg | ORAL_TABLET | Freq: Four times a day (QID) | ORAL | Status: DC
  Administered 2020-03-18 – 2020-03-19 (×2): 10 mg via ORAL
  Filled 2020-03-18 (×2): qty 1

## 2020-03-18 MED ORDER — QUETIAPINE FUMARATE 100 MG PO TABS
100.0000 mg | ORAL_TABLET | Freq: Two times a day (BID) | ORAL | Status: DC
  Administered 2020-03-18 – 2020-03-26 (×15): 100 mg
  Filled 2020-03-18 (×3): qty 1
  Filled 2020-03-18 (×3): qty 4
  Filled 2020-03-18 (×4): qty 1
  Filled 2020-03-18: qty 4
  Filled 2020-03-18 (×3): qty 1
  Filled 2020-03-18: qty 4

## 2020-03-18 MED ORDER — BISACODYL 10 MG RE SUPP
10.0000 mg | Freq: Once | RECTAL | Status: AC
  Administered 2020-03-18: 10 mg via RECTAL
  Filled 2020-03-18: qty 1
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
## 2020-03-18 NOTE — Progress Notes (Signed)
Foley was replaced today for retention. Contacted Trauma MD for order of urecholine. Verbal order for 10mg  urecholine TID.
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
## 2020-03-18 NOTE — Anesthesia Postprocedure Evaluation (Signed)
Anesthesia Post Note  Patient: Anthony Macias  Procedure(Macias) Performed: OPEN REDUCTION INTERNAL FIXATION RIGHT PROXIMAL HUMEROUS FRACTURE AND CLAVICAL FRACTURE WITH BICEPS TENODESIS (Right Shoulder)     Patient location during evaluation: SICU Anesthesia Type: General Level of consciousness: sedated Pain management: pain level controlled Vital Signs Assessment: post-procedure vital signs reviewed and stable Respiratory status: patient remains intubated per anesthesia plan Cardiovascular status: stable Postop Assessment: no apparent nausea or vomiting Anesthetic complications: no   No complications documented.  Last Vitals:  Vitals:   03/18/20 1000 03/18/20 1030  BP: (!) 141/73 140/73  Pulse: (!) 121 (!) 114  Resp: 19 18  Temp: (!) 38.3 C (!) 38.2 C  SpO2: 97% 97%    Last Pain:  Vitals:   03/18/20 0800  TempSrc: Axillary   Pain Goal:    LLE Motor Response: No movement to painful stimulus (03/18/20 1000)   RLE Motor Response: No movement to painful stimulus (03/18/20 1000)          Anthony Macias

## 2020-03-18 NOTE — Progress Notes (Signed)
Patient ID: Anthony Macias, male   DOB: 04/19/1981, 39 y.o.   MRN: [id number] Follow up - Trauma Critical Care  Patient Details:    Mitchel Delduca is an 39 y.o. male.  Lines/tubes : Airway 7.5 mm (Active)  Secured at (cm) 27 cm 03/18/20 0847  Measured From Lips 03/18/20 0847  Secured Location Right 03/18/20 0847  Secured By [employer] 03/18/20 0847  Tube Holder Repositioned Yes 03/18/20 0847  Cuff Pressure (cm H2O) 30 cm H2O 03/17/20 2042  Site Condition Dry 03/18/20 0847     CVC Triple Lumen 03/12/20 Left Subclavian 15 cm (Active)  Indication for Insertion or Continuance of Line Prolonged intravenous therapies 03/18/20 0716  Site Assessment Clean;Dry;Intact 03/18/20 0800  Proximal Lumen Status Infusing 03/18/20 0800  Medial Lumen Status Infusing 03/18/20 0800  Distal Lumen Status Saline locked;Flushed 03/18/20 0800  Dressing Type Transparent;Occlusive 03/18/20 0800  Dressing Status Clean;Dry;Intact;Antimicrobial disc in place 03/18/20 0800  Line Care Connections checked and tightened 03/17/20 2000  Dressing Intervention Dressing reinforced 03/12/20 2100  Dressing Change Due 03/19/20 03/17/20 2000     Arterial Line 03/12/20 Left Radial (Active)  Site Assessment Clean;Dry;Intact 03/18/20 0800  Line Status Pulsatile blood flow 03/18/20 0800  Art Line Waveform Appropriate 03/18/20 0800  Art Line Interventions Zeroed and calibrated;Leveled 03/18/20 0800  Color/Movement/Sensation Capillary refill less than 3 sec 03/18/20 0800  Dressing Type Transparent;Occlusive 03/18/20 0800  Dressing Status Clean;Dry;Intact 03/18/20 0800  Dressing Change Due 03/19/20 03/17/20 2000     NG/OG Tube Orogastric Center mouth Xray (Active)  External Length of Tube (cm) - (if applicable) 51 cm 03/17/20 2000  Site Assessment Clean;Dry;Intact 03/18/20 0800  Ongoing Placement Verification No change in respiratory status 03/18/20 0800  Status Infusing tube feed 03/18/20 0800  Amount  of suction 80 mmHg 03/13/20 0800  Drainage Appearance Bile 03/13/20 0800  Intake (mL) 180 mL 03/14/20 2200  Output (mL) 0 mL 03/13/20 0900     Urethral Catheter Pieter Partridge, RN Non-latex 16 Fr. (Active)  Output (mL) 750 mL 03/18/20 1000    Microbiology/Sepsis markers: Results for orders placed or performed during the hospital encounter of 03/11/20  SARS Coronavirus 2 by RT PCR (hospital order, performed in [facility] hospital lab) Nasopharyngeal Nasopharyngeal Swab     Status: Abnormal   Collection Time: 03/11/20 11:45 PM   Specimen: Nasopharyngeal Swab  Result Value Ref Range Status   SARS Coronavirus 2 POSITIVE (A) NEGATIVE Final    Comment: RESULT CALLED TO, READ BACK BY AND VERIFIED WITH: N FARMER RN 03/12/20 0135 JDW (NOTE) SARS-CoV-2 target nucleic acids are DETECTED  SARS-CoV-2 RNA is generally detectable in upper respiratory specimens  during the acute phase of infection.  Positive results are indicative  of the presence of the identified virus, but do not rule out bacterial infection or co-infection with other pathogens not detected by the test.  Clinical correlation with patient history and  other diagnostic information is necessary to determine patient infection status.  The expected result is negative.  Fact Sheet for Patients:   BoilerBrush.com.cy   Fact Sheet for Healthcare Providers:   https://pope.com/    This test is not yet approved or cleared by the [place] FDA and  has been authorized for detection and/or diagnosis of SARS-CoV-2 by FDA under an Emergency Use Authorization (EUA).  This EUA will remain in effect (meaning this test can  be used) for the duration of  the COVID-19 declaration under Section 564(b)(1) of the Act, 21 U.S.C.  section 360-bbb-3(b)(1), unless the authorization is terminated or revoked sooner.  Performed at [facility], [address], [place], [place] [address]     MRSA PCR Screening     Status: None   Collection Time: 03/12/20 12:51 AM   Specimen: Urine, Catheterized; Nasopharyngeal  Result Value Ref Range Status   MRSA by PCR NEGATIVE NEGATIVE Final    Comment:        The GeneXpert MRSA Assay (FDA approved for NASAL specimens only), is one component of a comprehensive MRSA colonization surveillance program. It is not intended to diagnose MRSA infection nor to guide or monitor treatment for MRSA infections. Performed at [facility], [address] [place], [place] [address]     Anti-infectives:  Anti-infectives (From admission, onward)   Start     Dose/Rate Route Frequency Ordered Stop   03/16/20 2049  vancomycin (VANCOCIN) powder  Status:  Discontinued          As needed 03/16/20 2049 03/16/20 2357   03/16/20 0600  ceFAZolin (ANCEF) IVPB 2g/100 mL premix  Status:  Discontinued        2 g 200 mL/hr over 30 Minutes Intravenous On call to O.R. 03/15/20 1920 03/17/20 0020   03/12/20 1215  ceFAZolin (ANCEF) IVPB 2g/100 mL premix        2 g 200 mL/hr over 30 Minutes Intravenous On call to O.R. 03/12/20 1026 03/12/20 1833   03/11/20 2345  Ampicillin-Sulbactam (UNASYN) 3 g in sodium chloride 0.9 % 100 mL IVPB     Discontinue     3 g 200 mL/hr over 30 Minutes Intravenous Every 6 hours 03/11/20 2344 03/18/20 2359    Consults: Treatment Team:  Myrene Galas, MD Bedelia Person, MD    Studies:    Events:  Subjective:    Overnight Issues:   Objective:  Vital signs for last 24 hours: Temp:  [99.7 F (37.6 C)-102 F (38.9 C)] 100.4 F (38 C) (07/21 1200) Pulse Rate:  [95-123] 104 (07/21 1200) Resp:  [15-21] 16 (07/21 1200) BP: (114-181)/(64-98) 128/74 (07/21 1200) SpO2:  [96 %-100 %] 99 % (07/21 1200) Arterial Line BP: (86-180)/(54-99) 107/64 (07/21 1200) FiO2 (%):  [30 %] 30 % (07/21 0847) Weight:  [80.4 kg] 80.4 kg (07/21 0500)  Hemodynamic parameters for last 24 hours:    Intake/Output from previous  day: 07/20 0701 - 07/21 0700 In: 4865.7 [I.V.:2346.9; NG/GT:1919.1; IV Piggyback:599.7] Out: 5050 [Urine:5050]  Intake/Output this shift: Total I/O In: 915.9 [I.V.:440.9; NG/GT:275; IV Piggyback:200] Out: 750 [Urine:750]  Vent settings for last 24 hours: Vent Mode: CPAP;PSV FiO2 (%):  [30 %] 30 % Set Rate:  [16 bmp] 16 bmp Vt Set:  [620 mL] 620 mL PEEP:  [5 cmH20] 5 cmH20 Pressure Support:  [10 cmH20] 10 cmH20 Plateau Pressure:  [10 cmH20-20 cmH20] 20 cmH20  Physical Exam:  General: no respiratory distress Neuro: ? local HEENT/Neck: ETT Resp: clear to auscultation bilaterally CVS: regular rate and rhythm, S1, S2 normal, no murmur, click, rub or gallop GI: soft, nontender, BS WNL, no r/g Extremities: ortho dressings  Results for orders placed or performed during the hospital encounter of 03/11/20 (from the past 24 hour(s))  Glucose, capillary     Status: Abnormal   Collection Time: 03/17/20  4:12 PM  Result Value Ref Range   Glucose-Capillary 145 (H) 70 - 99 mg/dL  Glucose, capillary     Status: Abnormal   Collection Time: 03/17/20  7:24 PM  Result Value Ref  Range   Glucose-Capillary 130 (H) 70 - 99 mg/dL  Glucose, capillary     Status: Abnormal   Collection Time: 03/17/20 11:52 PM  Result Value Ref Range   Glucose-Capillary 146 (H) 70 - 99 mg/dL  Glucose, capillary     Status: Abnormal   Collection Time: 03/18/20  4:10 AM  Result Value Ref Range   Glucose-Capillary 141 (H) 70 - 99 mg/dL  Triglycerides     Status: Abnormal   Collection Time: 03/18/20  4:13 AM  Result Value Ref Range   Triglycerides 159 (H) <150 mg/dL  CBC     Status: Abnormal   Collection Time: 03/18/20  4:13 AM  Result Value Ref Range   WBC 10.1 4.0 - 10.5 K/uL   RBC 2.86 (L) 4.22 - 5.81 MIL/uL   Hemoglobin 8.5 (L) 13.0 - 17.0 g/dL   HCT 70.0 (L) 39 - 52 %   MCV 90.6 80.0 - 100.0 fL   MCH 29.7 26.0 - 34.0 pg   MCHC 32.8 30.0 - 36.0 g/dL   RDW 17.4 94.4 - 96.7 %   Platelets 295 150 - 400 K/uL    nRBC 0.0 0.0 - 0.2 %  Basic metabolic panel     Status: Abnormal   Collection Time: 03/18/20  4:13 AM  Result Value Ref Range   Sodium 139 135 - 145 mmol/L   Potassium 4.1 3.5 - 5.1 mmol/L   Chloride 105 98 - 111 mmol/L   CO2 27 22 - 32 mmol/L   Glucose, Bld 152 (H) 70 - 99 mg/dL   BUN 14 6 - 20 mg/dL   Creatinine, Ser 5.91 0.61 - 1.24 mg/dL   Calcium 8.3 (L) 8.9 - 10.3 mg/dL   GFR calc non Af Amer >60 >60 mL/min   GFR calc Af Amer >60 >60 mL/min   Anion gap 7 5 - 15  Magnesium     Status: None   Collection Time: 03/18/20  4:13 AM  Result Value Ref Range   Magnesium 2.4 1.7 - 2.4 mg/dL  Phosphorus     Status: None   Collection Time: 03/18/20  4:13 AM  Result Value Ref Range   Phosphorus 3.4 2.5 - 4.6 mg/dL  Glucose, capillary     Status: Abnormal   Collection Time: 03/18/20  7:56 AM  Result Value Ref Range   Glucose-Capillary 158 (H) 70 - 99 mg/dL  Glucose, capillary     Status: Abnormal   Collection Time: 03/18/20 12:19 PM  Result Value Ref Range   Glucose-Capillary 135 (H) 70 - 99 mg/dL    Assessment & Plan: Present on Admission: **None**    LOS: 7 days   Additional comments:I reviewed the patient's new clinical lab test results. , PHBC  TBI/F ICC/SDH/SAH/pneumocephalus- per Dr. Maisie Fus, Unasyn, Keppra, F/U CT H 7/15 expected enlargement ICCs, F/U CT H 7/17 no signif change, some evolving edema R orbit FX/R maxillary sinus FX/skull base FXs- per Dr. Uvaldo Rising R humeral head FX dislocation/R clavicle FX/R coracoid FX- relocated in ED by Dr. August Saucer, reconstruction by Dr. August Saucer 7/20 L distal femur FX- S/P ORIF by Dr. Carola Frost 7/15 L bimalleolar ankle FX- S/P ORIF by Dr. Carola Frost 7/15 Pelvic ring FX- S/P B SI pinning by Dr. Carola Frost 7/15 L glenoid and scapula FXs- per Dr. Carola Frost R radial shaft FX- S/P ORIF by Dr. Carola Frost 7/15 L rib FX 2-4 ABL anemia - transfuse 1 u PRBC 7/17 - hgb stable Acute hypoxic ventilator dependent respiratory failure- poor weaning this  AM,  re-attempt this PM. Start seroquel to aid with agitation and vent weaning COVID +- not notablysymptomatic FEN- cont TF VTE- SCDs, LMWH Dispo- ICU, MS not ready to extubate, inc klon/sero Critical Care Total Time*: 45 Minutes  Violeta GelinasBurke Maryetta Shafer, MD, MPH, FACS Trauma & General Surgery Use [email or web address] to contact on call provider  03/18/2020  *Care during the described time interval was provided by me. I have reviewed this patient's available data, including medical history, events of note, physical examination and test results as part of my evaluation.

## 2020-03-19 ENCOUNTER — Encounter (HOSPITAL_COMMUNITY): Payer: Self-pay | Admitting: Orthopedic Surgery

## 2020-03-19 LAB — PHOSPHORUS: Phosphorus: 3.2 mg/dL (ref 2.5–4.6)

## 2020-03-19 LAB — GLUCOSE, CAPILLARY
Glucose-Capillary: 144 mg/dL — ABNORMAL HIGH (ref 70–99)
Glucose-Capillary: 143 mg/dL — ABNORMAL HIGH (ref 70–99)
Glucose-Capillary: 132 mg/dL — ABNORMAL HIGH (ref 70–99)
Glucose-Capillary: 135 mg/dL — ABNORMAL HIGH (ref 70–99)
Glucose-Capillary: 131 mg/dL — ABNORMAL HIGH (ref 70–99)

## 2020-03-19 LAB — MAGNESIUM: Magnesium: 2.4 mg/dL (ref 1.7–2.4)

## 2020-03-19 MED ORDER — BETHANECHOL CHLORIDE 10 MG PO TABS
25.0000 mg | ORAL_TABLET | Freq: Four times a day (QID) | ORAL | Status: DC
  Administered 2020-03-19 – 2020-03-21 (×7): 25 mg via ORAL
  Filled 2020-03-19 (×8): qty 3
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
## 2020-03-19 NOTE — Progress Notes (Signed)
Patient is POD2 s/p right shoulder greater tuberosity ORIF by Dr. August Saucer.  Remains intubated.  Postop dressing and splint in place without gross blood/drainage.  Shoulder with swelling but no deformity/dislocation.  Continue NWB of Rt shoulder and remain in sling at all times over the next 4 weeks.

## 2020-03-19 NOTE — Progress Notes (Signed)
Patient ID: Anthony Macias, male   DOB: 1981-05-13, 39 y.o.   MRN: [id number] Follow up - Trauma Critical Care  Patient Details:    Anthony Macias is an 39 y.o. male.  Lines/tubes : Airway 7.5 mm (Active)  Secured at (cm) 27 cm 03/19/20 0848  Measured From Lips 03/19/20 0848  Secured Location Left 03/19/20 0848  Secured By [employer] 03/19/20 0848  Tube Holder Repositioned Yes 03/19/20 0848  Cuff Pressure (cm H2O) 29 cm H2O 03/19/20 0848  Site Condition Dry;Cool 03/19/20 0848     CVC Triple Lumen 03/12/20 Left Subclavian 15 cm (Active)  Indication for Insertion or Continuance of Line Prolonged intravenous therapies 03/19/20 0800  Site Assessment Clean;Dry;Intact 03/18/20 2000  Proximal Lumen Status Infusing 03/18/20 2000  Medial Lumen Status Infusing 03/18/20 2000  Distal Lumen Status Flushed;Saline locked 03/18/20 2000  Dressing Type Transparent;Occlusive 03/18/20 2000  Dressing Status Clean;Dry;Intact;Antimicrobial disc in place 03/18/20 2000  Line Care Connections checked and tightened 03/18/20 2000  Dressing Intervention Dressing reinforced 03/12/20 2100  Dressing Change Due 03/19/20 03/18/20 2000     Arterial Line 03/12/20 Left Radial (Active)  Site Assessment Clean;Dry;Intact 03/18/20 2000  Line Status Pulsatile blood flow 03/18/20 2000  Art Line Waveform Appropriate 03/18/20 2000  Art Line Interventions Zeroed and calibrated;Connections checked and tightened 03/18/20 2000  Color/Movement/Sensation Capillary refill less than 3 sec;Cool fingers/toes 03/18/20 2000  Dressing Type Transparent;Occlusive 03/18/20 2000  Dressing Status Clean;Dry;Intact;Antimicrobial disc in place 03/18/20 2000  Dressing Change Due 03/19/20 03/18/20 2000     NG/OG Tube Orogastric Center mouth Xray (Active)  External Length of Tube (cm) - (if applicable) 51 cm 03/18/20 2000  Site Assessment Clean;Dry;Intact 03/18/20 2000  Ongoing Placement Verification No change in cm  markings or external length of tube from initial placement;No change in respiratory status;No acute changes, not attributed to clinical condition 03/18/20 2000  Status Infusing tube feed 03/18/20 2000  Amount of suction 80 mmHg 03/13/20 0800  Drainage Appearance Bile 03/13/20 0800  Intake (mL) 150 mL 03/19/20 0910  Output (mL) 0 mL 03/13/20 0900     Urethral Catheter Pieter Partridge, RN Non-latex 16 Fr. (Active)  Indication for Insertion or Continuance of Catheter Acute urinary retention (I&O Cath for 24 hrs prior to catheter insertion- Inpatient Only) 03/19/20 0800  Site Assessment Clean;Intact 03/19/20 0800  Catheter Maintenance Bag below level of bladder;Catheter secured;Insertion date on drainage bag;Drainage bag/tubing not touching floor;No dependent loops;Seal intact 03/19/20 0800  Collection Container Standard drainage bag 03/19/20 0800  Securement Method Securing device (Describe) 03/19/20 0800  Output (mL) 340 mL 03/19/20 0900    Microbiology/Sepsis markers: Results for orders placed or performed during the hospital encounter of 03/11/20  SARS Coronavirus 2 by RT PCR (hospital order, performed in [facility] hospital lab) Nasopharyngeal Nasopharyngeal Swab     Status: Abnormal   Collection Time: 03/11/20 11:45 PM   Specimen: Nasopharyngeal Swab  Result Value Ref Range Status   SARS Coronavirus 2 POSITIVE (A) NEGATIVE Final    Comment: RESULT CALLED TO, READ BACK BY AND VERIFIED WITH: N FARMER RN 03/12/20 0135 JDW (NOTE) SARS-CoV-2 target nucleic acids are DETECTED  SARS-CoV-2 RNA is generally detectable in upper respiratory specimens  during the acute phase of infection.  Positive results are indicative  of the presence of the identified virus, but do not rule out bacterial infection or co-infection with other pathogens not detected by the test.  Clinical correlation with patient history and  other diagnostic information is necessary  to determine patient infection status.   The expected result is negative.  Fact Sheet for Patients:   BoilerBrush.com.cy   Fact Sheet for Healthcare Providers:   https://pope.com/    This test is not yet approved or cleared by the [place] FDA and  has been authorized for detection and/or diagnosis of SARS-CoV-2 by FDA under an Emergency Use Authorization (EUA).  This EUA will remain in effect (meaning this test can  be used) for the duration of  the COVID-19 declaration under Section 564(b)(1) of the Act, 21 U.S.C. section 360-bbb-3(b)(1), unless the authorization is terminated or revoked sooner.  Performed at [facility], [address] [place], [place] [address]   MRSA PCR Screening     Status: None   Collection Time: 03/12/20 12:51 AM   Specimen: Urine, Catheterized; Nasopharyngeal  Result Value Ref Range Status   MRSA by PCR NEGATIVE NEGATIVE Final    Comment:        The GeneXpert MRSA Assay (FDA approved for NASAL specimens only), is one component of a comprehensive MRSA colonization surveillance program. It is not intended to diagnose MRSA infection nor to guide or monitor treatment for MRSA infections. Performed at [facility], [address], [place], [place] [address]     Anti-infectives:  Anti-infectives (From admission, onward)   Start     Dose/Rate Route Frequency Ordered Stop   03/16/20 2049  vancomycin (VANCOCIN) powder  Status:  Discontinued          As needed 03/16/20 2049 03/16/20 2357   03/16/20 0600  ceFAZolin (ANCEF) IVPB 2g/100 mL premix  Status:  Discontinued        2 g 200 mL/hr over 30 Minutes Intravenous On call to O.R. 03/15/20 1920 03/17/20 0020   03/12/20 1215  ceFAZolin (ANCEF) IVPB 2g/100 mL premix        2 g 200 mL/hr over 30 Minutes Intravenous On call to O.R. 03/12/20 1026 03/12/20 1833   03/11/20 2345  Ampicillin-Sulbactam (UNASYN) 3 g in sodium chloride 0.9 % 100 mL IVPB        3 g 200 mL/hr over 30  Minutes Intravenous Every 6 hours 03/11/20 2344 03/18/20 2359     Consults: Treatment Team:  Myrene Galas, MD Bedelia Person, MD    Studies:    Events:  Subjective:    Overnight Issues:   Objective:  Vital signs for last 24 hours: Temp:  [99.9 F (37.7 C)-101.7 F (38.7 C)] 100.4 F (38 C) (07/22 0900) Pulse Rate:  [94-123] 121 (07/22 0900) Resp:  [15-22] 22 (07/22 0900) BP: (94-157)/(57-113) 148/87 (07/22 0900) SpO2:  [97 %-100 %] 99 % (07/22 0900) Arterial Line BP: (87-196)/(53-98) 181/85 (07/22 0900) FiO2 (%):  [30 %] 30 % (07/22 0848) Weight:  [79.5 kg] 79.5 kg (07/22 0500)  Hemodynamic parameters for last 24 hours:    Intake/Output from previous day: 07/21 0701 - 07/22 0700 In: 4649.6 [I.V.:2229.5; NG/GT:1920; IV Piggyback:500.1] Out: 3725 [Urine:3725]  Intake/Output this shift: Total I/O In: 454.2 [I.V.:194.2; NG/GT:260] Out: 340 [Urine:340]  Vent settings for last 24 hours: Vent Mode: PSV;CPAP FiO2 (%):  [30 %] 30 % Set Rate:  [16 bmp] 16 bmp Vt Set:  [620 mL] 620 mL PEEP:  [5 cmH20] 5 cmH20 Pressure Support:  [10 cmH20] 10 cmH20 Plateau Pressure:  [20 cmH20-21 cmH20] 20 cmH20  Physical Exam:  General: no respiratory distress Neuro: localizes HEENT/Neck: ETT Resp: clear to auscultation bilaterally CVS: regular rate and rhythm,  S1, S2 normal, no murmur, click, rub or gallop GI: soft, nontender, BS WNL, no r/g Extremities: ortho dressings  Results for orders placed or performed during the hospital encounter of 03/11/20 (from the past 24 hour(s))  Glucose, capillary     Status: Abnormal   Collection Time: 03/18/20 12:19 PM  Result Value Ref Range   Glucose-Capillary 135 (H) 70 - 99 mg/dL  Glucose, capillary     Status: Abnormal   Collection Time: 03/18/20  4:35 PM  Result Value Ref Range   Glucose-Capillary 140 (H) 70 - 99 mg/dL  Glucose, capillary     Status: Abnormal   Collection Time: 03/18/20  7:44 PM  Result Value Ref Range    Glucose-Capillary 134 (H) 70 - 99 mg/dL  Glucose, capillary     Status: Abnormal   Collection Time: 03/18/20 11:46 PM  Result Value Ref Range   Glucose-Capillary 138 (H) 70 - 99 mg/dL  Glucose, capillary     Status: Abnormal   Collection Time: 03/19/20  3:18 AM  Result Value Ref Range   Glucose-Capillary 144 (H) 70 - 99 mg/dL  Magnesium     Status: None   Collection Time: 03/19/20  6:24 AM  Result Value Ref Range   Magnesium 2.4 1.7 - 2.4 mg/dL  Phosphorus     Status: None   Collection Time: 03/19/20  6:24 AM  Result Value Ref Range   Phosphorus 3.2 2.5 - 4.6 mg/dL  Glucose, capillary     Status: Abnormal   Collection Time: 03/19/20  9:01 AM  Result Value Ref Range   Glucose-Capillary 143 (H) 70 - 99 mg/dL    Assessment & Plan: Present on Admission: **None**    LOS: 8 days   Additional comments:I reviewed the patient's new clinical lab test results. Marland Kitchen PHBC  TBI/F ICC/SDH/SAH/pneumocephalus- per Dr. Maisie Fus, Unasyn, Keppra, F/U CT H 7/15 expected enlargement ICCs, F/U CT H 7/17 no signif change, some evolving edema R orbit FX/R maxillary sinus FX/skull base FXs- per Dr. Uvaldo Rising R humeral head FX dislocation/R clavicle FX/R coracoid FX- relocated in ED by Dr. August Saucer, reconstruction by Dr. August Saucer 7/20 L distal femur FX- S/P ORIF by Dr. Carola Frost 7/15 L bimalleolar ankle FX- S/P ORIF by Dr. Carola Frost 7/15 Pelvic ring FX- S/P B SI pinning by Dr. Carola Frost 7/15 L glenoid and scapula FXs- per Dr. Carola Frost R radial shaft FX- S/P ORIF by Dr. Carola Frost 7/15 L rib FX 2-4 ABL anemia - transfuse 1 u PRBC 7/17 - hgb stable Acute hypoxic ventilator dependent respiratory failure- poor weaning this AM, re-attempt this PM. Start seroquel to aid with agitation and vent weaning COVID +- not notablysymptomatic FEN- cont TF VTE- SCDs, LMWH Dispo- ICU, weaning better, MS not ready to extubate. I spoke with his close family friend, with whom he lives from time to time, and updated her. She has been  trying to keep his family in the loop. Critical Care Total Time*: 31 Minutes  Violeta Gelinas, MD, MPH, FACS Trauma & General Surgery Use [email or web address] to contact on call provider  03/19/2020  *Care during the described time interval was provided by me. I have reviewed this patient's available data, including medical history, events of note, physical examination and test results as part of my evaluation.

## 2020-03-19 NOTE — Progress Notes (Addendum)
Nutrition Follow-up  DOCUMENTATION CODES:   Not applicable  INTERVENTION:   Tube feeding via OG tube: Pivot 1.5 at 55 ml/h (1320 ml per day) Prosource TF 45 ml TID MVI with minerals daily  Provides 2100 kcal, 156 gm protein, 1001 ml free water daily  200 ml free water every 8 hours   TF regimen and propofol at current rate providing 2337 total kcal/day Total free water: 1601 ml    NUTRITION DIAGNOSIS:   Increased nutrient needs related to wound healing as evidenced by estimated needs. Ongoing.   GOAL:   Patient will meet greater than or equal to 90% of their needs Meeting with TF.   MONITOR:   TF tolerance, Vent status  REASON FOR ASSESSMENT:   Consult, Ventilator Enteral/tube feeding initiation and management  ASSESSMENT:   PHBC admitted with TBI, SDH, SAH, pneumocephalus, ICC, R orbit fx, R maxillary sinus fx, skull base fxs, R humeral head fx dislocation, R clavicle fx, R coracoid fx, L distal femur fx s/p ORIF 7/15, L ankle fx s/p ORIF 7/15, pelvic ring fx s/p pinning 7/15, L scapula fxs, R radial shaft fx s/p ORIF 7/15, and L rib fx 2-4. Pt covid + on admission.   Difficulty weaning per MD  Patient is currently intubated on ventilator support MV: 10.7 L/min  Temp (24hrs), Avg:100.8 F (38.2 C), Min:99.9 F (37.7 C), Max:101.7 F (38.7 C)  Propofol: 9 ml/hr provides: 237 kcal  Medications reviewed and include: vitamin C, colace, miralax, selenium  Neo @ 5 mcg  D5 NS @ 125 ml/hr  Labs reviewed     Diet Order:   Diet Order    None      EDUCATION NEEDS:   No education needs have been identified at this time  Skin:  Skin Assessment: Reviewed RN Assessment (multiple incisions)  Last BM:  7/21  Height:   Ht Readings from Last 1 Encounters:  03/11/20 (S) 6' (1.829 m)    Weight:   Wt Readings from Last 1 Encounters:  03/19/20 79.5 kg    Ideal Body Weight:  80.9 kg  BMI:  Body mass index is 23.77 kg/m.  Estimated Nutritional  Needs:   Kcal:  2200-2400  Protein:  135-160 grams  Fluid:  2 L/day  Cammy Copa., RD, LDN, CNSC See AMiON for contact information

## 2020-03-20 LAB — CBC
HCT: 25.7 % — ABNORMAL LOW (ref 39.0–52.0)
Hemoglobin: 8.4 g/dL — ABNORMAL LOW (ref 13.0–17.0)
MCH: 29.5 pg (ref 26.0–34.0)
MCHC: 32.7 g/dL (ref 30.0–36.0)
MCV: 90.2 fL (ref 80.0–100.0)
Platelets: 516 K/uL — ABNORMAL HIGH (ref 150–400)
RBC: 2.85 MIL/uL — ABNORMAL LOW (ref 4.22–5.81)
RDW: 14.1 % (ref 11.5–15.5)
WBC: 12.7 K/uL — ABNORMAL HIGH (ref 4.0–10.5)
nRBC: 0 % (ref 0.0–0.2)

## 2020-03-20 LAB — BASIC METABOLIC PANEL WITH GFR
Anion gap: 8 (ref 5–15)
BUN: 15 mg/dL (ref 6–20)
CO2: 27 mmol/L (ref 22–32)
Calcium: 8.6 mg/dL — ABNORMAL LOW (ref 8.9–10.3)
Chloride: 101 mmol/L (ref 98–111)
Creatinine, Ser: 0.62 mg/dL (ref 0.61–1.24)
GFR calc Af Amer: >60 mL/min (ref >60)
GFR calc non Af Amer: >60 mL/min (ref >60)
Glucose, Bld: 147 mg/dL — ABNORMAL HIGH (ref 70–99)
Potassium: 4 mmol/L (ref 3.5–5.1)
Sodium: 136 mmol/L (ref 135–145)

## 2020-03-20 LAB — GLUCOSE, CAPILLARY
Glucose-Capillary: 133 mg/dL — ABNORMAL HIGH (ref 70–99)
Glucose-Capillary: 134 mg/dL — ABNORMAL HIGH (ref 70–99)
Glucose-Capillary: 136 mg/dL — ABNORMAL HIGH (ref 70–99)
Glucose-Capillary: 143 mg/dL — ABNORMAL HIGH (ref 70–99)
Glucose-Capillary: 144 mg/dL — ABNORMAL HIGH (ref 70–99)
Glucose-Capillary: 147 mg/dL — ABNORMAL HIGH (ref 70–99)

## 2020-03-20 LAB — PHOSPHORUS: Phosphorus: 3.4 mg/dL (ref 2.5–4.6)

## 2020-03-20 LAB — MAGNESIUM: Magnesium: 2.3 mg/dL (ref 1.7–2.4)

## 2020-03-20 MED ORDER — SODIUM CHLORIDE 0.9% FLUSH
10.0000 mL | Freq: Two times a day (BID) | INTRAVENOUS | Status: DC
  Administered 2020-03-20: 20 mL
  Administered 2020-03-21 – 2020-03-23 (×7): 10 mL
  Administered 2020-03-24: 30 mL
  Administered 2020-03-24 – 2020-04-25 (×52): 10 mL

## 2020-03-20 MED ORDER — SODIUM CHLORIDE 0.9% FLUSH
10.0000 mL | INTRAVENOUS | Status: DC | PRN
  Administered 2020-03-24: 10 mL

## 2020-03-20 MED ORDER — METOPROLOL TARTRATE 5 MG/5ML IV SOLN
5.0000 mg | INTRAVENOUS | Status: DC | PRN
  Administered 2020-03-20 – 2020-04-02 (×10): 5 mg via INTRAVENOUS
  Filled 2020-03-20 (×11): qty 5

## 2020-03-20 MED ORDER — FENTANYL CITRATE (PF) 100 MCG/2ML IJ SOLN
100.0000 ug | INTRAMUSCULAR | Status: DC | PRN
  Administered 2020-03-20: 100 ug via INTRAVENOUS

## 2020-03-20 MED ORDER — FENTANYL CITRATE (PF) 100 MCG/2ML IJ SOLN
50.0000 ug | INTRAMUSCULAR | Status: DC | PRN
  Administered 2020-03-21: 50 ug via INTRAVENOUS
  Filled 2020-03-20 (×2): qty 2

## 2020-03-20 NOTE — Progress Notes (Signed)
Patient ID: Anthony Macias, male   DOB: 1981-04-17, 39 y.o.   MRN: [id number] Follow up - Trauma Critical Care  Patient Details:    Anthony Macias is an 39 y.o. male.  Lines/tubes : Airway 7.5 mm (Active)  Secured at (cm) 27 cm 03/20/20 0842  Measured From Lips 03/20/20 0842  Secured Location Center 03/20/20 0842  Secured By [employer] 03/20/20 0842  Tube Holder Repositioned Yes 03/20/20 0842  Cuff Pressure (cm H2O) 29 cm H2O 03/20/20 0842  Site Condition Dry 03/20/20 0842     CVC Triple Lumen 03/12/20 Left Subclavian 15 cm (Active)  Indication for Insertion or Continuance of Line Prolonged intravenous therapies 03/20/20 0900  Site Assessment Clean;Dry;Intact 03/20/20 0900  Proximal Lumen Status Infusing;Flushed 03/20/20 0900  Medial Lumen Status Infusing;Flushed 03/20/20 0900  Distal Lumen Status Flushed;Saline locked;Blood return noted 03/20/20 0900  Dressing Type Transparent;Occlusive 03/20/20 0900  Dressing Status Clean;Dry;Intact;Antimicrobial disc in place 03/20/20 0900  Line Care Medial tubing changed;Proximal tubing changed 03/19/20 1800  Dressing Intervention New dressing 03/19/20 0800  Dressing Change Due 03/26/20 03/19/20 0800     Arterial Line 03/12/20 Left Radial (Active)  Site Assessment Clean;Dry;Intact 03/20/20 0900  Line Status Pulsatile blood flow 03/20/20 0900  Art Line Waveform Appropriate 03/20/20 0900  Art Line Interventions Zeroed and calibrated 03/20/20 0900  Color/Movement/Sensation Capillary refill less than 3 sec 03/20/20 0900  Dressing Type Transparent;Occlusive 03/20/20 0900  Dressing Status Clean;Dry;Intact;Antimicrobial disc in place 03/20/20 0900  Interventions Dressing changed 03/20/20 0900  Dressing Change Due 03/26/20 03/20/20 0900     NG/OG Tube Orogastric Center mouth Xray (Active)  External Length of Tube (cm) - (if applicable) 51 cm 03/19/20 0800  Site Assessment Clean;Dry;Intact 03/20/20 0800  Ongoing Placement  Verification No change in cm markings or external length of tube from initial placement;No acute changes, not attributed to clinical condition 03/20/20 0800  Status Infusing tube feed 03/20/20 0800  Amount of suction 80 mmHg 03/13/20 0800  Drainage Appearance Bile 03/13/20 0800  Intake (mL) 100 mL 03/19/20 1500  Output (mL) 0 mL 03/13/20 0900     Urethral Catheter Pieter Partridge, RN Non-latex 16 Fr. (Active)  Indication for Insertion or Continuance of Catheter Acute urinary retention (I&O Cath for 24 hrs prior to catheter insertion- Inpatient Only) 03/20/20 0800  Site Assessment Clean;Intact 03/20/20 0800  Catheter Maintenance Bag below level of bladder;Drainage bag/tubing not touching floor;Catheter secured;No dependent loops;Seal intact 03/20/20 0800  Collection Container Standard drainage bag 03/20/20 0800  Securement Method Securing device (Describe) 03/20/20 0800  Output (mL) 450 mL 03/20/20 0800    Microbiology/Sepsis markers: Results for orders placed or performed during the hospital encounter of 03/11/20  SARS Coronavirus 2 by RT PCR (hospital order, performed in [facility] hospital lab) Nasopharyngeal Nasopharyngeal Swab     Status: Abnormal   Collection Time: 03/11/20 11:45 PM   Specimen: Nasopharyngeal Swab  Result Value Ref Range Status   SARS Coronavirus 2 POSITIVE (A) NEGATIVE Final    Comment: RESULT CALLED TO, READ BACK BY AND VERIFIED WITH: N FARMER RN 03/12/20 0135 JDW (NOTE) SARS-CoV-2 target nucleic acids are DETECTED  SARS-CoV-2 RNA is generally detectable in upper respiratory specimens  during the acute phase of infection.  Positive results are indicative  of the presence of the identified virus, but do not rule out bacterial infection or co-infection with other pathogens not detected by the test.  Clinical correlation with patient history and  other diagnostic information is necessary to determine patient  infection status.  The expected result is  negative.  Fact Sheet for Patients:   BoilerBrush.com.cy   Fact Sheet for Healthcare Providers:   https://pope.com/    This test is not yet approved or cleared by the [place] FDA and  has been authorized for detection and/or diagnosis of SARS-CoV-2 by FDA under an Emergency Use Authorization (EUA).  This EUA will remain in effect (meaning this test can  be used) for the duration of  the COVID-19 declaration under Section 564(b)(1) of the Act, 21 U.S.C. section 360-bbb-3(b)(1), unless the authorization is terminated or revoked sooner.  Performed at [facility], [address], [place], [place] [address]   MRSA PCR Screening     Status: None   Collection Time: 03/12/20 12:51 AM   Specimen: Urine, Catheterized; Nasopharyngeal  Result Value Ref Range Status   MRSA by PCR NEGATIVE NEGATIVE Final    Comment:        The GeneXpert MRSA Assay (FDA approved for NASAL specimens only), is one component of a comprehensive MRSA colonization surveillance program. It is not intended to diagnose MRSA infection nor to guide or monitor treatment for MRSA infections. Performed at [facility], [address] [place], [place] [address]     Anti-infectives:  Anti-infectives (From admission, onward)   Start     Dose/Rate Route Frequency Ordered Stop   03/16/20 2049  vancomycin (VANCOCIN) powder  Status:  Discontinued          As needed 03/16/20 2049 03/16/20 2357   03/16/20 0600  ceFAZolin (ANCEF) IVPB 2g/100 mL premix  Status:  Discontinued        2 g 200 mL/hr over 30 Minutes Intravenous On call to O.R. 03/15/20 1920 03/17/20 0020   03/12/20 1215  ceFAZolin (ANCEF) IVPB 2g/100 mL premix        2 g 200 mL/hr over 30 Minutes Intravenous On call to O.R. 03/12/20 1026 03/12/20 1833   03/11/20 2345  Ampicillin-Sulbactam (UNASYN) 3 g in sodium chloride 0.9 % 100 mL IVPB        3 g 200 mL/hr over 30 Minutes Intravenous Every 6  hours 03/11/20 2344 03/18/20 2359      Best Practice/Protocols:  VTE Prophylaxis: Lovenox (prophylaxtic dose) Continous Sedation  Consults: Treatment Team:  Myrene Galas, MD Bedelia Person, MD    Studies:    Events:  Subjective:    Overnight Issues:   Objective:  Vital signs for last 24 hours: Temp:  [99.7 F (37.6 C)-101.1 F (38.4 C)] 100 F (37.8 C) (07/23 0900) Pulse Rate:  [88-115] 96 (07/23 0900) Resp:  [16-21] 18 (07/23 0900) BP: (116-166)/(70-105) 145/99 (07/23 0900) SpO2:  [98 %-100 %] 100 % (07/23 0900) Arterial Line BP: (102-176)/(61-102) 158/98 (07/23 0900) FiO2 (%):  [30 %] 30 % (07/23 0842) Weight:  [80.1 kg] 80.1 kg (07/23 0438)  Hemodynamic parameters for last 24 hours:    Intake/Output from previous day: 07/22 0701 - 07/23 0700 In: 3921.7 [I.V.:2206.7; NG/GT:1715] Out: 3600 [Urine:3600]  Intake/Output this shift: Total I/O In: 142.9 [I.V.:87.9; NG/GT:55] Out: 450 [Urine:450]  Vent settings for last 24 hours: Vent Mode: PSV;CPAP FiO2 (%):  [30 %] 30 % Set Rate:  [16 bmp] 16 bmp Vt Set:  [620 mL] 620 mL PEEP:  [5 cmH20] 5 cmH20 Pressure Support:  [10 cmH20] 10 cmH20 Plateau Pressure:  [18 cmH20] 18 cmH20  Physical Exam:  General: no respiratory distress Neuro: clearly purposeful HEENT/Neck: ETT Resp: clear to  auscultation bilaterally CVS: regular rate and rhythm, S1, S2 normal, no murmur, click, rub or gallop GI: soft, nontender, BS WNL, no r/g Extremities: per ortho  Results for orders placed or performed during the hospital encounter of 03/11/20 (from the past 24 hour(s))  Glucose, capillary     Status: Abnormal   Collection Time: 03/19/20 11:56 AM  Result Value Ref Range   Glucose-Capillary 132 (H) 70 - 99 mg/dL  Glucose, capillary     Status: Abnormal   Collection Time: 03/19/20  3:29 PM  Result Value Ref Range   Glucose-Capillary 135 (H) 70 - 99 mg/dL  Glucose, capillary     Status: Abnormal   Collection Time:  03/19/20  8:38 PM  Result Value Ref Range   Glucose-Capillary 131 (H) 70 - 99 mg/dL  Glucose, capillary     Status: Abnormal   Collection Time: 03/20/20 12:50 AM  Result Value Ref Range   Glucose-Capillary 136 (H) 70 - 99 mg/dL  Glucose, capillary     Status: Abnormal   Collection Time: 03/20/20  4:31 AM  Result Value Ref Range   Glucose-Capillary 133 (H) 70 - 99 mg/dL  Magnesium     Status: None   Collection Time: 03/20/20  4:48 AM  Result Value Ref Range   Magnesium 2.3 1.7 - 2.4 mg/dL  Phosphorus     Status: None   Collection Time: 03/20/20  4:48 AM  Result Value Ref Range   Phosphorus 3.4 2.5 - 4.6 mg/dL  CBC     Status: Abnormal   Collection Time: 03/20/20  4:48 AM  Result Value Ref Range   WBC 12.7 (H) 4.0 - 10.5 K/uL   RBC 2.85 (L) 4.22 - 5.81 MIL/uL   Hemoglobin 8.4 (L) 13.0 - 17.0 g/dL   HCT 16.125.7 (L) 39 - 52 %   MCV 90.2 80.0 - 100.0 fL   MCH 29.5 26.0 - 34.0 pg   MCHC 32.7 30.0 - 36.0 g/dL   RDW 09.614.1 04.511.5 - 40.915.5 %   Platelets 516 (H) 150 - 400 K/uL   nRBC 0.0 0.0 - 0.2 %  Basic metabolic panel     Status: Abnormal   Collection Time: 03/20/20  4:48 AM  Result Value Ref Range   Sodium 136 135 - 145 mmol/L   Potassium 4.0 3.5 - 5.1 mmol/L   Chloride 101 98 - 111 mmol/L   CO2 27 22 - 32 mmol/L   Glucose, Bld 147 (H) 70 - 99 mg/dL   BUN 15 6 - 20 mg/dL   Creatinine, Ser 8.110.62 0.61 - 1.24 mg/dL   Calcium 8.6 (L) 8.9 - 10.3 mg/dL   GFR calc non Af Amer >60 >60 mL/min   GFR calc Af Amer >60 >60 mL/min   Anion gap 8 5 - 15  Glucose, capillary     Status: Abnormal   Collection Time: 03/20/20  8:22 AM  Result Value Ref Range   Glucose-Capillary 143 (H) 70 - 99 mg/dL    Assessment & Plan: Present on Admission: **None**    LOS: 9 days   Additional comments:I reviewed the patient's new clinical lab test results. Marland Kitchen. PHBC  TBI/F ICC/SDH/SAH/pneumocephalus- per Dr. Maisie Fushomas, completed Unasyn, Keppra, F/U CT H 7/15 expected enlargement ICCs, F/U CT H 7/17 no signif  change, some evolving edema R orbit FX/R maxillary sinus FX/skull base FXs- per Dr. Uvaldo RisingMcNeil R humeral head FX dislocation/R clavicle FX/R coracoid FX- relocated in ED by Dr. August Saucerean, reconstruction by Dr. August Saucerean 7/20 L  distal femur FX- S/P ORIF by Dr. Carola Frost 7/15 L bimalleolar ankle FX- S/P ORIF by Dr. Carola Frost 7/15 Pelvic ring FX- S/P B SI pinning by Dr. Carola Frost 7/15 L glenoid and scapula FXs- per Dr. Carola Frost R radial shaft FX- S/P ORIF by Dr. Carola Frost 7/15 L rib FX 2-4 ABL anemia - transfuse 1 u PRBC 7/17 - hgb stable Acute hypoxic ventilator dependent respiratory failure- weaning well - extubate now COVID +- not notablysymptomatic FEN- cont TF VTE- SCDs, LMWH Dispo- ICU, extubation Critical Care Total Time*: 45 Minutes  Violeta Gelinas, MD, MPH, FACS Trauma & General Surgery Use [email or web address] to contact on call provider  03/20/2020  *Care during the described time interval was provided by me. I have reviewed this patient's available data, including medical history, events of note, physical examination and test results as part of my evaluation.

## 2020-03-20 NOTE — Plan of Care (Signed)
  Problem: Nutrition: Goal: Adequate nutrition will be maintained Outcome: Progressing   

## 2020-03-20 NOTE — Procedures (Signed)
Extubation Procedure Note  Patient Details:   Name: Anthony Macias DOB: September 25, 1980 MRN: [id number]   Airway Documentation:    Vent end date: 03/20/20 Vent end time: 1205   Evaluation  O2 sats: stable throughout Complications: No apparent complications Patient did tolerate procedure well. Bilateral Breath Sounds: Diminished, Clear   Yes   Patient extubated per MD order. Positive cuff leak. No stridor noted. Vitals are stable on 40% venti mask. RN at bedside.   Harmon Dun Golden Emile 03/20/2020, 12:08 PM

## 2020-03-20 NOTE — Procedures (Signed)
Cortrak ° °Tube Type:  Cortrak - 43 inches °Tube Location:  Left nare °Initial Placement:  Stomach °Secured by: Bridle °Technique Used to Measure Tube Placement:  Documented cm marking at nare/ corner of mouth °Cortrak Secured At:  70 cm ° ° ° °Cortrak Tube Team Note: ° °Consult received to place a Cortrak feeding tube.  ° °No x-ray is required. RN may begin using tube.  ° °If the tube becomes dislodged please keep the tube and contact the Cortrak team at [email or web address] (password [email or web address]) for replacement.  °If after hours and replacement cannot be delayed, place a NG tube and confirm placement with an abdominal x-ray.  ° ° °Sharone Almond MS, RD, LDN °Please refer to AMION for RD and/or RD on-call/weekend/after hours pager ° °

## 2020-03-20 NOTE — TOC Progression Note (Signed)
Transition of Care [facility]) - Progression Note    Patient Details  Name: Anthony Macias MRN: [id number] Date of Birth: 16-Aug-1981  [facility]) CM/SW Contact  Astrid Drafts Berna Spare, RN Phone Number: 03/20/2020, 4:33 PM  Clinical Narrative:  Call returned from patient's brother Anthony Macias: He also confirms that patient was not homeless prior to admission, and was living on [address] with a lady friend.  Anthony Macias states that patient was working, delivering food and doing odd jobs.  Brother interested in getting help with applying for Medicaid for patient;  assured him that Financial Counsel[employer] would be notified to assist with Medicaid application, and that they may be contacting him to assist with this process.  He states that he will be able to help in any way possible.  Will follow.    Expected Discharge Plan: Skilled Nursing Facility Barriers to Discharge: Continued Medical Work up, Homeless with medical needs, Inadequate or no insurance  Expected Discharge Plan and Services Expected Discharge Plan: Skilled Nursing Facility   Discharge Planning Services: CM Consult   Living arrangements for the past 2 months: Homeless                                       Social Determinants of Health (SDOH) Interventions    Readmission Risk Interventions No flowsheet data found.  Anthony Baton, RN, BSN  Trauma/Neuro ICU Case Manager [phone]

## 2020-03-21 ENCOUNTER — Inpatient Hospital Stay (HOSPITAL_COMMUNITY): Payer: 59

## 2020-03-21 ENCOUNTER — Encounter (HOSPITAL_COMMUNITY): Payer: Self-pay | Admitting: Orthopedic Surgery

## 2020-03-21 LAB — GLUCOSE, CAPILLARY
Glucose-Capillary: 158 mg/dL — ABNORMAL HIGH (ref 70–99)
Glucose-Capillary: 126 mg/dL — ABNORMAL HIGH (ref 70–99)
Glucose-Capillary: 140 mg/dL — ABNORMAL HIGH (ref 70–99)
Glucose-Capillary: 165 mg/dL — ABNORMAL HIGH (ref 70–99)
Glucose-Capillary: 153 mg/dL — ABNORMAL HIGH (ref 70–99)

## 2020-03-21 LAB — BASIC METABOLIC PANEL WITH GFR
Anion gap: 8 (ref 5–15)
BUN: 17 mg/dL (ref 6–20)
CO2: 27 mmol/L (ref 22–32)
Calcium: 8.7 mg/dL — ABNORMAL LOW (ref 8.9–10.3)
Chloride: 101 mmol/L (ref 98–111)
Creatinine, Ser: 0.49 mg/dL — ABNORMAL LOW (ref 0.61–1.24)
GFR calc Af Amer: >60 mL/min (ref >60)
GFR calc non Af Amer: >60 mL/min (ref >60)
Glucose, Bld: 139 mg/dL — ABNORMAL HIGH (ref 70–99)
Potassium: 4.3 mmol/L (ref 3.5–5.1)
Sodium: 136 mmol/L (ref 135–145)

## 2020-03-21 LAB — CBC
HCT: 25.8 % — ABNORMAL LOW (ref 39.0–52.0)
Hemoglobin: 8.7 g/dL — ABNORMAL LOW (ref 13.0–17.0)
MCH: 30.6 pg (ref 26.0–34.0)
MCHC: 33.7 g/dL (ref 30.0–36.0)
MCV: 90.8 fL (ref 80.0–100.0)
Platelets: 642 K/uL — ABNORMAL HIGH (ref 150–400)
RBC: 2.84 MIL/uL — ABNORMAL LOW (ref 4.22–5.81)
RDW: 13.8 % (ref 11.5–15.5)
WBC: 20.8 K/uL — ABNORMAL HIGH (ref 4.0–10.5)
nRBC: 0 % (ref 0.0–0.2)

## 2020-03-21 LAB — TRIGLYCERIDES: Triglycerides: 132 mg/dL (ref <150)

## 2020-03-21 MED ORDER — BETHANECHOL CHLORIDE 25 MG PO TABS
25.0000 mg | ORAL_TABLET | Freq: Four times a day (QID) | ORAL | Status: DC
  Administered 2020-03-21 – 2020-03-26 (×19): 25 mg
  Filled 2020-03-21 (×12): qty 1
  Filled 2020-03-21 (×2): qty 3
  Filled 2020-03-21 (×5): qty 1
  Filled 2020-03-21: qty 3

## 2020-03-21 NOTE — Progress Notes (Signed)
SLP Cancellation Note  Patient Details Name: Hervey Wedig MRN: [id number] DOB: [birth year]   Cancelled treatment:       Reason Eval/Treat Not Completed: Medical issues which prohibited therapy.  Spoke with RN who stated that pt is currently on the Venturi Mask at this time. Per chart review, pt has a Cortrak in place.  SLP will f/u for bedside swallow evaluation and cognitive-linguistic evaluation as appropriate and as schedule allows.     Shanon Rosser Judia Arnott 03/21/2020, 1:10 PM
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
## 2020-03-22 LAB — GLUCOSE, CAPILLARY
Glucose-Capillary: 108 mg/dL — ABNORMAL HIGH (ref 70–99)
Glucose-Capillary: 114 mg/dL — ABNORMAL HIGH (ref 70–99)
Glucose-Capillary: 119 mg/dL — ABNORMAL HIGH (ref 70–99)
Glucose-Capillary: 136 mg/dL — ABNORMAL HIGH (ref 70–99)
Glucose-Capillary: 142 mg/dL — ABNORMAL HIGH (ref 70–99)
Glucose-Capillary: 145 mg/dL — ABNORMAL HIGH (ref 70–99)

## 2020-03-22 NOTE — Progress Notes (Signed)
Patient ID: Anthony Macias, male   DOB: Jan 15, 1981, 39 y.o.   MRN: [id number] Follow up - Trauma Critical Care  Patient Details:    Anthony Macias is an 39 y.o. male.  Lines/tubes : CVC Triple Lumen 03/12/20 Left Subclavian 15 cm (Active)  Indication for Insertion or Continuance of Line Prolonged intravenous therapies 03/20/20 2000  Site Assessment Clean;Dry;Intact 03/20/20 2000  Proximal Lumen Status Infusing 03/20/20 2000  Medial Lumen Status Flushed;Saline locked 03/20/20 2000  Distal Lumen Status In-line blood sampling system in place 03/20/20 2000  Dressing Type Transparent;Occlusive 03/20/20 2000  Dressing Status Clean;Dry;Intact;Antimicrobial disc in place 03/20/20 0900  Line Care Medial tubing changed;Proximal tubing changed 03/19/20 1800  Dressing Intervention New dressing 03/19/20 0800  Dressing Change Due 03/26/20 03/19/20 0800     Urethral Catheter Pieter Partridge, RN Non-latex 16 Fr. (Active)  Indication for Insertion or Continuance of Catheter Acute urinary retention (I&O Cath for 24 hrs prior to catheter insertion- Inpatient Only) 03/20/20 2000  Site Assessment Clean;Intact 03/20/20 2000  Catheter Maintenance Bag below level of bladder;Catheter secured;Drainage bag/tubing not touching floor;Insertion date on drainage bag;No dependent loops;Seal intact 03/20/20 2000  Collection Container Standard drainage bag 03/20/20 2000  Securement Method Securing device (Describe) 03/20/20 2000  Output (mL) 1750 mL 03/21/20 0200    Microbiology/Sepsis markers: Results for orders placed or performed during the hospital encounter of 03/11/20  SARS Coronavirus 2 by RT PCR (hospital order, performed in [facility] hospital lab) Nasopharyngeal Nasopharyngeal Swab     Status: Abnormal   Collection Time: 03/11/20 11:45 PM   Specimen: Nasopharyngeal Swab  Result Value Ref Range Status   SARS Coronavirus 2 POSITIVE (A) NEGATIVE Final    Comment: RESULT CALLED TO, READ BACK BY  AND VERIFIED WITH: N FARMER RN 03/12/20 0135 JDW (NOTE) SARS-CoV-2 target nucleic acids are DETECTED  SARS-CoV-2 RNA is generally detectable in upper respiratory specimens  during the acute phase of infection.  Positive results are indicative  of the presence of the identified virus, but do not rule out bacterial infection or co-infection with other pathogens not detected by the test.  Clinical correlation with patient history and  other diagnostic information is necessary to determine patient infection status.  The expected result is negative.  Fact Sheet for Patients:   BoilerBrush.com.cy   Fact Sheet for Healthcare Providers:   https://pope.com/    This test is not yet approved or cleared by the [place] FDA and  has been authorized for detection and/or diagnosis of SARS-CoV-2 by FDA under an Emergency Use Authorization (EUA).  This EUA will remain in effect (meaning this test can  be used) for the duration of  the COVID-19 declaration under Section 564(b)(1) of the Act, 21 U.S.C. section 360-bbb-3(b)(1), unless the authorization is terminated or revoked sooner.  Performed at [facility], [address], [place], [place] [address]   MRSA PCR Screening     Status: None   Collection Time: 03/12/20 12:51 AM   Specimen: Urine, Catheterized; Nasopharyngeal  Result Value Ref Range Status   MRSA by PCR NEGATIVE NEGATIVE Final    Comment:        The GeneXpert MRSA Assay (FDA approved for NASAL specimens only), is one component of a comprehensive MRSA colonization surveillance program. It is not intended to diagnose MRSA infection nor to guide or monitor treatment for MRSA infections. Performed at [facility], [address], [place], [place] [address]     Anti-infectives:  Anti-infectives (  From admission, onward)   Start     Dose/Rate Route Frequency Ordered Stop   03/16/20 2049  vancomycin (VANCOCIN)  powder  Status:  Discontinued          As needed 03/16/20 2049 03/16/20 2357   03/16/20 0600  ceFAZolin (ANCEF) IVPB 2g/100 mL premix  Status:  Discontinued        2 g 200 mL/hr over 30 Minutes Intravenous On call to O.R. 03/15/20 1920 03/17/20 0020   03/12/20 1215  ceFAZolin (ANCEF) IVPB 2g/100 mL premix        2 g 200 mL/hr over 30 Minutes Intravenous On call to O.R. 03/12/20 1026 03/12/20 1833   03/11/20 2345  Ampicillin-Sulbactam (UNASYN) 3 g in sodium chloride 0.9 % 100 mL IVPB        3 g 200 mL/hr over 30 Minutes Intravenous Every 6 hours 03/11/20 2344 03/18/20 2359      Best Practice/Protocols:  VTE Prophylaxis: Lovenox (prophylaxtic dose) .  Consults: Treatment Team:  Myrene Galas, MD Bedelia Person, MD    Studies:    Events:  Subjective:    Overnight Issues:   Objective:  Vital signs for last 24 hours: Temp:  [99.7 F (37.6 C)-101.1 F (38.4 C)] 100.6 F (38.1 C) (07/25 0900) Pulse Rate:  [79-116] 90 (07/25 0900) Resp:  [18-33] 24 (07/25 0900) BP: (142-162)/(79-112) 148/106 (07/25 0900) SpO2:  [95 %-100 %] 97 % (07/25 0900)  Hemodynamic parameters for last 24 hours:    Intake/Output from previous day: 07/24 0701 - 07/25 0700 In: 2198.7 [I.V.:493.7; NG/GT:1355] Out: 2450 [Urine:2450]  Intake/Output this shift: No intake/output data recorded.  Vent settings for last 24 hours:    Physical Exam:  General: alert and no respiratory distress Neuro: said some words, followed some commands HEENT/Neck: facial injuries Resp: clear to auscultation bilaterally CVS: RRR GI: soft, nontender, BS WNL, no r/g Extremities: edema 1+ and ortho dressings intact  Results for orders placed or performed during the hospital encounter of 03/11/20 (from the past 24 hour(s))  Glucose, capillary     Status: Abnormal   Collection Time: 03/21/20  3:07 PM  Result Value Ref Range   Glucose-Capillary 165 (H) 70 - 99 mg/dL  Glucose, capillary     Status: Abnormal    Collection Time: 03/21/20  9:10 PM  Result Value Ref Range   Glucose-Capillary 153 (H) 70 - 99 mg/dL  Glucose, capillary     Status: Abnormal   Collection Time: 03/22/20 12:39 AM  Result Value Ref Range   Glucose-Capillary 136 (H) 70 - 99 mg/dL  Glucose, capillary     Status: Abnormal   Collection Time: 03/22/20  6:07 AM  Result Value Ref Range   Glucose-Capillary 145 (H) 70 - 99 mg/dL  Glucose, capillary     Status: Abnormal   Collection Time: 03/22/20  8:07 AM  Result Value Ref Range   Glucose-Capillary 142 (H) 70 - 99 mg/dL    Assessment & Plan: Present on Admission: **None**    LOS: 11 days   Additional comments:I reviewed the patient's new clinical lab test results. Marland Kitchen PHBC  TBI/F ICC/SDH/SAH/pneumocephalus- per Dr. Maisie Fus, completed Unasyn, Keppra, F/U CT H 7/15 expected enlargement ICCs, F/U CT H 7/17 no signif change, exam improving, TBI team therapies R orbit FX/R maxillary sinus FX/skull base FXs- per Dr. Uvaldo Rising R humeral head FX dislocation/R clavicle FX/R coracoid FX- relocated in ED by Dr. August Saucer, reconstruction by Dr. August Saucer 7/20 L distal femur FX- S/P ORIF by  Dr. Carola Frost 7/15 L bimalleolar ankle FX- S/P ORIF by Dr. Carola Frost 7/15 Pelvic ring FX- S/P B SI pinning by Dr. Carola Frost 7/15 L glenoid and scapula FXs- per Dr. Carola Frost R radial shaft FX- S/P ORIF by Dr. Carola Frost 7/15 L rib FX 2-4 ABL anemia - transfuse 1 u PRBC 7/17 - hgb stable Acute hypoxic ventilator dependent respiratory failure- tolerated extubation 7/23 COVID +- not notablysymptomatic FEN- cont TF, SLP eval VTE- SCDs, LMWH ID- WBC up yesterday, low grade temps. Recheck labs in AM Dispo- ICU, PT/OT/ST, anticipate CIR Critical Care Total Time*: 31 Minutes  Berna Bue MD Trauma & General Surgery Use [email or web address] to contact on call provider  03/22/2020  *Care during the described time interval was provided by me. I have reviewed this patient's available data, including medical history, events of  note, physical examination and test results as part of my evaluation.

## 2020-03-22 NOTE — Progress Notes (Signed)
Patient ID: Anthony Macias, male   DOB: June 23, 1981, 39 y.o.   MRN: [id number] BP (!) 142/96   Pulse 102   Temp 99.7 F (37.6 C)   Resp 23   Ht (S) 6' (1.829 m) Comment: ;measured height  Wt 80.1 kg   SpO2 100%   BMI 23.95 kg/m  Lethargic, follows commands Oriented at least to person Moving all extremities No need for MRI
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
## 2020-03-23 ENCOUNTER — Encounter (HOSPITAL_COMMUNITY): Payer: Self-pay

## 2020-03-23 ENCOUNTER — Inpatient Hospital Stay (HOSPITAL_COMMUNITY): Payer: 59

## 2020-03-23 DIAGNOSIS — S069X6D Unspecified intracranial injury with loss of consciousness greater than 24 hours without return to pre-existing conscious level with patient surviving, subsequent encounter: Secondary | ICD-10-CM

## 2020-03-23 LAB — CBC
HCT: 28.1 % — ABNORMAL LOW (ref 39.0–52.0)
Hemoglobin: 9.5 g/dL — ABNORMAL LOW (ref 13.0–17.0)
MCH: 30.3 pg (ref 26.0–34.0)
MCHC: 33.8 g/dL (ref 30.0–36.0)
MCV: 89.5 fL (ref 80.0–100.0)
Platelets: 848 K/uL — ABNORMAL HIGH (ref 150–400)
RBC: 3.14 MIL/uL — ABNORMAL LOW (ref 4.22–5.81)
RDW: 14.1 % (ref 11.5–15.5)
WBC: 21.8 K/uL — ABNORMAL HIGH (ref 4.0–10.5)
nRBC: 0.1 % (ref 0.0–0.2)

## 2020-03-23 LAB — GLUCOSE, CAPILLARY
Glucose-Capillary: 105 mg/dL — ABNORMAL HIGH (ref 70–99)
Glucose-Capillary: 117 mg/dL — ABNORMAL HIGH (ref 70–99)
Glucose-Capillary: 120 mg/dL — ABNORMAL HIGH (ref 70–99)
Glucose-Capillary: 136 mg/dL — ABNORMAL HIGH (ref 70–99)
Glucose-Capillary: 140 mg/dL — ABNORMAL HIGH (ref 70–99)
Glucose-Capillary: 147 mg/dL — ABNORMAL HIGH (ref 70–99)

## 2020-03-23 LAB — BASIC METABOLIC PANEL WITH GFR
Anion gap: 9 (ref 5–15)
BUN: 21 mg/dL — ABNORMAL HIGH (ref 6–20)
CO2: 27 mmol/L (ref 22–32)
Calcium: 8.8 mg/dL — ABNORMAL LOW (ref 8.9–10.3)
Chloride: 100 mmol/L (ref 98–111)
Creatinine, Ser: 0.72 mg/dL (ref 0.61–1.24)
GFR calc Af Amer: >60 mL/min (ref >60)
GFR calc non Af Amer: >60 mL/min (ref >60)
Glucose, Bld: 132 mg/dL — ABNORMAL HIGH (ref 70–99)
Potassium: 3.7 mmol/L (ref 3.5–5.1)
Sodium: 136 mmol/L (ref 135–145)

## 2020-03-23 LAB — MAGNESIUM: Magnesium: 2.3 mg/dL (ref 1.7–2.4)

## 2020-03-23 MED ORDER — HYDROMORPHONE HCL 1 MG/ML IJ SOLN
1.0000 mg | Freq: Four times a day (QID) | INTRAMUSCULAR | Status: DC | PRN
  Administered 2020-03-23 – 2020-04-02 (×11): 1 mg via INTRAVENOUS
  Filled 2020-03-23 (×12): qty 1

## 2020-03-23 MED ORDER — OXYCODONE HCL 5 MG/5ML PO SOLN
5.0000 mg | ORAL | Status: DC | PRN
  Administered 2020-03-25 – 2020-03-26 (×3): 10 mg
  Filled 2020-03-23 (×3): qty 10

## 2020-03-23 MED ORDER — FREE WATER
200.0000 mL | Status: DC
  Administered 2020-03-23 – 2020-03-24 (×7): 200 mL

## 2020-03-23 NOTE — Plan of Care (Signed)
  Problem: Clinical Measurements: Goal: Respiratory complications will improve Outcome: Progressing   

## 2020-03-23 NOTE — Progress Notes (Signed)
Thank you for consult on Mr. Anthony Macias. Note that he's 12 days post Covid diagnosis and still on respiratory precautions. Will follow along for progress/participation for now. He will need to be 21 days out to be cleared for CIR.

## 2020-03-23 NOTE — Progress Notes (Signed)
Patient ID: Anthony Macias, male   DOB: [birth year], 39 y.o.   MRN: [id number]    7 Days Post-Op  Subjective: Pulled Cortrak out overnight.  Bridle still in nose.  Patient can tell me his last name is Anthony Macias, can't tell me his first name, place, time, etc.  He just says "that's my name, you are wearing it out".  Low grade temp of 100.9  ROS: unable due to TBI  Objective: Vital signs in last 24 hours: Temp:  [99.3 F (37.4 C)-100.8 F (38.2 C)] 100 F (37.8 C) (07/26 0359) Pulse Rate:  [90-126] 114 (07/26 0359) Resp:  [15-29] 20 (07/26 0359) BP: (115-154)/(78-111) 134/94 (07/26 0359) SpO2:  [91 %-100 %] 91 % (07/26 0359) Weight:  [82 kg-82.4 kg] 82 kg (07/26 0359) Last BM Date: 03/22/20  Intake/Output from previous day: 07/25 0701 - 07/26 0700 In: 277 [NG/GT:277] Out: 1900 [Urine:1900] Intake/Output this shift: No intake/output data recorded.  PE: Gen: alert at times, not oriented really HEENT: right eye abrasion, otherwise doesn't really open his eyes for me.  Bridle in nose, no cortrak Neck: trachea midline, c-collar in place Heart: regular, but mildly tachy Lungs/chest: relatively clear bilaterally, some decrease at bases.  Dressing in place over RUC and clavicle area. Abd: soft, NT, ND GU: foley in place with clear yellow urine MSK: RUE in splint and sling, moves fingers, unable to determine if sensation is intact due to mental status.  L hand in mitten and restraint.  Good cap refill on B hands.  LLE in ace wrap and splint.  Moves RLE to command Psych: alert but not oriented except maybe to last name.  When I ask if he is Anthony Macias, he just says "that's my name, don't wear it out"  Unable to tell me his first name or anything else of significance.  Lab Results:  Recent Labs    03/21/20 0500 03/23/20 0400  WBC 20.8* 21.8*  HGB 8.7* 9.5*  HCT 25.8* 28.1*  PLT 642* 848*   BMET Recent Labs    03/21/20 0500 03/23/20 0400  NA 136 136  K 4.3 3.7  CL 101 100    CO2 27 27  GLUCOSE 139* 132*  BUN 17 21*  CREATININE 0.49* 0.72  CALCIUM 8.7* 8.8*   PT/INR No results for input(s): LABPROT, INR in the last 72 hours. CMP     Component Value Date/Time   NA 136 03/23/2020 0400   K 3.7 03/23/2020 0400   CL 100 03/23/2020 0400   CO2 27 03/23/2020 0400   GLUCOSE 132 (H) 03/23/2020 0400   BUN 21 (H) 03/23/2020 0400   CREATININE 0.72 03/23/2020 0400   CALCIUM 8.8 (L) 03/23/2020 0400   PROT 6.0 (L) 03/12/2020 0126   ALBUMIN 3.6 03/12/2020 0126   AST 228 (H) 03/12/2020 0126   ALT 96 (H) 03/12/2020 0126   ALKPHOS 51 03/12/2020 0126   BILITOT 0.6 03/12/2020 0126   GFRNONAA >60 03/23/2020 0400   GFRAA >60 03/23/2020 0400   Lipase  No results found for: LIPASE     Studies/Results: No results found.  Anti-infectives: Anti-infectives (From admission, onward)   Start     Dose/Rate Route Frequency Ordered Stop   03/16/20 2049  vancomycin (VANCOCIN) powder  Status:  Discontinued          As needed 03/16/20 2049 03/16/20 2357   03/16/20 0600  ceFAZolin (ANCEF) IVPB 2g/100 mL premix  Status:  Discontinued        2  g 200 mL/hr over 30 Minutes Intravenous On call to O.R. 03/15/20 1920 03/17/20 0020   03/12/20 1215  ceFAZolin (ANCEF) IVPB 2g/100 mL premix        2 g 200 mL/hr over 30 Minutes Intravenous On call to O.R. 03/12/20 1026 03/12/20 1833   03/11/20 2345  Ampicillin-Sulbactam (UNASYN) 3 g in sodium chloride 0.9 % 100 mL IVPB        3 g 200 mL/hr over 30 Minutes Intravenous Every 6 hours 03/11/20 2344 03/18/20 2359       Assessment/Plan PHBC  TBI/F ICC/SDH/SAH/pneumocephalus- per Dr. Maisie Fus, completed Unasyn, Keppra, F/U CT H 7/15 expected enlargement ICCs, F/U CT H 7/17 no signif change, exam improving, TBI team therapies R orbit FX/R maxillary sinus FX/skull base FXs- per Dr. Uvaldo Rising R humeral head FX dislocation/R clavicle FX/R coracoid FX- relocated in ED by Dr. August Saucer, reconstruction by Dr. August Saucer 7/20 L distal femur FX- S/P  ORIF by Dr. Carola Frost 7/15 L bimalleolar ankle FX- S/P ORIF by Dr. Carola Frost 7/15 Pelvic ring FX- S/P B SI pinning by Dr. Carola Frost 7/15 L glenoid and scapula FXs- per Dr. Carola Frost R radial shaft FX- S/P ORIF by Dr. Carola Frost 7/15 L rib FX 2-4 ABL anemia - transfuse 1 u PRBC 7/17-hgb stable Acute hypoxic ventilator dependent respiratory failure- tolerated extubation 7/23, sats are ok, last CXR with worsening infiltrates.  WBC up in last 4 days from 10 to 21K and low grade temps.  Repeat CXR today and ? Respiratory cx if doesn't improve in next day or so. COVID +- not notablysymptomatic FEN- cont TF after Cortrak replaced today, SLP eval, DC foley VTE- SCDs, LMWH ID- WBC up yesterday, low grade temps. CXR.  Follow CBC Dispo- 5W, PT/OT/ST, anticipate CIR   LOS: 12 days    [name] , [facility] Surgery 03/23/2020, 8:16 AM Please see Amion for pager number during day hours 7:00am-4:30pm or 7:00am -11:30am on weekends
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
## 2020-03-23 NOTE — Procedures (Signed)
Cortrak  Person Inserting Tube:  Macias, Remee Charley E, RD Tube Type:  Cortrak - 43 inches Tube Location:  Left nare Initial Placement:  Stomach Secured by: Bridle Technique Used to Measure Tube Placement:  Documented cm marking at nare/ corner of mouth Cortrak Secured At:  70 cm    Cortrak Tube Team Note:  Consult received to place a Cortrak feeding tube.   No x-ray is required. RN may begin using tube.   If the tube becomes dislodged please keep the tube and contact the Cortrak team at [email or web address] ([email or web address]) for replacement.  If after hours and replacement cannot be delayed, place a NG tube and confirm placement with an abdominal x-ray.   Anthony Batta King, MS, RD, LDN Pager number available on Amion 

## 2020-03-23 NOTE — Progress Notes (Signed)
Nutrition Follow-up  DOCUMENTATION CODES:   Not applicable  INTERVENTION:   -Continue Pivot 1.5 @ 55 ml/hr via cortrak   45 ml Prostat TID.    200 ml free water flushes every 4 hours  Tube feeding regimen provides 2100 kcal (95% of needs), 156 grams of protein, and 1001 ml of H2O. Total free water: 2201 ml free water daily  NUTRITION DIAGNOSIS:   Increased nutrient needs related to wound healing as evidenced by estimated needs.  Ongoing  GOAL:   Patient will meet greater than or equal to 90% of their needs  Met with TF  MONITOR:   Diet advancement, Labs, Weight trends, TF tolerance, Skin, I & O's  REASON FOR ASSESSMENT:   Consult, Ventilator Enteral/tube feeding initiation and management  ASSESSMENT:   [facility] admitted with TBI, SDH, SAH, pneumocephalus, ICC, R orbit fx, R maxillary sinus fx, skull base fxs, R humeral head fx dislocation, R clavicle fx, R coracoid fx, L distal femur fx s/p ORIF 7/15, L ankle fx s/p ORIF 7/15, pelvic ring fx s/p pinning 7/15, L scapula fxs, R radial shaft fx s/p ORIF 7/15, and L rib fx 2-4. Pt covid + on admission.  7/23- extubated 7/26- pulled out cortrak overnight, cortrak tube replaced- tip of tube confirmed in stomach  Reviewed I/O's: -1.6 L x 24 hours and +12.6 L snce admission  UOP: 1.9 L x 24 hours  Attempted to speak with pt via phone, however, no answer. Per chart review, pt remains confused.   Pt pulled out cortrak tube overnight; replaced today. Per SLP notes, pt refused PO intake during BSE on 03/22/20. Pt remains NPO and receiving TF via cortrak.   Medications reviewed and include miralax.   Labs reviewed:CBGS: 105-120 (inpatient orders for glycemic control are none).   Diet Order:   Diet Order    None      EDUCATION NEEDS:   No education needs have been identified at this time  Skin:  Skin Assessment: Skin Integrity Issues: Skin Integrity Issues:: Other (Comment), Incisions Incisions: rt arm, lt leg, lt  thigh, lt hip Other: rt eye laceration  Last BM:  03/23/20  Height:   Ht Readings from Last 1 Encounters:  03/22/20 '5\' 11"'  (1.803 m)    Weight:   Wt Readings from Last 1 Encounters:  03/23/20 82 kg    Ideal Body Weight:  80.9 kg  BMI:  Body mass index is 25.21 kg/m.  Estimated Nutritional Needs:   Kcal:  2200-2400  Protein:  135-160 grams  Fluid:  2 L/day    Loistine Chance, RD, LDN, [place] Registered Dietitian II Certified Diabetes Care and Education Specialist Please refer to [facility] for RD and/or RD on-call/weekend/after hours pager

## 2020-03-23 NOTE — Progress Notes (Signed)
Inpatient Rehabilitation Admissions Coordinator  Patient COVID + 03/11/20. Patients  are eligible for possible CIR admit once >20 days since positive, and have recovered or improved from COVID. They do not require retesting for possible admission. Those patients who are less than 20 days from onset, still require 2 negative tests prior to possible admission to [facility] CIR. This patient does not meet that criteria. I will follow his progress. Please call me with any questions.  Ottie Glazier, RN, MSN Rehab Admissions Coordinator [phone] 03/23/2020 12:56 PM

## 2020-03-24 ENCOUNTER — Inpatient Hospital Stay (HOSPITAL_COMMUNITY): Payer: 59

## 2020-03-24 LAB — CBC
HCT: 28.5 % — ABNORMAL LOW (ref 39.0–52.0)
Hemoglobin: 9.6 g/dL — ABNORMAL LOW (ref 13.0–17.0)
MCH: 29.9 pg (ref 26.0–34.0)
MCHC: 33.7 g/dL (ref 30.0–36.0)
MCV: 88.8 fL (ref 80.0–100.0)
Platelets: 891 K/uL — ABNORMAL HIGH (ref 150–400)
RBC: 3.21 MIL/uL — ABNORMAL LOW (ref 4.22–5.81)
RDW: 14 % (ref 11.5–15.5)
WBC: 17.9 K/uL — ABNORMAL HIGH (ref 4.0–10.5)
nRBC: 0 % (ref 0.0–0.2)

## 2020-03-24 LAB — TRIGLYCERIDES: Triglycerides: 125 mg/dL (ref <150)

## 2020-03-24 LAB — GLUCOSE, CAPILLARY
Glucose-Capillary: 113 mg/dL — ABNORMAL HIGH (ref 70–99)
Glucose-Capillary: 117 mg/dL — ABNORMAL HIGH (ref 70–99)
Glucose-Capillary: 120 mg/dL — ABNORMAL HIGH (ref 70–99)
Glucose-Capillary: 122 mg/dL — ABNORMAL HIGH (ref 70–99)
Glucose-Capillary: 134 mg/dL — ABNORMAL HIGH (ref 70–99)
Glucose-Capillary: 137 mg/dL — ABNORMAL HIGH (ref 70–99)
Glucose-Capillary: 147 mg/dL — ABNORMAL HIGH (ref 70–99)

## 2020-03-24 NOTE — Progress Notes (Signed)
   03/24/20 1206  Vitals  Temp 98 F (36.7 C)  Temp Source Axillary  BP (!) 126/93  MAP (mmHg) 101  BP Location Left Arm  BP Method Automatic  Patient Position (if appropriate) Lying  Pulse Rate (!) 118  Pulse Rate Source Monitor  Resp 20  MEWS COLOR  MEWS Score Color Yellow  Oxygen Therapy  SpO2 100 %  O2 Device Room Air  Pain Assessment  Pain Scale Faces  Faces Pain Scale 6  Pain Intervention(s) Medication (See eMAR)  MEWS Score  MEWS Temp 0  MEWS Systolic 0  MEWS Pulse 2  MEWS RR 0  MEWS LOC 1  MEWS Score 3

## 2020-03-24 NOTE — Plan of Care (Signed)
  Problem: Clinical Measurements: Goal: Respiratory complications will improve Outcome: Progressing   
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
## 2020-03-24 NOTE — Progress Notes (Signed)
Patient ID: Anthony Macias, male   DOB: [birth year], 39 y.o.   MRN: [id number]    8 Days Post-Op  Subjective: Low grade fevers essentially resolved.  WBC down to 17 today.  Doesn't respond to me today at all.  Will barely try to open his eyes to a sternal rub  ROS: unable  Objective: Vital signs in last 24 hours: Temp:  [98.6 F (37 C)-99.9 F (37.7 C)] 98.6 F (37 C) (07/27 0745) Pulse Rate:  [95-106] 99 (07/27 0745) Resp:  [16-20] 19 (07/27 0745) BP: (120-145)/(85-110) 136/105 (07/27 0745) SpO2:  [91 %-100 %] 100 % (07/27 0745) Weight:  [73.7 kg] 73.7 kg (07/27 0400) Last BM Date: 03/23/20  Intake/Output from previous day: 07/26 0701 - 07/27 0700 In: 558.9 [I.V.:30; NG/GT:528.9] Out: 2150 [Urine:2150] Intake/Output this shift: Total I/O In: 30 [I.V.:30] Out: -   PE: Gen: NAD, but lethargic today and won't respond to me HEENT: eye abrasion is stable Neck: c-collar in place Heart: tachy, which is new but likely from agitation as he is getting positioned for at least 6 different portable films currently. HR has been normal otherwise during vital checks Lungs: CTAB Abd: soft, NT, ND Ext: splint on LLE, +2 pdeal pulse in RLE.  RUE in sling and splint Psych: lethargic and unresponsive today  Lab Results:  Recent Labs    03/23/20 0400 03/24/20 0342  WBC 21.8* 17.9*  HGB 9.5* 9.6*  HCT 28.1* 28.5*  PLT 848* 891*   BMET Recent Labs    03/23/20 0400  NA 136  K 3.7  CL 100  CO2 27  GLUCOSE 132*  BUN 21*  CREATININE 0.72  CALCIUM 8.8*   PT/INR No results for input(s): LABPROT, INR in the last 72 hours. CMP     Component Value Date/Time   NA 136 03/23/2020 0400   K 3.7 03/23/2020 0400   CL 100 03/23/2020 0400   CO2 27 03/23/2020 0400   GLUCOSE 132 (H) 03/23/2020 0400   BUN 21 (H) 03/23/2020 0400   CREATININE 0.72 03/23/2020 0400   CALCIUM 8.8 (L) 03/23/2020 0400   PROT 6.0 (L) 03/12/2020 0126   ALBUMIN 3.6 03/12/2020 0126   AST 228 (H) 03/12/2020  0126   ALT 96 (H) 03/12/2020 0126   ALKPHOS 51 03/12/2020 0126   BILITOT 0.6 03/12/2020 0126   GFRNONAA >60 03/23/2020 0400   GFRAA >60 03/23/2020 0400   Lipase  No results found for: LIPASE     Studies/Results: DG CHEST PORT 1 VIEW  Result Date: 03/23/2020 CLINICAL DATA:  Productive cough. Coronavirus infection. Leukocytosis. EXAM: PORTABLE CHEST 1 VIEW COMPARISON:  03/21/2020 FINDINGS: Cardiomegaly as seen previously. Left subclavian central line tip in the SVC at the azygos level. Patchy atelectasis in the mid and lower lungs is improved compared to the study of 2 days ago. No worsening or new finding. IMPRESSION: Improved patchy infiltrate/volume loss in the mid and lower lungs since 2 days ago. Electronically Signed   By: Paulina Fusi M.D.   On: 03/23/2020 09:23    Anti-infectives: Anti-infectives (From admission, onward)   Start     Dose/Rate Route Frequency Ordered Stop   03/16/20 2049  vancomycin (VANCOCIN) powder  Status:  Discontinued          As needed 03/16/20 2049 03/16/20 2357   03/16/20 0600  ceFAZolin (ANCEF) IVPB 2g/100 mL premix  Status:  Discontinued        2 g 200 mL/hr over 30 Minutes Intravenous On call  to O.R. 03/15/20 1920 03/17/20 0020   03/12/20 1215  ceFAZolin (ANCEF) IVPB 2g/100 mL premix        2 g 200 mL/hr over 30 Minutes Intravenous On call to O.R. 03/12/20 1026 03/12/20 1833   03/11/20 2345  Ampicillin-Sulbactam (UNASYN) 3 g in sodium chloride 0.9 % 100 mL IVPB        3 g 200 mL/hr over 30 Minutes Intravenous Every 6 hours 03/11/20 2344 03/18/20 2359       Assessment/Plan PHBC  TBI/F ICC/SDH/SAH/pneumocephalus- per Dr. Maisie Fus, completed Unasyn, Keppra, F/U CT H 7/15 expected enlargement ICCs, F/U CT H 7/17 no signif change, exam improving although today he doesn't do anything for me, TBI team therapies R orbit FX/R maxillary sinus FX/skull base FXs- per Dr. Uvaldo Rising R humeral head FX dislocation/R clavicle FX/R coracoid FX- relocated in ED  by Dr. August Saucer, reconstruction by Dr. August Saucer 7/20 L distal femur FX- S/P ORIF by Dr. Carola Frost 7/15 L bimalleolar ankle FX- S/P ORIF by Dr. Carola Frost 7/15 Pelvic ring FX- S/P B SI pinning by Dr. Carola Frost 7/15 L glenoid and scapula FXs- per Dr. Carola Frost R radial shaft FX- S/P ORIF by Dr. Carola Frost 7/15 L rib FX 2-4 ABL anemia - transfuse 1 u PRBC 7/17-hgb stable Acute hypoxic ventilator dependent respiratory failure- tolerated extubation 7/23.  CXR improved COVID +- not notablysymptomatic, day 13, will as RN to ask IP as he can likely have precautions DC tomorrow on day 14. FEN- cont TF after Cortrak replaced today, SLP eval, DC foley VTE- SCDs, LMWH ID- WBC trending down.  Dc CVC today Dispo- 5W, PT/OT/ST, anticipate CIR.  Will hopefully come off precautions after 7/28.   LOS: 13 days    [name] , [facility] Surgery 03/24/2020, 9:28 AM Please see Amion for pager number during day hours 7:00am-4:30pm or 7:00am -11:30am on weekends

## 2020-03-24 NOTE — Progress Notes (Signed)
PRN Dilaudid given for patient being restless in bed and moaning. Abrasions scattered on extremities and trunk were cleansed as well as dressings changed. The bathing and dressing changes caused elevated heart rate in low 100's. Once finished with bath pt returned to baseline with a green mews score.

## 2020-03-24 NOTE — Progress Notes (Signed)
Joni Reining from Infection Prevention stated that patient doesn't need to be in isolation anymore since he's asymptomatic.  Patient was only required to be on isolation for 10 days since he didn't have any symptoms.
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
## 2020-03-24 NOTE — Progress Notes (Signed)
Patient's restraints untied to perform peri care. Patient pulled on his cortrack while I had my back turned. Trauma MD notified.  No new orders given.

## 2020-03-25 LAB — CBC
HCT: 33.2 % — ABNORMAL LOW (ref 39.0–52.0)
Hemoglobin: 11.2 g/dL — ABNORMAL LOW (ref 13.0–17.0)
MCH: 29.5 pg (ref 26.0–34.0)
MCHC: 33.3 g/dL (ref 30.0–36.0)
MCV: 88.8 fL (ref 80.0–100.0)
Platelets: 1046 K/uL (ref 150–400)
RBC: 3.74 MIL/uL — ABNORMAL LOW (ref 4.22–5.81)
RDW: 14.1 % (ref 11.5–15.5)
WBC: 15.4 K/uL — ABNORMAL HIGH (ref 4.0–10.5)
nRBC: 0 % (ref 0.0–0.2)

## 2020-03-25 LAB — GLUCOSE, CAPILLARY
Glucose-Capillary: 114 mg/dL — ABNORMAL HIGH (ref 70–99)
Glucose-Capillary: 128 mg/dL — ABNORMAL HIGH (ref 70–99)
Glucose-Capillary: 132 mg/dL — ABNORMAL HIGH (ref 70–99)
Glucose-Capillary: 138 mg/dL — ABNORMAL HIGH (ref 70–99)
Glucose-Capillary: 125 mg/dL — ABNORMAL HIGH (ref 70–99)
Glucose-Capillary: 123 mg/dL — ABNORMAL HIGH (ref 70–99)

## 2020-03-25 MED ORDER — ENSURE ENLIVE PO LIQD
237.0000 mL | Freq: Three times a day (TID) | ORAL | Status: DC
  Administered 2020-03-25 – 2020-05-10 (×87): 237 mL via ORAL

## 2020-03-25 MED ORDER — PROSOURCE PLUS PO LIQD
30.0000 mL | Freq: Two times a day (BID) | ORAL | Status: DC
  Administered 2020-03-25 – 2020-03-27 (×5): 30 mL via ORAL
  Filled 2020-03-25 (×6): qty 30

## 2020-03-25 NOTE — Progress Notes (Signed)
   03/25/20 1025  Assess: MEWS Score  Temp 98.7 F (37.1 C)  BP 119/75  ECG Heart Rate (!) 128  Resp 19  SpO2 97 %  O2 Device Room Air  Assess: MEWS Score  MEWS Temp 0  MEWS Systolic 0  MEWS Pulse 2  MEWS RR 0  MEWS LOC 0  MEWS Score 2  MEWS Score Color Yellow  Assess: if the MEWS score is Yellow or Red  Were vital signs taken at a resting state? Yes  Focused Assessment No change from prior assessment  Early Detection of Sepsis Score *See Row Information* Low  MEWS guidelines implemented *See Row Information* Yes  Take Vital Signs  Increase Vital Sign Frequency  Yellow: Q 2hr X 2 then Q 4hr X 2, if remains yellow, continue Q 4hrs  Escalate  MEWS: Escalate Yellow: discuss with charge nurse/RN and consider discussing with provider and RRT  Notify: Charge Nurse/RN  Name of Charge Nurse/RN Notified Pricilla Larsson RN   Date Charge Nurse/RN Notified 03/25/20  Time Charge Nurse/RN Notified 1027

## 2020-03-25 NOTE — Progress Notes (Signed)
Patient arrived to 4N08 in NAD, VS stable. Patient oriented to room.
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
## 2020-03-25 NOTE — Progress Notes (Signed)
Per SLP recommendations, morning medications crushed and administered in applesauce. The syrups were administered via straw and patient swallowed adequately. Patient was able to follow verbal commands to follow and no s/s of aspiration was noted. Will continue to monitor.

## 2020-03-25 NOTE — Progress Notes (Signed)
Cortrak Tube Team Note:  Consult received to replace a Cortrak feeding tube.   Cortrak pulled out overnight. Cortrak placed on 7/26. Pt had another Cortrak placed on 7/23 which was also pulled out. This Cortrak insertion would be pt's 3rd Cortrak tube in 5 days.   Diet advanced today to [facility] today. Cortrak RD was notified that the plan is to hold on replacement of Cortrak tube for now. Plan to assess po intake now that diet advanced and further plan to follow.   Cortrak order discontinued.   Romelle Starcher MS, RDN, LDN, CNSC Registered Dietitian III Clinical Nutrition RD Pager and On-Call Pager Number Located in [place]
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
## 2020-03-25 NOTE — Progress Notes (Addendum)
CRITICAL VALUE ALERT  Critical Value:  Platelets 1,046  Date & Time Notied:  7/28 0739  Provider Notified: Barnetta Chapel paged; no new orders

## 2020-03-25 NOTE — Progress Notes (Signed)
   03/25/20 1020  Vitals  BP 119/75  MAP (mmHg) 89  ECG Heart Rate (!) 139  Resp 19  MEWS Score  MEWS Temp 0  MEWS Systolic 0  MEWS Pulse 3  MEWS RR 0  MEWS LOC 0  MEWS Score 3  MEWS Score Color Yellow    Received call from CCMD stating pt's HR sustaining in 130's-140's. Upon assessment, patient restless and moaning. PRN Dilaudid and Metoprolol administered. Will continue to monitor.  Lyndal Pulley, RN 03/25/2020 10:26 AM

## 2020-03-26 DIAGNOSIS — L8990 Pressure ulcer of unspecified site, unspecified stage: Secondary | ICD-10-CM | POA: Insufficient documentation

## 2020-03-26 LAB — GLUCOSE, CAPILLARY
Glucose-Capillary: 118 mg/dL — ABNORMAL HIGH (ref 70–99)
Glucose-Capillary: 118 mg/dL — ABNORMAL HIGH (ref 70–99)
Glucose-Capillary: 123 mg/dL — ABNORMAL HIGH (ref 70–99)
Glucose-Capillary: 124 mg/dL — ABNORMAL HIGH (ref 70–99)
Glucose-Capillary: 143 mg/dL — ABNORMAL HIGH (ref 70–99)
Glucose-Capillary: 149 mg/dL — ABNORMAL HIGH (ref 70–99)

## 2020-03-26 MED ORDER — BETHANECHOL CHLORIDE 25 MG PO TABS
25.0000 mg | ORAL_TABLET | Freq: Four times a day (QID) | ORAL | Status: DC
  Administered 2020-03-27 – 2020-04-06 (×36): 25 mg via ORAL
  Filled 2020-03-26 (×36): qty 1

## 2020-03-26 MED ORDER — PANTOPRAZOLE SODIUM 40 MG PO PACK
40.0000 mg | PACK | Freq: Every day | ORAL | Status: DC
  Administered 2020-03-27 – 2020-03-31 (×5): 40 mg via ORAL
  Filled 2020-03-26 (×6): qty 20

## 2020-03-26 MED ORDER — POLYETHYLENE GLYCOL 3350 17 G PO PACK
17.0000 g | PACK | Freq: Every day | ORAL | Status: DC
  Administered 2020-03-27 – 2020-04-28 (×20): 17 g via ORAL
  Filled 2020-03-26 (×26): qty 1

## 2020-03-26 MED ORDER — QUETIAPINE FUMARATE 100 MG PO TABS
100.0000 mg | ORAL_TABLET | Freq: Two times a day (BID) | ORAL | Status: DC
  Administered 2020-03-27 – 2020-04-08 (×26): 100 mg via ORAL
  Filled 2020-03-26 (×26): qty 1

## 2020-03-26 MED ORDER — OXYCODONE HCL 5 MG/5ML PO SOLN
5.0000 mg | ORAL | Status: DC | PRN
  Administered 2020-03-27 – 2020-03-29 (×4): 10 mg via ORAL
  Filled 2020-03-26 (×6): qty 10

## 2020-03-26 MED ORDER — DOCUSATE SODIUM 50 MG/5ML PO LIQD
100.0000 mg | Freq: Two times a day (BID) | ORAL | Status: DC
  Administered 2020-03-27 – 2020-03-29 (×7): 100 mg via ORAL
  Filled 2020-03-26 (×7): qty 10

## 2020-03-26 MED ORDER — CLONAZEPAM 1 MG PO TABS
1.0000 mg | ORAL_TABLET | Freq: Two times a day (BID) | ORAL | Status: DC
  Administered 2020-03-27 – 2020-04-06 (×22): 1 mg via ORAL
  Filled 2020-03-26 (×22): qty 1

## 2020-03-26 NOTE — Progress Notes (Signed)
Patient mother Caedon Bond [phone] called to check on patient. She would like a call and update from [facility] team.
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
## 2020-03-26 NOTE — Progress Notes (Signed)
Inpatient Rehabilitation Admissions Coordinator  I met patient at bedside for assessment and then contacted his brother, Collins Scotland by phone. I discussed goals and expectations of a possible CIR admit. Collins Scotland states he no longer has space available in his home, but he will contact his brother, Rip Harbour to clarify if he can offer lodging and 24/7 supervision. Family have been estranged since the death of their Mother a few years ago. Rehab venue will be dependent on his progress with therapy as well as caregiver support available from family.  Danne Baxter, RN, MSN Rehab Admissions Coordinator [phone] 03/26/2020 1:46 PM

## 2020-03-27 LAB — GLUCOSE, CAPILLARY
Glucose-Capillary: 112 mg/dL — ABNORMAL HIGH (ref 70–99)
Glucose-Capillary: 107 mg/dL — ABNORMAL HIGH (ref 70–99)
Glucose-Capillary: 115 mg/dL — ABNORMAL HIGH (ref 70–99)
Glucose-Capillary: 143 mg/dL — ABNORMAL HIGH (ref 70–99)
Glucose-Capillary: 144 mg/dL — ABNORMAL HIGH (ref 70–99)

## 2020-03-27 LAB — TRIGLYCERIDES: Triglycerides: 54 mg/dL (ref <150)

## 2020-03-27 LAB — PATHOLOGIST SMEAR REVIEW

## 2020-03-27 MED ORDER — PROSOURCE PLUS PO LIQD
30.0000 mL | Freq: Three times a day (TID) | ORAL | Status: DC
  Administered 2020-03-27 – 2020-05-11 (×73): 30 mL via ORAL
  Filled 2020-03-27 (×104): qty 30

## 2020-03-27 NOTE — Progress Notes (Addendum)
SLP Cancellation Note  Patient Details Name: Gareld Obrecht MRN: [id number] DOB: 02/26/1981   Cancelled treatment:        Attempted to see to see pt for ongoing swallowing and cognitive linguistic treatment.  Pt was receiving care from RN and Ortho tech at time of attempt. Pt has also received sedative medications this morning. SLP will reattempt as schedule permits.   Kerrie Pleasure, MA, CCC-SLP Acute Rehabilitation Services Office: [phone]  03/27/2020, 11:21 AM
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
## 2020-03-27 NOTE — Progress Notes (Signed)
Orthopedic Tech Progress Note Patient Details:  Anthony Macias [birth year] [id number]  Ortho Devices Type of Ortho Device: CAM walker, Velcro wrist forearm splint Ortho Device/Splint Location: LUE,RUE Ortho Device/Splint Interventions: Ordered, Application, Adjustment   Post Interventions Patient Tolerated: Well Instructions Provided: Care of device   Donald Pore 03/27/2020, 2:42 PM
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
## 2020-03-27 NOTE — Plan of Care (Signed)
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
## 2020-03-28 LAB — GLUCOSE, CAPILLARY
Glucose-Capillary: 108 mg/dL — ABNORMAL HIGH (ref 70–99)
Glucose-Capillary: 112 mg/dL — ABNORMAL HIGH (ref 70–99)
Glucose-Capillary: 123 mg/dL — ABNORMAL HIGH (ref 70–99)
Glucose-Capillary: 123 mg/dL — ABNORMAL HIGH (ref 70–99)
Glucose-Capillary: 152 mg/dL — ABNORMAL HIGH (ref 70–99)
Glucose-Capillary: 95 mg/dL (ref 70–99)

## 2020-03-28 NOTE — Progress Notes (Signed)
Patient extubated Does not really follow commands Right shoulder is located Plan for mobilization of shoulder once patient is better able to follow commands
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
## 2020-03-29 LAB — CBC
HCT: 31.5 % — ABNORMAL LOW (ref 39.0–52.0)
Hemoglobin: 10.6 g/dL — ABNORMAL LOW (ref 13.0–17.0)
MCH: 30 pg (ref 26.0–34.0)
MCHC: 33.7 g/dL (ref 30.0–36.0)
MCV: 89.2 fL (ref 80.0–100.0)
Platelets: 884 K/uL — ABNORMAL HIGH (ref 150–400)
RBC: 3.53 MIL/uL — ABNORMAL LOW (ref 4.22–5.81)
RDW: 13.8 % (ref 11.5–15.5)
WBC: 10.3 K/uL (ref 4.0–10.5)
nRBC: 0 % (ref 0.0–0.2)

## 2020-03-29 LAB — GLUCOSE, CAPILLARY
Glucose-Capillary: 100 mg/dL — ABNORMAL HIGH (ref 70–99)
Glucose-Capillary: 107 mg/dL — ABNORMAL HIGH (ref 70–99)
Glucose-Capillary: 109 mg/dL — ABNORMAL HIGH (ref 70–99)
Glucose-Capillary: 112 mg/dL — ABNORMAL HIGH (ref 70–99)
Glucose-Capillary: 118 mg/dL — ABNORMAL HIGH (ref 70–99)
Glucose-Capillary: 139 mg/dL — ABNORMAL HIGH (ref 70–99)

## 2020-03-29 LAB — BASIC METABOLIC PANEL WITH GFR
Anion gap: 8 (ref 5–15)
BUN: 14 mg/dL (ref 6–20)
CO2: 26 mmol/L (ref 22–32)
Calcium: 9.3 mg/dL (ref 8.9–10.3)
Chloride: 105 mmol/L (ref 98–111)
Creatinine, Ser: 0.71 mg/dL (ref 0.61–1.24)
GFR calc Af Amer: >60 mL/min (ref >60)
GFR calc non Af Amer: >60 mL/min (ref >60)
Glucose, Bld: 109 mg/dL — ABNORMAL HIGH (ref 70–99)
Potassium: 3.8 mmol/L (ref 3.5–5.1)
Sodium: 139 mmol/L (ref 135–145)

## 2020-03-29 NOTE — Progress Notes (Signed)
13 Days Post-Op  Subjective: Patient is back to being slightly less alert today. Of note, patient should NOT have something calling pretending to be his mother.  His mother has passed away.  ROS: See above, otherwise other systems negative  Objective: Vital signs in last 24 hours: Temp:  [97.6 F (36.4 C)-99.2 F (37.3 C)] 97.6 F (36.4 C) (08/01 0721) Pulse Rate:  [86-104] 104 (08/01 0721) Resp:  [10-17] 10 (08/01 0721) BP: (129-158)/(79-103) 133/103 (08/01 0721) SpO2:  [95 %] 95 % (08/01 0400) Weight:  [73.9 kg] 73.9 kg (08/01 0500) Last BM Date: 04/28/20  Intake/Output from previous day: 07/31 0701 - 08/01 0700 In: 10 [I.V.:10] Out: 200 [Urine:200] Intake/Output this shift: No intake/output data recorded.  PE: General appearance: arouses to voice Head: facial edema improving Resp: clear to auscultation bilaterally Cardio: rrr GI: soft, non-tender; bowel sounds normal; no masses,  no organomegaly Extremities: ortho splints in place Neurologic: Mental status: alert, [name], speech more fluent  Lab Results:  Recent Labs    03/29/20 0410  WBC 10.3  HGB 10.6*  HCT 31.5*  PLT 884*   BMET Recent Labs    03/29/20 0410  NA 139  K 3.8  CL 105  CO2 26  GLUCOSE 109*  BUN 14  CREATININE 0.71  CALCIUM 9.3   PT/INR No results for input(s): LABPROT, INR in the last 72 hours. CMP     Component Value Date/Time   NA 139 03/29/2020 0410   K 3.8 03/29/2020 0410   CL 105 03/29/2020 0410   CO2 26 03/29/2020 0410   GLUCOSE 109 (H) 03/29/2020 0410   BUN 14 03/29/2020 0410   CREATININE 0.71 03/29/2020 0410   CALCIUM 9.3 03/29/2020 0410   PROT 6.0 (L) 03/12/2020 0126   ALBUMIN 3.6 03/12/2020 0126   AST 228 (H) 03/12/2020 0126   ALT 96 (H) 03/12/2020 0126   ALKPHOS 51 03/12/2020 0126   BILITOT 0.6 03/12/2020 0126   GFRNONAA >60 03/29/2020 0410   GFRAA >60 03/29/2020 0410   Lipase  No results found for: LIPASE     Studies/Results: No results  found.  Anti-infectives: Anti-infectives (From admission, onward)   Start     Dose/Rate Route Frequency Ordered Stop   03/16/20 2049  vancomycin (VANCOCIN) powder  Status:  Discontinued          As needed 03/16/20 2049 03/16/20 2357   03/16/20 0600  ceFAZolin (ANCEF) IVPB 2g/100 mL premix  Status:  Discontinued        2 g 200 mL/hr over 30 Minutes Intravenous On call to O.R. 03/15/20 1920 03/17/20 0020   03/12/20 1215  ceFAZolin (ANCEF) IVPB 2g/100 mL premix        2 g 200 mL/hr over 30 Minutes Intravenous On call to O.R. 03/12/20 1026 03/12/20 1833   03/11/20 2345  Ampicillin-Sulbactam (UNASYN) 3 g in sodium chloride 0.9 % 100 mL IVPB        3 g 200 mL/hr over 30 Minutes Intravenous Every 6 hours 03/11/20 2344 03/18/20 2359       Assessment/Plan PHBC  TBI/F ICC/SDH/SAH/pneumocephalus- per Dr. Maisie Fus, completed Unasyn, Keppra, F/U CT H 7/15 expected enlargement ICCs, F/U CT H 7/17 no signif change, exam stable, therapies R orbit FX/R maxillary sinus FX/skull base FXs- per Dr. Uvaldo Rising R humeral head FX dislocation/R clavicle FX/R coracoid FX- relocated in ED by Dr. August Saucer, reconstruction by Dr. August Saucer 7/20 L distal femur FX- S/P ORIF by Dr. Carola Frost 7/15 L bimalleolar ankle  FX- S/P ORIF by Dr. Carola Frost 7/15 Pelvic ring FX- S/P B SI pinning by Dr. Carola Frost 7/15 L glenoid and scapula FXs- per Dr. Carola Frost R radial shaft FX- S/P ORIF by Dr. Carola Frost 7/15 L rib FX 2-4 ABL anemia - transfuse 1 u PRBC 7/17-hgb stable Acute hypoxic ventilator dependent respiratory failure- tolerated extubation 7/23. CXR improved COVID +- not notablysymptomatic, take off precautions per IP.  TX to 4NP FEN- cont TFafter Cortrak replaced today, SLP eval VTE- SCDs, LMWH ID -  Afeb, off ABX  Dispo-4NP, anticipate CIR, if able to find support from family or friends   LOS: 18 days   Marin Olp, M.D. [facility] Use [email or web address] to contact on call provider

## 2020-03-30 LAB — BLOOD GAS, ARTERIAL
Acid-Base Excess: 0.9 mmol/L (ref 0.0–2.0)
Bicarbonate: 24.9 mmol/L (ref 20.0–28.0)
Drawn by: 535271
FIO2: 21
O2 Saturation: 95.1 %
Patient temperature: 37
pCO2 arterial: 39 mmHg (ref 32.0–48.0)
pH, Arterial: 7.42 (ref 7.350–7.450)
pO2, Arterial: 77.1 mmHg — ABNORMAL LOW (ref 83.0–108.0)

## 2020-03-30 LAB — GLUCOSE, CAPILLARY
Glucose-Capillary: 100 mg/dL — ABNORMAL HIGH (ref 70–99)
Glucose-Capillary: 115 mg/dL — ABNORMAL HIGH (ref 70–99)
Glucose-Capillary: 117 mg/dL — ABNORMAL HIGH (ref 70–99)
Glucose-Capillary: 123 mg/dL — ABNORMAL HIGH (ref 70–99)
Glucose-Capillary: 90 mg/dL (ref 70–99)

## 2020-03-30 MED ORDER — DOCUSATE SODIUM 100 MG PO CAPS
100.0000 mg | ORAL_CAPSULE | Freq: Two times a day (BID) | ORAL | Status: DC
  Administered 2020-03-30 – 2020-05-10 (×54): 100 mg via ORAL
  Filled 2020-03-30 (×70): qty 1

## 2020-03-30 MED ORDER — ACETAMINOPHEN 325 MG PO TABS
650.0000 mg | ORAL_TABLET | Freq: Four times a day (QID) | ORAL | Status: DC | PRN
  Administered 2020-04-01 – 2020-04-07 (×3): 650 mg via ORAL
  Filled 2020-03-30 (×4): qty 2

## 2020-03-30 MED ORDER — OXYCODONE HCL 5 MG PO TABS
5.0000 mg | ORAL_TABLET | ORAL | Status: DC | PRN
  Administered 2020-04-02 – 2020-04-07 (×6): 10 mg via ORAL
  Filled 2020-03-30 (×7): qty 2
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
## 2020-03-30 NOTE — Progress Notes (Signed)
[facility] Progress Note  14 Days Post-Op  Subjective: CC-  Still confused. Fixated on [place] sandwiches and that is his answer to all orientation questions.  Objective: Vital signs in last 24 hours: Temp:  [98.5 F (36.9 C)-99.5 F (37.5 C)] 98.5 F (36.9 C) (08/02 0726) Pulse Rate:  [91-104] 99 (08/02 0726) Resp:  [13-21] 18 (08/02 0726) BP: (105-130)/(82-94) 121/93 (08/02 0726) SpO2:  [95 %-97 %] 97 % (08/02 0726) Weight:  [73.9 kg] 73.9 kg (08/02 0500) Last BM Date: 03/29/20  Intake/Output from previous day: 08/01 0701 - 08/02 0700 In: 310 [P.O.:300; I.V.:10] Out: -  Intake/Output this shift: No intake/output data recorded.  PE: Gen:  Alert, NAD, pleasant HEENT: right eye abrasion clean Card:  RRR Pulm:  CTAB, no W/R/R, rate and effort normal Abd: Soft, NT/ND, +BS Ext:  calves soft and nontender without edema, splint to right wrist, boot to left foot Psych: Alert, f/c with BUE/BLE Skin: no rashes noted, warm and dry  Lab Results:  Recent Labs    03/29/20 0410  WBC 10.3  HGB 10.6*  HCT 31.5*  PLT 884*   BMET Recent Labs    03/29/20 0410  NA 139  K 3.8  CL 105  CO2 26  GLUCOSE 109*  BUN 14  CREATININE 0.71  CALCIUM 9.3   PT/INR No results for input(s): LABPROT, INR in the last 72 hours. CMP     Component Value Date/Time   NA 139 03/29/2020 0410   K 3.8 03/29/2020 0410   CL 105 03/29/2020 0410   CO2 26 03/29/2020 0410   GLUCOSE 109 (H) 03/29/2020 0410   BUN 14 03/29/2020 0410   CREATININE 0.71 03/29/2020 0410   CALCIUM 9.3 03/29/2020 0410   PROT 6.0 (L) 03/12/2020 0126   ALBUMIN 3.6 03/12/2020 0126   AST 228 (H) 03/12/2020 0126   ALT 96 (H) 03/12/2020 0126   ALKPHOS 51 03/12/2020 0126   BILITOT 0.6 03/12/2020 0126   GFRNONAA >60 03/29/2020 0410   GFRAA >60 03/29/2020 0410   Lipase  No results found for: LIPASE     Studies/Results: No results found.  Anti-infectives: Anti-infectives (From admission, onward)    Start     Dose/Rate Route Frequency Ordered Stop   03/16/20 2049  vancomycin (VANCOCIN) powder  Status:  Discontinued          As needed 03/16/20 2049 03/16/20 2357   03/16/20 0600  ceFAZolin (ANCEF) IVPB 2g/100 mL premix  Status:  Discontinued        2 g 200 mL/hr over 30 Minutes Intravenous On call to O.R. 03/15/20 1920 03/17/20 0020   03/12/20 1215  ceFAZolin (ANCEF) IVPB 2g/100 mL premix        2 g 200 mL/hr over 30 Minutes Intravenous On call to O.R. 03/12/20 1026 03/12/20 1833   03/11/20 2345  Ampicillin-Sulbactam (UNASYN) 3 g in sodium chloride 0.9 % 100 mL IVPB        3 g 200 mL/hr over 30 Minutes Intravenous Every 6 hours 03/11/20 2344 03/18/20 2359       Assessment/Plan PHBC  TBI/F ICC/SDH/SAH/pneumocephalus- per Dr. Maisie Fus, completed Unasyn, Keppra, F/U CT H 7/15 expected enlargement ICCs, F/U CT H 7/17 no signif change, exam stable, therapies R orbit FX/R maxillary sinus FX/skull base FXs- per Dr. Uvaldo Rising R humeral head FX dislocation/R clavicle FX/R coracoid FX- relocated in ED by Dr. August Saucer, reconstruction by Dr. August Saucer 7/20. NWB RUE L distal femur FX- S/P ORIF by Dr. Carola Frost 7/15  L bimalleolar ankle FX- S/P ORIF by Dr. Carola Frost 7/15. CAM walker with getting up. NWB LLE Pelvic ring FX- S/P B SI pinning by Dr. Carola Frost 7/15. WBAT RLE for transfers only L glenoid and scapula FXs- per Dr. Carola Frost R radial shaft FX- S/P ORIF by Dr. Carola Frost 7/15 L rib FX 2-4 - pain control, pulm toilet ABL anemia - transfuse 1 u PRBC 7/17-hgb stable Acute hypoxic ventilator dependent respiratory failure- tolerated extubation 7/23. CXR improved COVID +- not notablysymptomatic, take off precautions per IP. TX to 4NP FEN- FLD VTE- SCDs, LMWH ID -Afeb, off ABX Dispo-4NP,anticipate CIR, if able to find support from family or friends   LOS: 19 days    Franne Forts, [facility] 03/30/2020, 11:04 AM Please see Amion for pager number during day hours  7:00am-4:30pm
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
## 2020-03-30 NOTE — Progress Notes (Signed)
Orthopaedic Trauma Service (OTS)  14 Days Post-Op Procedure(s) (LRB): OPEN REDUCTION INTERNAL FIXATION RIGHT PROXIMAL HUMEROUS FRACTURE AND CLAVICAL FRACTURE WITH BICEPS TENODESIS (Right)  Subjective: Patient reports pain as mild.    Objective: Current Vitals Blood pressure (!) 121/93, pulse 99, temperature 98.5 F (36.9 C), temperature source Oral, resp. rate 18, height 5\' 11"  (1.803 m), weight 73.9 kg, SpO2 97 %. Vital signs in last 24 hours: Temp:  [98.5 F (36.9 C)-99.5 F (37.5 C)] 98.5 F (36.9 C) (08/02 0726) Pulse Rate:  [91-104] 99 (08/02 0726) Resp:  [13-21] 18 (08/02 0726) BP: (105-130)/(82-94) 121/93 (08/02 0726) SpO2:  [95 %-97 %] 97 % (08/02 0726) Weight:  [73.9 kg] 73.9 kg (08/02 0500)  Intake/Output from previous day: 08/01 0701 - 08/02 0700 In: 310 [P.O.:300; I.V.:10] Out: -   LABS Recent Labs    03/29/20 0410  HGB 10.6*   Recent Labs    03/29/20 0410  WBC 10.3  RBC 3.53*  HCT 31.5*  PLT 884*   Recent Labs    03/29/20 0410  NA 139  K 3.8  CL 105  CO2 26  BUN 14  CREATININE 0.71  GLUCOSE 109*  CALCIUM 9.3   No results for input(s): LABPT, INR in the last 72 hours.   Physical Exam RUEX per Dr. 05/29/20 LUE  Splint in place  No change in exam R&LLE  Dressing intact, clean, dry  No change in exam Assessment/Plan: 14 Days Post-Op Procedure(s) (LRB): OPEN REDUCTION INTERNAL FIXATION RIGHT PROXIMAL HUMEROUS FRACTURE AND CLAVICAL FRACTURE WITH BICEPS TENODESIS (Right) 1. PT/OT  2. DVT proph Lovenox 3. F/u 8-14 days  August Saucer, MD [facility], [facility] [phone]

## 2020-03-30 NOTE — Progress Notes (Signed)
Inpatient Rehab Admissions Coordinator:   Spoke with pt's brother, Chase Picket, who states that pt was adopted and his biological mother Lyn Deemer, [phone]), had contacted him regarding pt's hospitalization and dispo.  Chase Picket states that he had provided [place] with password for updates, and that she was able to provide care for patient at d/c from rehab.    I was able to speak with her on the phone.  Unfortunately, she would not be able to provide 24/7 physical care (expected CIR goals of up to min assist with 3-4 week stay based on CLOF) as she works.  Was open to looking at SNF for longer term rehab, and I did briefly discuss the challenges with SNF placement without a payor source.  She has a copy of what sounds like a Medicaid application that was sent to pt back in April.  She gave me permission to pass her info along to financial counseling to complete Medicaid screening.  Pt would certainly be appropriate for CIR if 24/7 assist could be arranged, and Nettie Elm and I agreed to speak again if pt progresses to where he may require less assist after a CIR stay.  Will continue to follow from a distance.   Estill Dooms, PT, DPT Admissions Coordinator [phone] 03/30/20  1:05 PM
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
## 2020-03-31 LAB — GLUCOSE, CAPILLARY
Glucose-Capillary: 118 mg/dL — ABNORMAL HIGH (ref 70–99)
Glucose-Capillary: 118 mg/dL — ABNORMAL HIGH (ref 70–99)
Glucose-Capillary: 102 mg/dL — ABNORMAL HIGH (ref 70–99)
Glucose-Capillary: 102 mg/dL — ABNORMAL HIGH (ref 70–99)
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
## 2020-03-31 NOTE — TOC Progression Note (Signed)
Transition of Care [facility]) - Progression Note    Patient Details  Name: Anthony Macias MRN: [id number] Date of Birth: [birth year]  [facility] CM/SW Contact  Glennon Mac, RN Phone Number: 03/31/2020, 1330 Clinical Narrative:   Spoke with patient's biological mother, Anthony Macias: She is now allowed to visit patient and is allowed to get patient information from staff.  We discussed discharge planning, and she states that she will not be able to provide 24/7 supervision at discharge as will be needed after inpatient rehab.  We discussed the difference between inpatient rehab and skilled nursing facility, and she feels that patient may need SNF.  She states that she has discussed discharge plans with patient's brothers, but they are unable to work out a plan that would provide 24-hour care for him.  Will initiate FL- 2 and fax out to area facilities.  As patient is uninsured and has a TBI, placement will be difficult.  Will likely need letter of guarantee from this facility; will discuss with [facility] director.  CIR admissions coordinator to follow, should family come up with a plan to provide 24-hour care at discharge.    Expected Discharge Plan: Skilled Nursing Facility Barriers to Discharge: Continued Medical Work up, Inadequate or no insurance  Expected Discharge Plan and Services Expected Discharge Plan: Skilled Nursing Facility   Discharge Planning Services: CM Consult   Living arrangements for the past 2 months: Homeless                                       Social Determinants of Health (SDOH) Interventions    Readmission Risk Interventions No flowsheet data found.  Quintella Baton, RN, BSN  Trauma/Neuro ICU Case Manager [phone]

## 2020-04-01 MED ORDER — PANTOPRAZOLE SODIUM 40 MG PO TBEC
40.0000 mg | DELAYED_RELEASE_TABLET | Freq: Every day | ORAL | Status: DC
  Administered 2020-04-03 – 2020-05-10 (×27): 40 mg via ORAL
  Filled 2020-04-01 (×36): qty 1

## 2020-04-01 NOTE — Progress Notes (Signed)
Orthopedic Tech Progress Note Patient Details:  Anthony Macias 05/03/1981 [id number] Velcro wrist splint was thrown away yesterday and RN called requesting a new one for patient Ortho Devices Type of Ortho Device: Velcro wrist forearm splint Ortho Device/Splint Location: RUE Ortho Device/Splint Interventions: Ordered, Application   Post Interventions Patient Tolerated: Well Instructions Provided: Care of device   Donald Pore 04/01/2020, 9:04 AM
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
## 2020-04-01 NOTE — Progress Notes (Signed)
Trauma Service Note  Chief Complaint/Subjective: No complaints, no new pains  Objective: Vital signs in last 24 hours: Temp:  [98.1 F (36.7 C)-99.5 F (37.5 C)] 98.6 F (37 C) (08/04 0409) Pulse Rate:  [25-98] 83 (08/03 2334) Resp:  [17-18] 18 (08/03 2000) BP: (114-126)/(77-92) 126/86 (08/04 0400) SpO2:  [98 %-99 %] 98 % (08/03 1500) Weight:  [65.6 kg] 65.6 kg (08/04 0500) Last BM Date: 03/30/20  Intake/Output from previous day: 08/03 0701 - 08/04 0700 In: 650 [P.O.:650] Out: 1025 [Urine:1025] Intake/Output this shift: No intake/output data recorded.  General: NAD  Lungs: nonlabored  Abd: soft, NT, ND  Extremities: no edema  Neuro: alert, oriented to person and place, unable to specify time or reason  Lab Results: CBC  No results for input(s): WBC, HGB, HCT, PLT in the last 72 hours. BMET No results for input(s): NA, K, CL, CO2, GLUCOSE, BUN, CREATININE, CALCIUM in the last 72 hours. PT/INR No results for input(s): LABPROT, INR in the last 72 hours. ABG Recent Labs    03/30/20 1229  PHART 7.420  HCO3 24.9    Studies/Results: No results found.  Anti-infectives: Anti-infectives (From admission, onward)   Start     Dose/Rate Route Frequency Ordered Stop   03/16/20 2049  vancomycin (VANCOCIN) powder  Status:  Discontinued          As needed 03/16/20 2049 03/16/20 2357   03/16/20 0600  ceFAZolin (ANCEF) IVPB 2g/100 mL premix  Status:  Discontinued        2 g 200 mL/hr over 30 Minutes Intravenous On call to O.R. 03/15/20 1920 03/17/20 0020   03/12/20 1215  ceFAZolin (ANCEF) IVPB 2g/100 mL premix        2 g 200 mL/hr over 30 Minutes Intravenous On call to O.R. 03/12/20 1026 03/12/20 1833   03/11/20 2345  Ampicillin-Sulbactam (UNASYN) 3 g in sodium chloride 0.9 % 100 mL IVPB        3 g 200 mL/hr over 30 Minutes Intravenous Every 6 hours 03/11/20 2344 03/18/20 2359      Medications Scheduled Meds: . (feeding supplement) PROSource Plus  30 mL Oral TID BM   . sodium chloride   Intravenous Once  . sodium chloride   Intravenous Once  . bethanechol  25 mg Oral QID  . chlorhexidine gluconate (MEDLINE KIT)  15 mL Mouth Rinse BID  . clonazePAM  1 mg Oral BID  . docusate sodium  100 mg Oral BID  . enoxaparin (LOVENOX) injection  30 mg Subcutaneous Q12H  . feeding supplement (ENSURE ENLIVE)  237 mL Oral TID BM  . pantoprazole sodium  40 mg Oral Daily  . polyethylene glycol  17 g Oral Daily  . QUEtiapine  100 mg Oral BID  . sodium chloride flush  10-40 mL Intracatheter Q12H   Continuous Infusions: . 0.9 % NaCl with KCl 40 mEq / L Stopped (03/31/20 1755)   PRN Meds:.acetaminophen, HYDROmorphone (DILAUDID) injection, metoCLOPramide **OR** metoCLOPramide (REGLAN) injection, metoprolol tartrate, ondansetron **OR** ondansetron (ZOFRAN) IV, oxyCODONE, sodium chloride flush  Assessment/Plan: s/p Procedure(s): OPEN REDUCTION INTERNAL FIXATION RIGHT PROXIMAL HUMEROUS FRACTURE AND CLAVICAL FRACTURE WITH BICEPS TENODESIS  PHBC  TBI/F ICC/SDH/SAH/pneumocephalus- per Dr. Marcello Moores, completed Unasyn, Keppra, F/U CT H 7/15 expected enlargement ICCs, F/U CT H 7/17 no signif change, exam stable, therapies R orbit FX/R maxillary sinus FX/skull base FXs- per Dr. Leonides Schanz R humeral head FX dislocation/R clavicle FX/R coracoid FX- relocated in ED by Dr. Marlou Sa, reconstruction by Dr. Marlou Sa 7/20. NWB RUE  L distal femur FX- S/P ORIF by Dr. Marcelino Scot 7/15 L bimalleolar ankle FX- S/P ORIF by Dr. Marcelino Scot 7/15. CAM walker with getting up. NWB LLE Pelvic ring FX- S/P B SI pinning by Dr. Marcelino Scot 7/15. WBAT RLE for transfers only L glenoid and scapula FXs- per Dr. Marcelino Scot R radial shaft FX- S/P ORIF by Dr. Marcelino Scot 7/15 L rib FX 2-4- pain control, pulm toilet ABL anemia - transfuse 1 u PRBC 7/17-hgb stable Acute hypoxic ventilator dependent respiratory failure- tolerated extubation 7/23. CXR improved COVID +- not notablysymptomatic, take off precautions per IP. TX to 4NP  8/3 FEN- regular diet - SLP following VTE- SCDs, LMWH ID -Afeb, off ABX Dispo-4NP,unable to have 24/7 support after CIR so likely will be LOG SNF at this time.  LOS: 21 days   [place] Trauma Surgeon [phone] Surgery 04/01/2020
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
## 2020-04-02 NOTE — Progress Notes (Signed)
Patient ID: Anthony Macias, male   DOB: [birth year], 39 y.o.   MRN: [id number]    17 Days Post-Op  Subjective: Not oriented today.  Took c-collar off.  Low grade temp overnight noted of 100.4  ROS: See above, otherwise other systems negative  Objective: Vital signs in last 24 hours: Temp:  [98.3 F (36.8 C)-100.4 F (38 C)] 99.8 F (37.7 C) (08/05 0755) Pulse Rate:  [100-124] 109 (08/05 0755) Resp:  [13-20] 14 (08/05 0755) BP: (116-131)/(74-92) 116/74 (08/05 0755) SpO2:  [96 %-100 %] 99 % (08/05 0755) Weight:  [65.3 kg] 65.3 kg (08/05 0500) Last BM Date: 04/01/20  Intake/Output from previous day: 08/04 0701 - 08/05 0700 In: 590 [P.O.:580; I.V.:10] Out: 100 [Urine:100] Intake/Output this shift: No intake/output data recorded.  PE: Gen: Alert, NAD, pleasant HEENT:right eye abrasion clean Card: RRR Pulm: CTAB, no W/R/R, rate and effort normal Abd: Soft, NT/ND, +BS Ext: sling to RUE, splint to right wrist, boot to left foot Psych: somewhat alert, f/c with BUE/BLE Skin: no rashes noted, warm and dry  Lab Results:  No results for input(s): WBC, HGB, HCT, PLT in the last 72 hours. BMET No results for input(s): NA, K, CL, CO2, GLUCOSE, BUN, CREATININE, CALCIUM in the last 72 hours. PT/INR No results for input(s): LABPROT, INR in the last 72 hours. CMP     Component Value Date/Time   NA 139 03/29/2020 0410   K 3.8 03/29/2020 0410   CL 105 03/29/2020 0410   CO2 26 03/29/2020 0410   GLUCOSE 109 (H) 03/29/2020 0410   BUN 14 03/29/2020 0410   CREATININE 0.71 03/29/2020 0410   CALCIUM 9.3 03/29/2020 0410   PROT 6.0 (L) 03/12/2020 0126   ALBUMIN 3.6 03/12/2020 0126   AST 228 (H) 03/12/2020 0126   ALT 96 (H) 03/12/2020 0126   ALKPHOS 51 03/12/2020 0126   BILITOT 0.6 03/12/2020 0126   GFRNONAA >60 03/29/2020 0410   GFRAA >60 03/29/2020 0410   Lipase  No results found for: LIPASE     Studies/Results: No results found.  Anti-infectives: Anti-infectives  (From admission, onward)   Start     Dose/Rate Route Frequency Ordered Stop   03/16/20 2049  vancomycin (VANCOCIN) powder  Status:  Discontinued          As needed 03/16/20 2049 03/16/20 2357   03/16/20 0600  ceFAZolin (ANCEF) IVPB 2g/100 mL premix  Status:  Discontinued        2 g 200 mL/hr over 30 Minutes Intravenous On call to O.R. 03/15/20 1920 03/17/20 0020   03/12/20 1215  ceFAZolin (ANCEF) IVPB 2g/100 mL premix        2 g 200 mL/hr over 30 Minutes Intravenous On call to O.R. 03/12/20 1026 03/12/20 1833   03/11/20 2345  Ampicillin-Sulbactam (UNASYN) 3 g in sodium chloride 0.9 % 100 mL IVPB        3 g 200 mL/hr over 30 Minutes Intravenous Every 6 hours 03/11/20 2344 03/18/20 2359       Assessment/Plan PHBC  TBI/F ICC/SDH/SAH/pneumocephalus- per Dr. Maisie Fus, completed Unasyn, Keppra, F/U CT H 7/15 expected enlargement ICCs, F/U CT H 7/17 no signif change, exam stable, therapies R orbit FX/R maxillary sinus FX/skull base FXs- per Dr. Uvaldo Rising R humeral head FX dislocation/R clavicle FX/R coracoid FX- relocated in ED by Dr. August Saucer, reconstruction by Dr. August Saucer 7/20. NWB RUE L distal femur FX- S/P ORIF by Dr. Carola Frost 7/15 L bimalleolar ankle FX- S/P ORIF by Dr. Carola Frost 7/15. CAM walker  with getting up. NWB LLE Pelvic ring FX- S/P B SI pinning by Dr. Carola Frost 7/15. WBAT RLE for transfers only L glenoid and scapula FXs- per Dr. Carola Frost R radial shaft FX- S/P ORIF by Dr. Carola Frost 7/15 L rib FX 2-4- pain control, pulm toilet ABL anemia - transfuse 1 u PRBC 7/17-hgb stable Acute hypoxic ventilator dependent respiratory failure- tolerated extubation 7/23. CXR improved COVID +- not notablysymptomatic, take off precautions per IP. TX to 4NP FEN- regular diet - SLP following VTE- SCDs, LMWH ID -Afeb, off ABX Dispo-4NP,unable to have 24/7 support after CIR so will be LOG SNF at this time.   LOS: 22 days    Anthony Macias , [facility] Surgery 04/02/2020, 10:10  AM Please see Amion for pager number during day hours 7:00am-4:30pm or 7:00am -11:30am on weekends

## 2020-04-02 NOTE — Progress Notes (Signed)
Initial Nutrition Assessment  DOCUMENTATION CODES:   Not applicable  INTERVENTION:  ContinueEnsure Enlive poTID, each supplement provides 350 kcal and 20 grams of protein  Continue30 ml Prosource pluspo TID, each supplement provides 100 kcal and 15 grams of protein.  Encourage adequate PO intake.   NUTRITION DIAGNOSIS:   Increased nutrient needs related to wound healing as evidenced by estimated needs; ongoing  GOAL:   Patient will meet greater than or equal to 90% of their needs; progressing  MONITOR:   PO intake, Supplement acceptance, Skin, Weight trends, Labs, I & O's  REASON FOR ASSESSMENT:   Consult, Ventilator Enteral/tube feeding initiation and management  ASSESSMENT:   [facility] admitted with TBI, SDH, SAH, pneumocephalus, ICC, R orbit fx, R maxillary sinus fx, skull base fxs, R humeral head fx dislocation, R clavicle fx, R coracoid fx, L distal femur fx s/p ORIF 7/15, L ankle fx s/p ORIF 7/15, pelvic ring fx s/p pinning 7/15, L scapula fxs, R radial shaft fx s/p ORIF 7/15, and L rib fx 2-4. Pt covid + on admission.  7/23- extubated 7/26-pulled out cortrak overnight,cortrak tube replaced- tip of tube confirmed in stomach 7/27 - pulled out cortrak NGT 7/28 - diet advanced  Diet advanced to a regular diet with thin liquids. Meal completion has been varied from 0-100%. Pt currently has Ensure and Prosource plus ordered and has been consuming them. RD to continue with current orders to aid in caloric and protein needs. Pt encouraged to eat his food at meals and to drink his supplements.   Labs and medications reviewed.   Diet Order:   Diet Order            Diet regular Room service appropriate? Yes; Fluid consistency: Thin  Diet effective now                 EDUCATION NEEDS:   No education needs have been identified at this time  Skin:  Skin Assessment: Reviewed RN Assessment Skin Integrity Issues:: Incisions, Other (Comment) Incisions: R arm, L leg,  L thigh, L hip Other: R eye laceration  Last BM:  8/4  Height:   Ht Readings from Last 1 Encounters:  03/22/20 5\' 11"  (1.803 m)    Weight:   Wt Readings from Last 1 Encounters:  04/02/20 65.3 kg    Ideal Body Weight:  80.9 kg  BMI:  Body mass index is 20.08 kg/m.  Estimated Nutritional Needs:   Kcal:  2200-2400  Protein:  135-160 grams  Fluid:  2 L/day  06/02/20, MS, RD, LDN RD pager number/after hours weekend pager number on Amion.
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
## 2020-04-03 NOTE — Progress Notes (Signed)
Inpatient Rehab Admissions Coordinator:   Discussed in trauma rounds.  Still no available 24/7 support.  Will need SNF.  CIR to sign off at this time.   Estill Dooms, PT, DPT Admissions Coordinator [phone] 04/03/20  11:35 AM

## 2020-04-04 NOTE — Progress Notes (Signed)
Patient ID: Anthony Macias, male   DOB: 04/29/1981, 39 y.o.   MRN: [id number] 19 Days Post-Op   Subjective: No c/o  ROS negative except as listed above. Objective: Vital signs in last 24 hours: Temp:  [97.6 F (36.4 C)-98.8 F (37.1 C)] 97.9 F (36.6 C) (08/07 0329) Pulse Rate:  [102-114] 102 (08/07 0329) Resp:  [15-20] 15 (08/07 0329) BP: (111-123)/(65-84) 114/84 (08/07 0329) SpO2:  [96 %-98 %] 97 % (08/07 0329) Last BM Date: 04/02/20  Intake/Output from previous day: 08/06 0701 - 08/07 0700 In: 1110 [P.O.:1110] Out: 1150 [Urine:1150] Intake/Output this shift: No intake/output data recorded.  General appearance: cooperative Resp: clear to auscultation bilaterally Cardio: regular rate and rhythm GI: soft, non-tender; bowel sounds normal; no masses,  no organomegaly Extremities: ortho splints/dressings Neurologic: Mental status: not oriented to date/year, does [name]. Says some coherent sentences.  Lab Results: CBC  No results for input(s): WBC, HGB, HCT, PLT in the last 72 hours. BMET No results for input(s): NA, K, CL, CO2, GLUCOSE, BUN, CREATININE, CALCIUM in the last 72 hours. PT/INR No results for input(s): LABPROT, INR in the last 72 hours. ABG No results for input(s): PHART, HCO3 in the last 72 hours.  Invalid input(s): PCO2, PO2  Studies/Results: No results found.  Anti-infectives: Anti-infectives (From admission, onward)   Start     Dose/Rate Route Frequency Ordered Stop   03/16/20 2049  vancomycin (VANCOCIN) powder  Status:  Discontinued          As needed 03/16/20 2049 03/16/20 2357   03/16/20 0600  ceFAZolin (ANCEF) IVPB 2g/100 mL premix  Status:  Discontinued        2 g 200 mL/hr over 30 Minutes Intravenous On call to O.R. 03/15/20 1920 03/17/20 0020   03/12/20 1215  ceFAZolin (ANCEF) IVPB 2g/100 mL premix        2 g 200 mL/hr over 30 Minutes Intravenous On call to O.R. 03/12/20 1026 03/12/20 1833   03/11/20 2345  Ampicillin-Sulbactam (UNASYN) 3  g in sodium chloride 0.9 % 100 mL IVPB        3 g 200 mL/hr over 30 Minutes Intravenous Every 6 hours 03/11/20 2344 03/18/20 2359      Assessment/Plan: PHBC  TBI/F ICC/SDH/SAH/pneumocephalus- per Dr. Maisie Fus, completed Unasyn, Keppra, F/U CT H 7/15 expected enlargement ICCs, F/U CT H 7/17 no signif change, exam stable, therapies R orbit FX/R maxillary sinus FX/skull base FXs- per Dr. Uvaldo Rising R humeral head FX dislocation/R clavicle FX/R coracoid FX- relocated in ED by Dr. August Saucer, reconstruction by Dr. August Saucer 7/20. NWB RUE L distal femur FX- S/P ORIF by Dr. Carola Frost 7/15 L bimalleolar ankle FX- S/P ORIF by Dr. Carola Frost 7/15. CAM walker with getting up. NWB LLE Pelvic ring FX- S/P B SI pinning by Dr. Carola Frost 7/15. WBAT RLE for transfers only L glenoid and scapula FXs- per Dr. Carola Frost R radial shaft FX- S/P ORIF by Dr. Carola Frost 7/15 L rib FX 2-4- pain control, pulm toilet ABL anemia  COVID +- not symptomatic, off precautions per IP FEN- regular diet VTE- SCDs, LMWH ID -Afeb, off ABX Dispo-4NP,TBI team theraoies, unable to have 24/7 support after CIR so will be LOG SNF    LOS: 24 days    Anthony Macias. Anthony Campanile, MD, FACS General, Bariatric, & Minimally Invasive Surgery [facility] Surgery, [place]  04/04/2020

## 2020-04-05 ENCOUNTER — Other Ambulatory Visit: Payer: Self-pay

## 2020-04-05 ENCOUNTER — Encounter (HOSPITAL_COMMUNITY): Payer: Self-pay

## 2020-04-05 NOTE — Progress Notes (Signed)
Tele ringing out with ST-V. ECG obtained. Reading states "sinus tachycardia, otherwise normal" Rate 104. Patient denies crushing chest pain/pressure at this moment. MD notified.

## 2020-04-05 NOTE — Progress Notes (Signed)
Patient ID: Anthony Macias, male   DOB: 1980-10-16, 39 y.o.   MRN: [id number] 20 Days Post-Op   Subjective: No c/o other than feeling very stiff.    Objective: Vital signs in last 24 hours: Temp:  [98.5 F (36.9 C)-99.8 F (37.7 C)] 98.5 F (36.9 C) (08/08 0730) Pulse Rate:  [88-109] 88 (08/08 0730) Resp:  [13-15] 13 (08/08 0730) BP: (107-123)/(80-93) 110/93 (08/08 0730) SpO2:  [99 %-100 %] 100 % (08/08 0730) Weight:  [65.4 kg] 65.4 kg (08/08 0432) Last BM Date: 04/04/20  Intake/Output from previous day: 08/07 0701 - 08/08 0700 In: -  Out: 1150 [Urine:1150] Intake/Output this shift: No intake/output data recorded.  General appearance: cooperative Still with right eyelid droop and healing abrasions.   Resp: clear to auscultation bilaterally Cardio: tachycardic GI: soft, non-tender; bowel sounds normal; no masses,  no organomegaly Extremities: ortho splints/dressings.  Moves all extremities and sensation intact.   Neurologic: actually able to hold a conversation, just slow to respond and slow formation of sentences, but makes sense and answers appropriately.    Makes attempt at humor over being asked how he is doing everyday and not knowing really what to say because he feels like he isn't getting better.  .  Lab Results: CBC  No results for input(s): WBC, HGB, HCT, PLT in the last 72 hours. BMET No results for input(s): NA, K, CL, CO2, GLUCOSE, BUN, CREATININE, CALCIUM in the last 72 hours. PT/INR No results for input(s): LABPROT, INR in the last 72 hours. ABG No results for input(s): PHART, HCO3 in the last 72 hours.  Invalid input(s): PCO2, PO2  Studies/Results: No results found.  Anti-infectives: Anti-infectives (From admission, onward)   Start     Dose/Rate Route Frequency Ordered Stop   03/16/20 2049  vancomycin (VANCOCIN) powder  Status:  Discontinued          As needed 03/16/20 2049 03/16/20 2357   03/16/20 0600  ceFAZolin (ANCEF) IVPB 2g/100 mL premix   Status:  Discontinued        2 g 200 mL/hr over 30 Minutes Intravenous On call to O.R. 03/15/20 1920 03/17/20 0020   03/12/20 1215  ceFAZolin (ANCEF) IVPB 2g/100 mL premix        2 g 200 mL/hr over 30 Minutes Intravenous On call to O.R. 03/12/20 1026 03/12/20 1833   03/11/20 2345  Ampicillin-Sulbactam (UNASYN) 3 g in sodium chloride 0.9 % 100 mL IVPB        3 g 200 mL/hr over 30 Minutes Intravenous Every 6 hours 03/11/20 2344 03/18/20 2359      Assessment/Plan: PHBC  TBI/F ICC/SDH/SAH/pneumocephalus- per Dr. Maisie Fus, completed Unasyn, Keppra, F/U CT H 7/15 expected enlargement ICCs, F/U CT H 7/17 no signif change, exam stable vs improving?, therapies R orbit FX/R maxillary sinus FX/skull base FXs- per Dr. Uvaldo Rising R humeral head FX dislocation/R clavicle FX/R coracoid FX- relocated in ED by Dr. August Saucer, reconstruction by Dr. August Saucer 7/20. NWB RUE L distal femur FX- S/P ORIF by Dr. Carola Frost 7/15 L bimalleolar ankle FX- S/P ORIF by Dr. Carola Frost 7/15. CAM walker with getting up. NWB LLE Pelvic ring FX- S/P B SI pinning by Dr. Carola Frost 7/15. WBAT RLE for transfers only L glenoid and scapula FXs- per Dr. Carola Frost R radial shaft FX- S/P ORIF by Dr. Carola Frost 7/15 L rib FX 2-4- pain control, pulm toilet ABL anemia  History of + COVID upon admission- not symptomatic, off precautions per IP FEN- regular diet VTE- SCDs, LMWH ID -  Afeb, off ABX Dispo-4NP,TBI team theraoies, unable to have 24/7 support after CIR so will be LOG SNF   Recheck labs tomorrow.     LOS: 25 days    Maudry Diego, MD FACS Surgical Oncology, General Surgery, Trauma and Critical [facility] Surgery, [place] [phone] for weekday/non holidays Check [email or web address] for coverage night/weekend/holidays  Do not use SecureChat as it is not reliable for timely patient care.     04/05/2020

## 2020-04-06 LAB — BASIC METABOLIC PANEL WITH GFR
Anion gap: 12 (ref 5–15)
BUN: 24 mg/dL — ABNORMAL HIGH (ref 6–20)
CO2: 28 mmol/L (ref 22–32)
Calcium: 10 mg/dL (ref 8.9–10.3)
Chloride: 103 mmol/L (ref 98–111)
Creatinine, Ser: 0.75 mg/dL (ref 0.61–1.24)
GFR calc Af Amer: >60 mL/min (ref >60)
GFR calc non Af Amer: >60 mL/min (ref >60)
Glucose, Bld: 114 mg/dL — ABNORMAL HIGH (ref 70–99)
Potassium: 4.5 mmol/L (ref 3.5–5.1)
Sodium: 143 mmol/L (ref 135–145)

## 2020-04-06 LAB — CBC
HCT: 34.1 % — ABNORMAL LOW (ref 39.0–52.0)
Hemoglobin: 11.2 g/dL — ABNORMAL LOW (ref 13.0–17.0)
MCH: 29 pg (ref 26.0–34.0)
MCHC: 32.8 g/dL (ref 30.0–36.0)
MCV: 88.3 fL (ref 80.0–100.0)
Platelets: 449 K/uL — ABNORMAL HIGH (ref 150–400)
RBC: 3.86 MIL/uL — ABNORMAL LOW (ref 4.22–5.81)
RDW: 13.7 % (ref 11.5–15.5)
WBC: 5.3 K/uL (ref 4.0–10.5)
nRBC: 0 % (ref 0.0–0.2)

## 2020-04-06 MED ORDER — BETHANECHOL CHLORIDE 25 MG PO TABS
25.0000 mg | ORAL_TABLET | Freq: Three times a day (TID) | ORAL | Status: DC
  Administered 2020-04-06 (×2): 25 mg via ORAL
  Filled 2020-04-06 (×2): qty 1

## 2020-04-06 MED ORDER — CLONAZEPAM 0.5 MG PO TABS
0.5000 mg | ORAL_TABLET | Freq: Two times a day (BID) | ORAL | Status: DC
  Administered 2020-04-06: 0.5 mg via ORAL
  Filled 2020-04-06: qty 1

## 2020-04-06 NOTE — Progress Notes (Signed)
[facility] Progress Note  21 Days Post-Op  Subjective: No acute complaints   Objective: Vital signs in last 24 hours: Temp:  [97.6 F (36.4 C)-98.9 F (37.2 C)] 98.9 F (37.2 C) (08/09 0754) Pulse Rate:  [87-110] 96 (08/09 0754) Resp:  [14-20] 16 (08/09 0754) BP: (105-143)/(83-96) 143/96 (08/09 0754) SpO2:  [94 %-100 %] 98 % (08/09 0754) Weight:  [67.3 kg] 67.3 kg (08/09 0432) Last BM Date: 04/04/20  Intake/Output from previous day: 08/08 0701 - 08/09 0700 In: 240 [P.O.:240] Out: 650 [Urine:650] Intake/Output this shift: Total I/O In: 118 [P.O.:118] Out: -   PE: General appearance: cooperative ENT: right eyelid droop and healing abrasions.   Resp: clear to auscultation bilaterally Cardio: tachycardic GI: soft, non-tender; bowel sounds normal; no masses,  no organomegaly Extremities: ortho splints/dressings.  Moves all extremities and sensation intact.   Neurologic: disoriented somewhat, speech is slowed and slurred, follows commands   Lab Results:  Recent Labs    04/06/20 0600  WBC 5.3  HGB 11.2*  HCT 34.1*  PLT 449*   BMET Recent Labs    04/06/20 0600  NA 143  K 4.5  CL 103  CO2 28  GLUCOSE 114*  BUN 24*  CREATININE 0.75  CALCIUM 10.0   PT/INR No results for input(s): LABPROT, INR in the last 72 hours. CMP     Component Value Date/Time   NA 143 04/06/2020 0600   K 4.5 04/06/2020 0600   CL 103 04/06/2020 0600   CO2 28 04/06/2020 0600   GLUCOSE 114 (H) 04/06/2020 0600   BUN 24 (H) 04/06/2020 0600   CREATININE 0.75 04/06/2020 0600   CALCIUM 10.0 04/06/2020 0600   PROT 6.0 (L) 03/12/2020 0126   ALBUMIN 3.6 03/12/2020 0126   AST 228 (H) 03/12/2020 0126   ALT 96 (H) 03/12/2020 0126   ALKPHOS 51 03/12/2020 0126   BILITOT 0.6 03/12/2020 0126   GFRNONAA >60 04/06/2020 0600   GFRAA >60 04/06/2020 0600   Lipase  No results found for: LIPASE     Studies/Results: No results found.  Anti-infectives: Anti-infectives (From  admission, onward)   Start     Dose/Rate Route Frequency Ordered Stop   03/16/20 2049  vancomycin (VANCOCIN) powder  Status:  Discontinued          As needed 03/16/20 2049 03/16/20 2357   03/16/20 0600  ceFAZolin (ANCEF) IVPB 2g/100 mL premix  Status:  Discontinued        2 g 200 mL/hr over 30 Minutes Intravenous On call to O.R. 03/15/20 1920 03/17/20 0020   03/12/20 1215  ceFAZolin (ANCEF) IVPB 2g/100 mL premix        2 g 200 mL/hr over 30 Minutes Intravenous On call to O.R. 03/12/20 1026 03/12/20 1833   03/11/20 2345  Ampicillin-Sulbactam (UNASYN) 3 g in sodium chloride 0.9 % 100 mL IVPB        3 g 200 mL/hr over 30 Minutes Intravenous Every 6 hours 03/11/20 2344 03/18/20 2359       Assessment/Plan PHBC  TBI/F ICC/SDH/SAH/pneumocephalus- per Dr. Maisie Fus, completed Unasyn, Keppra, F/U CT H 7/15 expected enlargement ICCs, F/U CT H 7/17 no signif change, exam stable vs improving?, therapies R orbit FX/R maxillary sinus FX/skull base FXs- per Dr. Uvaldo Rising R humeral head FX dislocation/R clavicle FX/R coracoid FX- relocated in ED by Dr. August Saucer, reconstruction by Dr. August Saucer 7/20. NWB RUE L distal femur FX- S/P ORIF by Dr. Carola Frost 7/15 L bimalleolar ankle FX- S/P ORIF by Dr.  Handy 7/15. CAM walker with getting up. NWB LLE Pelvic ring FX- S/P B SI pinning by Dr. Carola Frost 7/15. WBAT RLE for transfers only L glenoid and scapula FXs- per Dr. Carola Frost R radial shaft FX- S/P ORIF by Dr. Carola Frost 7/15 L rib FX 2-4- pain control, pulm toilet ABL anemia  History of + COVID upon admission- not symptomatic, off precautions per IP  FEN-regular diet VTE- SCDs, LMWH ID -Afeb, off ABX  Dispo-4NP,TBI team theraoies, unable to have 24/7 support after CIR so will be LOG SNF. Wean klonopin and urecholine today   LOS: 26 days    Juliet Rude , [facility] 04/06/2020, 11:14 AM Please see Amion for pager number during day hours 7:00am-4:30pm
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
## 2020-04-07 MED ORDER — BETHANECHOL CHLORIDE 10 MG PO TABS
10.0000 mg | ORAL_TABLET | Freq: Three times a day (TID) | ORAL | Status: DC
  Administered 2020-04-07 – 2020-04-08 (×4): 10 mg via ORAL
  Filled 2020-04-07 (×4): qty 1

## 2020-04-07 MED ORDER — CLONAZEPAM 0.25 MG PO TBDP
0.2500 mg | ORAL_TABLET | Freq: Two times a day (BID) | ORAL | Status: DC
  Administered 2020-04-07 (×2): 0.25 mg via ORAL
  Filled 2020-04-07 (×3): qty 1

## 2020-04-07 NOTE — Progress Notes (Signed)
[facility] Progress Note  22 Days Post-Op  Subjective: NAEO. No new complaints. Working with therapies. Asking for an ensure which he called a "coffee syrup" and eventually got out "chocolate".  Per chart review had a BM today.  Objective: Vital signs in last 24 hours: Temp:  [97.8 F (36.6 C)-99.1 F (37.3 C)] 97.8 F (36.6 C) (08/10 0741) Pulse Rate:  [81-106] 106 (08/10 0741) Resp:  [14-18] 15 (08/10 0741) BP: (112-135)/(75-95) 135/93 (08/10 0741) SpO2:  [98 %-99 %] 99 % (08/10 0741) Weight:  [67 kg] 67 kg (08/10 0630) Last BM Date: 04/06/20  Intake/Output from previous day: 08/09 0701 - 08/10 0700 In: 418 [P.O.:418] Out: 800 [Urine:800] Intake/Output this shift: Total I/O In: 400 [P.O.:400] Out: -   PE: General appearance: cooperative ENT: right eyelid droop and healing abrasions.   Resp: clear to auscultation bilaterally Cardio: tachycardic, regular  GI: soft, non-tender; bowel sounds normal; no masses,  no organomegaly Extremities: ortho splints/dressings.  Moves all extremities and sensation intact.   Neurologic: speech is slowed but able to form words, some trouble with word finding, follows commands  Lab Results:  Recent Labs    04/06/20 0600  WBC 5.3  HGB 11.2*  HCT 34.1*  PLT 449*   BMET Recent Labs    04/06/20 0600  NA 143  K 4.5  CL 103  CO2 28  GLUCOSE 114*  BUN 24*  CREATININE 0.75  CALCIUM 10.0   PT/INR No results for input(s): LABPROT, INR in the last 72 hours. CMP     Component Value Date/Time   NA 143 04/06/2020 0600   K 4.5 04/06/2020 0600   CL 103 04/06/2020 0600   CO2 28 04/06/2020 0600   GLUCOSE 114 (H) 04/06/2020 0600   BUN 24 (H) 04/06/2020 0600   CREATININE 0.75 04/06/2020 0600   CALCIUM 10.0 04/06/2020 0600   PROT 6.0 (L) 03/12/2020 0126   ALBUMIN 3.6 03/12/2020 0126   AST 228 (H) 03/12/2020 0126   ALT 96 (H) 03/12/2020 0126   ALKPHOS 51 03/12/2020 0126   BILITOT 0.6 03/12/2020 0126   GFRNONAA >60  04/06/2020 0600   GFRAA >60 04/06/2020 0600   Lipase  No results found for: LIPASE     Studies/Results: No results found.  Anti-infectives: Anti-infectives (From admission, onward)   Start     Dose/Rate Route Frequency Ordered Stop   03/16/20 2049  vancomycin (VANCOCIN) powder  Status:  Discontinued          As needed 03/16/20 2049 03/16/20 2357   03/16/20 0600  ceFAZolin (ANCEF) IVPB 2g/100 mL premix  Status:  Discontinued        2 g 200 mL/hr over 30 Minutes Intravenous On call to O.R. 03/15/20 1920 03/17/20 0020   03/12/20 1215  ceFAZolin (ANCEF) IVPB 2g/100 mL premix        2 g 200 mL/hr over 30 Minutes Intravenous On call to O.R. 03/12/20 1026 03/12/20 1833   03/11/20 2345  Ampicillin-Sulbactam (UNASYN) 3 g in sodium chloride 0.9 % 100 mL IVPB        3 g 200 mL/hr over 30 Minutes Intravenous Every 6 hours 03/11/20 2344 03/18/20 2359       Assessment/Plan PHBC  TBI/F ICC/SDH/SAH/pneumocephalus- per Dr. Maisie Fus, completed Unasyn, Keppra, F/U CT H 7/15 expected enlargement ICCs, F/U CT H 7/17 no signif change, exam stable vs improving?, therapies R orbit FX/R maxillary sinus FX/skull base FXs- per Dr. Uvaldo Rising R humeral head FX dislocation/R clavicle FX/R  coracoid FX- relocated in ED by Dr. August Saucer, reconstruction by Dr. August Saucer 7/20. NWB RUE L distal femur FX- S/P ORIF by Dr. Carola Frost 7/15 L bimalleolar ankle FX- S/P ORIF by Dr. Carola Frost 7/15. CAM walker with getting up. NWB LLE Pelvic ring FX- S/P B SI pinning by Dr. Carola Frost 7/15. WBAT RLE for transfers only L glenoid and scapula FXs- per Dr. Carola Frost R radial shaft FX- S/P ORIF by Dr. Carola Frost 7/15 L rib FX 2-4- pain control, pulm toilet ABL anemia  History of + COVID upon admission- not symptomatic, off precautions per IP  FEN-regular diet VTE- SCDs, LMWH ID -Afeb, off ABX  Dispo-4NP,TBI team theraoies, unable to have 24/7 support after CIR so will be LOG SNF. Wean klonopin and urecholine again today - can  probably D/C these medications tomorrow 8/11.    LOS: 27 days    Adam Phenix , [facility] Surgery 04/07/2020, 9:44 AM Please see Amion for pager number during day hours 7:00am-4:30pm
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
## 2020-04-08 MED ORDER — QUETIAPINE FUMARATE 50 MG PO TABS
50.0000 mg | ORAL_TABLET | Freq: Two times a day (BID) | ORAL | Status: DC
  Administered 2020-04-08 – 2020-04-09 (×2): 50 mg via ORAL
  Filled 2020-04-08 (×2): qty 1

## 2020-04-08 MED ORDER — QUETIAPINE FUMARATE 50 MG PO TABS: 25.0000 mg | ORAL_TABLET | Freq: Two times a day (BID) | ORAL | Status: DC

## 2020-04-08 NOTE — Progress Notes (Signed)
[facility] Progress Note  23 Days Post-Op  Subjective: NAEO. No new complaints.   AFVSS Objective: Vital signs in last 24 hours: Temp:  [97.9 F (36.6 C)-99.5 F (37.5 C)] 99 F (37.2 C) (08/11 0810) Pulse Rate:  [85-110] 105 (08/11 0810) Resp:  [15-17] 15 (08/11 0810) BP: (121-127)/(80-94) 121/80 (08/11 0810) SpO2:  [98 %-100 %] 99 % (08/11 0810) Weight:  [67.5 kg] 67.5 kg (08/11 0500) Last BM Date: 04/07/20  Intake/Output from previous day: 08/10 0701 - 08/11 0700 In: 650 [P.O.:650] Out: -  Intake/Output this shift: No intake/output data recorded.  PE: General appearance: cooperative ENT: right eyelid droop and healing abrasions.   Resp: clear to auscultation bilaterally Cardio: tachycardic, regular  GI: soft, non-tender; bowel sounds normal; no masses,  no organomegaly Extremities: ortho splints/dressings.  Moves all extremities and sensation intact.   Neurologic: speech is slowed but able to form words, some trouble with word finding, follows commands  Lab Results:  Recent Labs    04/06/20 0600  WBC 5.3  HGB 11.2*  HCT 34.1*  PLT 449*   BMET Recent Labs    04/06/20 0600  NA 143  K 4.5  CL 103  CO2 28  GLUCOSE 114*  BUN 24*  CREATININE 0.75  CALCIUM 10.0   PT/INR No results for input(s): LABPROT, INR in the last 72 hours. CMP     Component Value Date/Time   NA 143 04/06/2020 0600   K 4.5 04/06/2020 0600   CL 103 04/06/2020 0600   CO2 28 04/06/2020 0600   GLUCOSE 114 (H) 04/06/2020 0600   BUN 24 (H) 04/06/2020 0600   CREATININE 0.75 04/06/2020 0600   CALCIUM 10.0 04/06/2020 0600   PROT 6.0 (L) 03/12/2020 0126   ALBUMIN 3.6 03/12/2020 0126   AST 228 (H) 03/12/2020 0126   ALT 96 (H) 03/12/2020 0126   ALKPHOS 51 03/12/2020 0126   BILITOT 0.6 03/12/2020 0126   GFRNONAA >60 04/06/2020 0600   GFRAA >60 04/06/2020 0600   Lipase  No results found for: LIPASE     Studies/Results: No results  found.  Anti-infectives: Anti-infectives (From admission, onward)   Start     Dose/Rate Route Frequency Ordered Stop   03/16/20 2049  vancomycin (VANCOCIN) powder  Status:  Discontinued          As needed 03/16/20 2049 03/16/20 2357   03/16/20 0600  ceFAZolin (ANCEF) IVPB 2g/100 mL premix  Status:  Discontinued        2 g 200 mL/hr over 30 Minutes Intravenous On call to O.R. 03/15/20 1920 03/17/20 0020   03/12/20 1215  ceFAZolin (ANCEF) IVPB 2g/100 mL premix        2 g 200 mL/hr over 30 Minutes Intravenous On call to O.R. 03/12/20 1026 03/12/20 1833   03/11/20 2345  Ampicillin-Sulbactam (UNASYN) 3 g in sodium chloride 0.9 % 100 mL IVPB        3 g 200 mL/hr over 30 Minutes Intravenous Every 6 hours 03/11/20 2344 03/18/20 2359       Assessment/Plan PHBC  TBI/F ICC/SDH/SAH/pneumocephalus- per Dr. Maisie Fus, completed Unasyn, Keppra, F/U CT H 7/15 expected enlargement ICCs, F/U CT H 7/17 no signif change, exam stable vs improving?, therapies R orbit FX/R maxillary sinus FX/skull base FXs- per Dr. Uvaldo Rising R humeral head FX dislocation/R clavicle FX/R coracoid FX- relocated in ED by Dr. August Saucer, reconstruction by Dr. August Saucer 7/20. NWB RUE L distal femur FX- S/P ORIF by Dr. Carola Frost 7/15 L bimalleolar ankle  FX- S/P ORIF by Dr. Carola Frost 7/15. CAM walker with getting up. NWB LLE Pelvic ring FX- S/P B SI pinning by Dr. Carola Frost 7/15. WBAT RLE for transfers only L glenoid and scapula FXs- per Dr. Carola Frost R radial shaft FX- S/P ORIF by Dr. Carola Frost 7/15 L rib FX 2-4- pain control, pulm toilet ABL anemia  History of + COVID upon admission- not symptomatic, off precautions per IP  FEN-regular diet VTE- SCDs, LMWH ID -Afeb, off ABX  Dispo-4NP,TBI team theraoies, unable to have 24/7 support after CIR so will be LOG SNF. D/C klonopin and urecholine. Wean Seroquel.    LOS: 28 days    Adam Phenix , [facility] Surgery 04/08/2020, 9:46 AM Please see Amion for pager number  during day hours 7:00am-4:30pm
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
## 2020-04-08 NOTE — Progress Notes (Signed)
Patient refused a lot of his medications this morning because he said it was early and he wasn't ready for it.

## 2020-04-09 ENCOUNTER — Inpatient Hospital Stay (HOSPITAL_COMMUNITY): Payer: 59

## 2020-04-09 MED ORDER — QUETIAPINE FUMARATE 50 MG PO TABS
50.0000 mg | ORAL_TABLET | Freq: Every day | ORAL | Status: DC
  Administered 2020-04-09: 50 mg via ORAL
  Filled 2020-04-09: qty 1

## 2020-04-09 MED ORDER — OXYCODONE HCL 5 MG PO TABS
5.0000 mg | ORAL_TABLET | Freq: Four times a day (QID) | ORAL | Status: DC | PRN
  Administered 2020-04-15 – 2020-04-16 (×2): 5 mg via ORAL
  Filled 2020-04-09 (×2): qty 1

## 2020-04-09 MED ORDER — QUETIAPINE FUMARATE 50 MG PO TABS: 50.0000 mg | ORAL_TABLET | Freq: Every day | ORAL | Status: DC

## 2020-04-09 NOTE — Progress Notes (Addendum)
[facility] Progress Note  24 Days Post-Op  Subjective: NAEO. No new complaints.   AFVSS Objective: Vital signs in last 24 hours: Temp:  [98.1 F (36.7 C)-98.8 F (37.1 C)] 98.3 F (36.8 C) (08/12 1251) Pulse Rate:  [90-102] 99 (08/12 1251) Resp:  [18-20] 18 (08/12 1251) BP: (108-136)/(76-98) 117/81 (08/12 1251) SpO2:  [98 %-100 %] 100 % (08/12 1251) Weight:  [65.3 kg] 65.3 kg (08/12 0500) Last BM Date: 04/08/20  Intake/Output from previous day: 08/11 0701 - 08/12 0700 In: 540 [P.O.:540] Out: 1400 [Urine:1400] Intake/Output this shift: Total I/O In: 240 [P.O.:240] Out: 300 [Urine:300]  PE: General appearance: cooperative ENT: right eyelid droop and healing abrasions.   Resp: clear to auscultation bilaterally Cardio: tachycardic, regular  GI: soft, non-tender; bowel sounds normal; no masses,  no organomegaly Extremities: ortho splints/dressings.  Moves all extremities and sensation intact.   Neurologic: speech is slowed but able to form words, some trouble with word finding, follows commands  Lab Results:  No results for input(s): WBC, HGB, HCT, PLT in the last 72 hours. BMET No results for input(s): NA, K, CL, CO2, GLUCOSE, BUN, CREATININE, CALCIUM in the last 72 hours. PT/INR No results for input(s): LABPROT, INR in the last 72 hours. CMP     Component Value Date/Time   NA 143 04/06/2020 0600   K 4.5 04/06/2020 0600   CL 103 04/06/2020 0600   CO2 28 04/06/2020 0600   GLUCOSE 114 (H) 04/06/2020 0600   BUN 24 (H) 04/06/2020 0600   CREATININE 0.75 04/06/2020 0600   CALCIUM 10.0 04/06/2020 0600   PROT 6.0 (L) 03/12/2020 0126   ALBUMIN 3.6 03/12/2020 0126   AST 228 (H) 03/12/2020 0126   ALT 96 (H) 03/12/2020 0126   ALKPHOS 51 03/12/2020 0126   BILITOT 0.6 03/12/2020 0126   GFRNONAA >60 04/06/2020 0600   GFRAA >60 04/06/2020 0600   Lipase  No results found for: LIPASE     Studies/Results: No results  found.  Anti-infectives: Anti-infectives (From admission, onward)   Start     Dose/Rate Route Frequency Ordered Stop   03/16/20 2049  vancomycin (VANCOCIN) powder  Status:  Discontinued          As needed 03/16/20 2049 03/16/20 2357   03/16/20 0600  ceFAZolin (ANCEF) IVPB 2g/100 mL premix  Status:  Discontinued        2 g 200 mL/hr over 30 Minutes Intravenous On call to O.R. 03/15/20 1920 03/17/20 0020   03/12/20 1215  ceFAZolin (ANCEF) IVPB 2g/100 mL premix        2 g 200 mL/hr over 30 Minutes Intravenous On call to O.R. 03/12/20 1026 03/12/20 1833   03/11/20 2345  Ampicillin-Sulbactam (UNASYN) 3 g in sodium chloride 0.9 % 100 mL IVPB        3 g 200 mL/hr over 30 Minutes Intravenous Every 6 hours 03/11/20 2344 03/18/20 2359       Assessment/Plan PHBC  TBI/F ICC/SDH/SAH/pneumocephalus- per Dr. Maisie Fus, completed Unasyn, Keppra, F/U CT H 7/15 expected enlargement ICCs, F/U CT H 7/17 no signif change, exam stable vs improving?, therapies R orbit FX/R maxillary sinus FX/skull base FXs- per Dr. Uvaldo Rising R humeral head FX dislocation/R clavicle FX/R coracoid FX- relocated in ED by Dr. August Saucer, reconstruction by Dr. August Saucer 7/20. NWB RUE L distal femur FX- S/P ORIF by Dr. Carola Frost 7/15 L bimalleolar ankle FX- S/P ORIF by Dr. Carola Frost 7/15. CAM walker with getting up. NWB LLE Pelvic ring FX- S/P B SI  pinning by Dr. Carola Frost 7/15. WBAT RLE for transfers only L glenoid and scapula FXs- per Dr. Carola Frost R radial shaft FX- S/P ORIF by Dr. Carola Frost 7/15 L rib FX 2-4- pain control, pulm toilet ABL anemia  History of + COVID upon admission- not symptomatic, off precautions per IP  FEN-regular diet VTE- SCDs, LMWH ID -Afeb, off ABX  Dispo-4NP,TBI team therapies, unable to have 24/7 support after CIR so will be SNF. Wean Seroquel. Appreciate CM working on placement.   Ortho repeated x-rays today. Follow up their recs.   LOS: 29 days    Adam Phenix , [facility]  Surgery 04/09/2020, 1:20 PM Please see Amion for pager number during day hours 7:00am-4:30pm

## 2020-04-09 NOTE — Progress Notes (Signed)
Nutrition Follow-up  DOCUMENTATION CODES:   Not applicable  INTERVENTION:  ContinueEnsure Enlive poTID, each supplement provides 350 kcal and 20 grams of protein  Continue30 ml Prosource pluspoTID, each supplement provides 100 kcal and 15 grams of protein.  Encourage adequate PO intake.   NUTRITION DIAGNOSIS:   Increased nutrient needs related to wound healing as evidenced by estimated needs; ongoing  GOAL:   Patient will meet greater than or equal to 90% of their needs; met   MONITOR:   PO intake, Supplement acceptance, Skin, Weight trends, Labs, I & O's  REASON FOR ASSESSMENT:   Consult, Ventilator Enteral/tube feeding initiation and management  ASSESSMENT:   [facility] admitted with TBI, SDH, SAH, pneumocephalus, ICC, R orbit fx, R maxillary sinus fx, skull base fxs, R humeral head fx dislocation, R clavicle fx, R coracoid fx, L distal femur fx s/p ORIF 7/15, L ankle fx s/p ORIF 7/15, pelvic ring fx s/p pinning 7/15, L scapula fxs, R radial shaft fx s/p ORIF 7/15, and L rib fx 2-4. Pt covid + on admission. 7/23- extubated  Meal completion has been 50-100%. Pt currently has Ensure and Prosource plus ordered and has been consuming them. Pt has been tolerating his diet. RD to continue with current nutritional supplementation to aid in caloric and protein needs.  Labs and medications reviewed.   Diet Order:   Diet Order            Diet regular Room service appropriate? Yes; Fluid consistency: Thin  Diet effective now                 EDUCATION NEEDS:   No education needs have been identified at this time  Skin:  Skin Assessment: Skin Integrity Issues: Skin Integrity Issues:: Incisions Incisions: R arm, L leg, L thigh, L hip Other: N/A  Last BM:  8/11  Height:   Ht Readings from Last 1 Encounters:  03/22/20 '5\' 11"'  (1.803 m)    Weight:   Wt Readings from Last 1 Encounters:  04/09/20 65.3 kg    Ideal Body Weight:  80.9 kg  BMI:  Body mass index is  20.08 kg/m.  Estimated Nutritional Needs:   Kcal:  2200-2400  Protein:  135-160 grams  Fluid:  2 L/day  Corrin Parker, MS, RD, LDN RD pager number/after hours weekend pager number on Amion.

## 2020-04-09 NOTE — TOC Progression Note (Signed)
Transition of Care [facility]) - Progression Note    Patient Details  Name: [name] MRN: [id number] Date of Birth: 1981/03/08  [facility]) CM/SW Contact  Astrid Drafts Berna Spare, RN Phone Number: 04/09/2020, 5:10 PM  Clinical Narrative: Patient's insurance updated after mother provided information on insurance policy.  Patient has [employer]; Re- faxed out patient information for skilled nursing facility bed search with updated information.  Will submit for PASSR and provide updates as available.    Expected Discharge Plan: Skilled Nursing Facility Barriers to Discharge: Continued Medical Work up, Inadequate or no insurance  Expected Discharge Plan and Services Expected Discharge Plan: Skilled Nursing Facility   Discharge Planning Services: CM Consult   Living arrangements for the past 2 months: Homeless                                       Social Determinants of Health (SDOH) Interventions    Readmission Risk Interventions No flowsheet data found.  Quintella Baton, RN, BSN  Trauma/Neuro ICU Case Manager [phone]
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
## 2020-04-10 MED ORDER — QUETIAPINE FUMARATE 50 MG PO TABS
25.0000 mg | ORAL_TABLET | Freq: Every day | ORAL | Status: DC
  Administered 2020-04-10: 25 mg via ORAL
  Filled 2020-04-10: qty 1
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
## 2020-04-10 NOTE — Progress Notes (Signed)
OT NOTE  Pt with notes from Dr. August Saucer reporting mobilization of R UE as patient following commands and 4 weeks post op. The patient will be post op 4 weeks on 8/17. Please update OT orders to reflect increased ROM to R UE.   Timmothy Euler, OTR/L  Acute Rehabilitation Services Pager: [phone] Office: [phone] .
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
## 2020-04-12 NOTE — Progress Notes (Signed)
27 Days Post-Op   Subjective/Chief Complaint: No acute changes    Objective: Vital signs in last 24 hours: Temp:  [98 F (36.7 C)-98.5 F (36.9 C)] 98.5 F (36.9 C) (08/15 0750) Pulse Rate:  [89-100] 90 (08/15 0750) Resp:  [18-20] 20 (08/15 0750) BP: (108-129)/(79-92) 108/85 (08/15 0750) SpO2:  [96 %-100 %] 100 % (08/15 0750) Last BM Date: 04/10/20  Intake/Output from previous day: 08/14 0701 - 08/15 0700 In: 240 [P.O.:240] Out: -  Intake/Output this shift: No intake/output data recorded.  General appearance: cooperative, NAD ENT: right eyelid droop and healing abrasions. Resp: clear to auscultation bilaterally Cardio:RRR GI: soft, non-tender; bowel sounds normal; no masses, no organomegaly Extremities: ortho splints/dressings.Healing RLE abarasions.Moves all extremities and sensation intact.  Neurologic: follows commands; speaks very slowly  Lab Results:  No results for input(s): WBC, HGB, HCT, PLT in the last 72 hours. BMET No results for input(s): NA, K, CL, CO2, GLUCOSE, BUN, CREATININE, CALCIUM in the last 72 hours. PT/INR No results for input(s): LABPROT, INR in the last 72 hours. ABG No results for input(s): PHART, HCO3 in the last 72 hours.  Invalid input(s): PCO2, PO2  Studies/Results: No results found.  Anti-infectives: Anti-infectives (From admission, onward)   Start     Dose/Rate Route Frequency Ordered Stop   03/16/20 2049  vancomycin (VANCOCIN) powder  Status:  Discontinued          As needed 03/16/20 2049 03/16/20 2357   03/16/20 0600  ceFAZolin (ANCEF) IVPB 2g/100 mL premix  Status:  Discontinued        2 g 200 mL/hr over 30 Minutes Intravenous On call to O.R. 03/15/20 1920 03/17/20 0020   03/12/20 1215  ceFAZolin (ANCEF) IVPB 2g/100 mL premix        2 g 200 mL/hr over 30 Minutes Intravenous On call to O.R. 03/12/20 1026 03/12/20 1833   03/11/20 2345  Ampicillin-Sulbactam (UNASYN) 3 g in sodium chloride 0.9 % 100 mL IVPB        3  g 200 mL/hr over 30 Minutes Intravenous Every 6 hours 03/11/20 2344 03/18/20 2359      Assessment/Plan: PHBC  TBI/F ICC/SDH/SAH/pneumocephalus- per Dr. Maisie Fus, completed Unasyn, Keppra, F/U CT H 7/15 expected enlargement ICCs, F/U CT H 7/17 no signif change, exam stablevs improving?, therapies R orbit FX/R maxillary sinus FX/skull base FXs- per Dr. Uvaldo Rising R humeral head FX dislocation/R clavicle FX/R coracoid FX- relocated in ED by Dr. August Saucer, reconstruction by Dr. August Saucer 7/20. NWB RUE L distal femur FX- S/P ORIF by Dr. Carola Frost 7/15 L bimalleolar ankle FX- S/P ORIF by Dr. Carola Frost 7/15. CAM walker with getting up. NWB LLE Pelvic ring FX- S/P B SI pinning by Dr. Carola Frost 7/15.WBAT RLEfor transfers only L glenoid and scapula FXs- per Dr. Carola Frost R radial shaft FX- S/P ORIF by Dr. Carola Frost 7/15 L rib FX 2-4- pain control, pulm toilet ABL anemia History of +COVIDupon admission- not symptomatic, off precautions per IP  FEN-regular diet VTE- SCDs, LMWH ID -Afeb, off ABX  Dispo-4NP,TBI team therapies, unable to have 24/7 support after CIR so will be SNF. Appreciate CM working on placement.  LOS: 32 days    Wynona Luna 04/12/2020

## 2020-04-13 NOTE — Progress Notes (Signed)
28 Days Post-Op   Subjective/Chief Complaint: No acute changes Objective: Vital signs in last 24 hours: Temp:  [98 F (36.7 C)-100 F (37.8 C)] 98.9 F (37.2 C) (08/16 0803) Pulse Rate:  [77-98] 86 (08/16 0803) Resp:  [14-20] 14 (08/16 0803) BP: (107-132)/(74-89) 115/82 (08/16 0803) SpO2:  [97 %-100 %] 100 % (08/16 0803) Last BM Date: 04/10/20  Intake/Output from previous day: 08/15 0701 - 08/16 0700 In: 400 [P.O.:400] Out: 1 [Stool:1] Intake/Output this shift: No intake/output data recorded.  General appearance:resting comfortably, NAD ENT: right eyelid droop and healing abrasions. Resp: clear to auscultation bilaterally Cardio:RRR GI: soft, non-tender; bowel sounds normal; no masses, no organomegaly Extremities: ortho splints/dressings.Healing RLE abarasions.Moves all extremities and sensation intact.  Neurologic: follows commands; speaks very slowly  Anti-infectives: Anti-infectives (From admission, onward)   Start     Dose/Rate Route Frequency Ordered Stop   03/16/20 2049  vancomycin (VANCOCIN) powder  Status:  Discontinued          As needed 03/16/20 2049 03/16/20 2357   03/16/20 0600  ceFAZolin (ANCEF) IVPB 2g/100 mL premix  Status:  Discontinued        2 g 200 mL/hr over 30 Minutes Intravenous On call to O.R. 03/15/20 1920 03/17/20 0020   03/12/20 1215  ceFAZolin (ANCEF) IVPB 2g/100 mL premix        2 g 200 mL/hr over 30 Minutes Intravenous On call to O.R. 03/12/20 1026 03/12/20 1833   03/11/20 2345  Ampicillin-Sulbactam (UNASYN) 3 g in sodium chloride 0.9 % 100 mL IVPB        3 g 200 mL/hr over 30 Minutes Intravenous Every 6 hours 03/11/20 2344 03/18/20 2359      Assessment/Plan: PHBC  TBI/F ICC/SDH/SAH/pneumocephalus- per Dr. Maisie Fus, completed Unasyn, Keppra, F/U CT H 7/15 expected enlargement ICCs, F/U CT H 7/17 no signif change, exam stablevs improving?, therapies R orbit FX/R maxillary sinus FX/skull base FXs- per Dr. Uvaldo Rising R humeral head  FX dislocation/R clavicle FX/R coracoid FX- relocated in ED by Dr. August Saucer, reconstruction by Dr. August Saucer 7/20. NWB RUE - but may be able to be cleared for mobilization this week as it is almost 4 weeks post-op, will reach out to Dr. August Saucer. L distal femur FX- S/P ORIF by Dr. Carola Frost 7/15 L bimalleolar ankle FX- S/P ORIF by Dr. Carola Frost 7/15. CAM walker with getting up. NWB LLE Pelvic ring FX- S/P B SI pinning by Dr. Carola Frost 7/15.WBAT RLEfor transfers only L glenoid and scapula FXs- per Dr. Carola Frost R radial shaft FX- S/P ORIF by Dr. Carola Frost 7/15 L rib FX 2-4- pain control, pulm toilet ABL anemia History of +COVIDupon admission- not symptomatic, off precautions per IP  FEN-regular diet VTE- SCDs, LMWH ID -Afeb, off ABX  Dispo-4NP,TBI team therapies, unable to have 24/7 support after CIR so will be SNF. Appreciate CM working on placement.   LOS: 33 days    Adam Phenix 04/13/2020
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
## 2020-04-14 ENCOUNTER — Other Ambulatory Visit: Payer: Self-pay

## 2020-04-14 ENCOUNTER — Inpatient Hospital Stay (HOSPITAL_COMMUNITY): Payer: 59

## 2020-04-14 ENCOUNTER — Encounter (HOSPITAL_COMMUNITY): Payer: Self-pay

## 2020-04-14 NOTE — Progress Notes (Signed)
Patient ID: Anthony Macias, male   DOB: 1981-02-09, 39 y.o.   MRN: [id number] 29 Days Post-Op   Subjective: Reports he is tired  ROS negative except as listed above. Objective: Vital signs in last 24 hours: Temp:  [98.1 F (36.7 C)-98.7 F (37.1 C)] 98.7 F (37.1 C) (08/17 0703) Pulse Rate:  [62-86] 62 (08/17 0703) Resp:  [13-18] 13 (08/17 0703) BP: (108-119)/(70-83) 108/72 (08/17 0703) SpO2:  [97 %-100 %] 98 % (08/17 0703) Weight:  [66.2 kg] 66.2 kg (08/17 0305) Last BM Date: 04/13/20  Intake/Output from previous day: 08/16 0701 - 08/17 0700 In: 400 [P.O.:400] Out: -  Intake/Output this shift: Total I/O In: -  Out: 500 [Urine:500]  General appearance: cooperative Resp: clear to auscultation bilaterally Cardio: regular rate and rhythm GI: soft, NT Extremities: RUE sling, LLE brace Neuro: arouses and [name] well, less confused  Lab Results: CBC  No results for input(s): WBC, HGB, HCT, PLT in the last 72 hours. BMET No results for input(s): NA, K, CL, CO2, GLUCOSE, BUN, CREATININE, CALCIUM in the last 72 hours. PT/INR No results for input(s): LABPROT, INR in the last 72 hours. ABG No results for input(s): PHART, HCO3 in the last 72 hours.  Invalid input(s): PCO2, PO2  Studies/Results: No results found.  Anti-infectives: Anti-infectives (From admission, onward)   Start     Dose/Rate Route Frequency Ordered Stop   03/16/20 2049  vancomycin (VANCOCIN) powder  Status:  Discontinued          As needed 03/16/20 2049 03/16/20 2357   03/16/20 0600  ceFAZolin (ANCEF) IVPB 2g/100 mL premix  Status:  Discontinued        2 g 200 mL/hr over 30 Minutes Intravenous On call to O.R. 03/15/20 1920 03/17/20 0020   03/12/20 1215  ceFAZolin (ANCEF) IVPB 2g/100 mL premix        2 g 200 mL/hr over 30 Minutes Intravenous On call to O.R. 03/12/20 1026 03/12/20 1833   03/11/20 2345  Ampicillin-Sulbactam (UNASYN) 3 g in sodium chloride 0.9 % 100 mL IVPB        3 g 200 mL/hr over 30  Minutes Intravenous Every 6 hours 03/11/20 2344 03/18/20 2359      Assessment/Plan: PHBC  TBI/F ICC/SDH/SAH/pneumocephalus- per Dr. Maisie Fus, completed Unasyn, Keppra, F/U CT H 7/15 expected enlargement ICCs, F/U CT H 7/17 no signif change, exam stablevs improving?, therapies R orbit FX/R maxillary sinus FX/skull base FXs- per Dr. Uvaldo Rising R humeral head FX dislocation/R clavicle FX/R coracoid FX- relocated in ED by Dr. August Saucer, reconstruction by Dr. August Saucer 7/20. I reached out to Dr. August Saucer to see if we can update ROM/WB status L distal femur FX- S/P ORIF by Dr. Carola Frost 7/15 L bimalleolar ankle FX- S/P ORIF by Dr. Carola Frost 7/15. CAM walker with getting up, NWB LLE Pelvic ring FX- S/P B SI pinning by Dr. Carola Frost 7/15.WBAT RLEfor transfers only L glenoid and scapula FXs- per Dr. Carola Frost R radial shaft FX- S/P ORIF by Dr. Carola Frost 7/15 L rib FX 2-4- pain control, pulm toilet ABL anemia History of +COVIDupon admission- not symptomatic, off precautions per IP  FEN-regular diet VTE- SCDs, LMWH ID -Afeb, off ABX  Dispo-4NP,TBI team therapies, TOC team working on SNF placement  LOS: 34 days    Violeta Gelinas, MD, MPH, FACS Trauma & General Surgery Use [email or web address] to contact on call provider  04/14/2020

## 2020-04-14 NOTE — Progress Notes (Addendum)
  Subjective: Patient is a 39 year old male who presents approximately 1 month out from right shoulder greater tuberosity ORIF.  He states that he is doing well in regards to his right shoulder and denies any significant pain.  His main complaint is that his right shoulder feels stiff.  He notes that it feels comfortable in the sling.   Objective: Vital signs in last 24 hours: Temp:  [98.1 F (36.7 C)-98.7 F (37.1 C)] 98.7 F (37.1 C) (08/17 0703) Pulse Rate:  [62-86] 62 (08/17 0703) Resp:  [13-18] 13 (08/17 0703) BP: (108-119)/(70-81) 108/72 (08/17 0703) SpO2:  [97 %-100 %] 98 % (08/17 0703) Weight:  [66.2 kg] 66.2 kg (08/17 0305)  Intake/Output from previous day: 08/16 0701 - 08/17 0700 In: 400 [P.O.:400] Out: -  Intake/Output this shift: Total I/O In: 700 [P.O.:700] Out: 900 [Urine:900]  Exam:  Right shoulder with comparable contour to the contralateral shoulder.  No significant deformity suggesting dislocation.  Appropriate stiffness of the right shoulder with passive range of motion.  Axillary nerve intact with deltoid firing on exam.  Postoperative dressing in place without any surrounding erythema.  Labs: No results for input(s): HGB in the last 72 hours. No results for input(s): WBC, RBC, HCT, PLT in the last 72 hours. No results for input(s): NA, K, CL, CO2, BUN, CREATININE, GLUCOSE, CALCIUM in the last 72 hours. No results for input(s): LABPT, INR in the last 72 hours.  Assessment/Plan: Patient is a 39 year old male who presents about a month out from right shoulder greater tuberosity ORIF.  Plan for mobilization with pendulum exercises and active/passive ROM as tolerated after viewing xrays. Okay for strengthening as well.      Jaidon Sponsel L Daisha Filosa 04/14/2020, 4:16 PM

## 2020-04-15 NOTE — Progress Notes (Signed)
Nutrition Follow-up  DOCUMENTATION CODES:   Not applicable  INTERVENTION:  ContinueEnsure Enlive poTID, each supplement provides 350 kcal and 20 grams of protein  Continue30 ml Prosource pluspoTID, each supplement provides 100 kcal and 15 grams of protein.  Encourage adequate PO intake.  NUTRITION DIAGNOSIS:   Increased nutrient needs related to wound healing as evidenced by estimated needs; ongoing  GOAL:   Patient will meet greater than or equal to 90% of their needs; progressing  MONITOR:   PO intake, Supplement acceptance, Skin, Weight trends, Labs, I & O's  REASON FOR ASSESSMENT:   Consult, Ventilator Enteral/tube feeding initiation and management  ASSESSMENT:   [facility] admitted with TBI, SDH, SAH, pneumocephalus, ICC, R orbit fx, R maxillary sinus fx, skull base fxs, R humeral head fx dislocation, R clavicle fx, R coracoid fx, L distal femur fx s/p ORIF 7/15, L ankle fx s/p ORIF 7/15, pelvic ring fx s/p pinning 7/15, L scapula fxs, R radial shaft fx s/p ORIF 7/15, and L rib fx 2-4. Pt covid + on admission. 7/23- extubated  Recent meal completion has been 10-50%. Pt currently has Ensure and Prosource plus ordered TID and has been consuming them. RD to continue with current orders to aid in caloric and protein needs. Pt encouraged to eat his food at meals and to drink his supplements.   Labs and medications reviewed.   Diet Order:   Diet Order            Diet regular Room service appropriate? Yes; Fluid consistency: Thin  Diet effective now                 EDUCATION NEEDS:   No education needs have been identified at this time  Skin:  Skin Assessment: Reviewed RN Assessment Skin Integrity Issues:: Incisions Incisions: R arm, L leg, L thigh, L hip Other: N/A  Last BM:  8/16  Height:   Ht Readings from Last 1 Encounters:  03/22/20 5\' 11"  (1.803 m)    Weight:   Wt Readings from Last 1 Encounters:  04/15/20 66.2 kg    Ideal Body Weight:   80.9 kg  BMI:  Body mass index is 20.36 kg/m.  Estimated Nutritional Needs:   Kcal:  2200-2400  Protein:  135-160 grams  Fluid:  2 L/day  04/17/20, MS, RD, LDN RD pager number/after hours weekend pager number on Amion.
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
## 2020-04-15 NOTE — Progress Notes (Addendum)
OT NOTE  Requesting via secure chat updated orders for R wrist for increased activity and wear for the wrist splint currently in place. Pt is now 4 weeks 2 days post op.  Addendum 16:17 per Montez Morita: AROM as tolerated to R elbow hand wrist and forearm. Wrist splint to be dced ( order placed to reflect) R shoulder per Dr August Saucer orders   Timmothy Euler, OTR/L  Acute Rehabilitation Services Pager: [phone] Office: [phone] .

## 2020-04-15 NOTE — Progress Notes (Signed)
Patient ID: Anthony Macias, male   DOB: 09-24-1980, 39 y.o.   MRN: [id number] 30 Days Post-Op   Subjective: Reports feeling tired. Denies pain. Some nerve pain in R arm.   ROS negative except as listed above. Objective: Vital signs in last 24 hours: Temp:  [97.7 F (36.5 C)-98.9 F (37.2 C)] 98.9 F (37.2 C) (08/18 0755) Pulse Rate:  [60-87] 87 (08/18 0755) Resp:  [14-20] 14 (08/18 0755) BP: (113-123)/(74-85) 113/74 (08/18 0755) SpO2:  [98 %-100 %] 98 % (08/18 0755) Weight:  [66.2 kg] 66.2 kg (08/18 0500) Last BM Date: 04/13/20  Intake/Output from previous day: 08/17 0701 - 08/18 0700 In: 700 [P.O.:700] Out: 1400 [Urine:1400] Intake/Output this shift: Total I/O In: -  Out: 350 [Urine:350]  General appearance: cooperative Resp: clear to auscultation bilaterally Cardio: regular rate and rhythm GI: soft, NT Extremities: RUE sling, LLE brace Neuro: arouses and [name] well, less confused  Lab Results: CBC  No results for input(s): WBC, HGB, HCT, PLT in the last 72 hours. BMET No results for input(s): NA, K, CL, CO2, GLUCOSE, BUN, CREATININE, CALCIUM in the last 72 hours. PT/INR No results for input(s): LABPROT, INR in the last 72 hours. ABG No results for input(s): PHART, HCO3 in the last 72 hours.  Invalid input(s): PCO2, PO2  Studies/Results: DG Shoulder Right Port  Result Date: 04/14/2020 CLINICAL DATA:  Nondisplaced fracture of coracoid process. EXAM: PORTABLE RIGHT SHOULDER COMPARISON:  Shoulder radiograph 03/12/2020, MRI 03/15/2020. FINDINGS: Screw fixation of coracoid process fracture, fracture line not well seen by radiograph. Increasing heterotopic calcification and peripheral callus formation about the greater tuberosity fracture. Callus formation about the inferior glenoid and at site of fracture on MRI, with likely calcifications along the inferior joint capsule. No dislocation. IMPRESSION: 1. Screw fixation of coracoid process fracture, fracture line not  well seen by radiograph. 2. Increasing heterotopic calcification and peripheral callus formation about the greater tuberosity fracture. 3. Developing callus/heterotopic calcification about the inferior glenoid and likely inferior joint capsule. Electronically Signed   By: Narda Rutherford M.D.   On: 04/14/2020 19:29    Anti-infectives: Anti-infectives (From admission, onward)   Start     Dose/Rate Route Frequency Ordered Stop   03/16/20 2049  vancomycin (VANCOCIN) powder  Status:  Discontinued          As needed 03/16/20 2049 03/16/20 2357   03/16/20 0600  ceFAZolin (ANCEF) IVPB 2g/100 mL premix  Status:  Discontinued        2 g 200 mL/hr over 30 Minutes Intravenous On call to O.R. 03/15/20 1920 03/17/20 0020   03/12/20 1215  ceFAZolin (ANCEF) IVPB 2g/100 mL premix        2 g 200 mL/hr over 30 Minutes Intravenous On call to O.R. 03/12/20 1026 03/12/20 1833   03/11/20 2345  Ampicillin-Sulbactam (UNASYN) 3 g in sodium chloride 0.9 % 100 mL IVPB        3 g 200 mL/hr over 30 Minutes Intravenous Every 6 hours 03/11/20 2344 03/18/20 2359      Assessment/Plan: PHBC  TBI/F ICC/SDH/SAH/pneumocephalus- per Dr. Maisie Fus, completed Unasyn, Keppra, F/U CT H 7/15 expected enlargement ICCs, F/U CT H 7/17 no signif change, exam stablevs improving?, therapies R orbit FX/R maxillary sinus FX/skull base FXs- per Dr. Uvaldo Rising R humeral head FX dislocation/R clavicle FX/R coracoid FX- relocated in ED by Dr. August Saucer, reconstruction by Dr. August Saucer 7/20. Cleared for increased ROM by Dr. August Saucer today 8/18. L distal femur FX- S/P ORIF by Dr. Carola Frost 7/15 L  bimalleolar ankle FX- S/P ORIF by Dr. Carola Frost 7/15. CAM walker with getting up, NWB LLE Pelvic ring FX- S/P B SI pinning by Dr. Carola Frost 7/15.WBAT RLEfor transfers only L glenoid and scapula FXs- per Dr. Carola Frost R radial shaft FX- S/P ORIF by Dr. Carola Frost 7/15 L rib FX 2-4- pain control, pulm toilet ABL anemia History of +COVIDupon admission- not symptomatic, off  precautions per IP  FEN-regular diet VTE- SCDs, LMWH ID -Afeb, off ABX  Dispo-4NP,TBI team therapies, TOC team working on SNF placement  LOS: 35 days    Violeta Gelinas, MD, MPH, FACS Trauma & General Surgery Use [email or web address] to contact on call provider  04/15/2020
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
## 2020-04-15 NOTE — Progress Notes (Signed)
PT Cancellation Note  Patient Details Name: Anthony Macias MRN: [id number] DOB: 1981-04-02   Cancelled Treatment:    Reason Eval/Treat Not Completed: Patient declined, no reason specified. Pt reports not wanting to move as he just got comfortable. Pt then refuses exercise of RUE, reporting he doesn't feel comfortable to move his R arm despite PT education that the doctor has cleared him for progressive exercise. Pt is agreeable to PT returning tomorrow.   Arlyss Gandy 04/15/2020, 4:26 PM
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
## 2020-04-16 ENCOUNTER — Inpatient Hospital Stay (HOSPITAL_COMMUNITY): Payer: 59

## 2020-04-16 NOTE — TOC Progression Note (Signed)
Transition of Care [facility]) - Progression Note    Patient Details  Name: Anthony Macias MRN: [id number] Date of Birth: 1981/01/13  Transition of Care [facility] CM/SW Contact  Astrid Drafts Berna Spare, RN Phone Number: 04/16/2020, 4:37 PM  Clinical Narrative:   Left message for Lafayette Dragon, admissions director at [facility] skilled nursing facility, requesting update on potential admission.  Also left message for Leanora Cover, director of admissions and marketing at [facility], requesting facility to review patient again for potential admission.  Will provide updates as they are available.    Expected Discharge Plan: Skilled Nursing Facility Barriers to Discharge: Continued Medical Work up, Inadequate or no insurance  Expected Discharge Plan and Services Expected Discharge Plan: Skilled Nursing Facility   Discharge Planning Services: CM Consult   Living arrangements for the past 2 months: Homeless                                       Social Determinants of Health (SDOH) Interventions    Readmission Risk Interventions No flowsheet data found.  Quintella Baton, RN, BSN  Trauma/Neuro ICU Case Manager [phone]
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
## 2020-04-16 NOTE — Progress Notes (Signed)
31 Days Post-Op  Subjective: No new complaints.  Just had a bunch of x-rays of all of his ortho injuries for follow up  ROS: See above, otherwise other systems negative  Objective: Vital signs in last 24 hours: Temp:  [98.5 F (36.9 C)-99.2 F (37.3 C)] 98.5 F (36.9 C) (08/19 0438) Pulse Rate:  [82-101] 82 (08/19 0438) Resp:  [14-20] 15 (08/19 0438) BP: (108-122)/(76-90) 108/89 (08/19 0438) SpO2:  [90 %-100 %] 90 % (08/19 0438) Weight:  [66.2 kg] 66.2 kg (08/19 0500) Last BM Date: 04/13/20  Intake/Output from previous day: 08/18 0701 - 08/19 0700 In: 460 [P.O.:450; I.V.:10] Out: 750 [Urine:750] Intake/Output this shift: No intake/output data recorded.  PE: General appearance: cooperative Resp: clear to auscultation bilaterally Cardio: regular rate and rhythm GI: soft, NT Extremities: LLE brace Neuro: arouses and [name] well, less confused, oriented to place, self  Lab Results:  No results for input(s): WBC, HGB, HCT, PLT in the last 72 hours. BMET No results for input(s): NA, K, CL, CO2, GLUCOSE, BUN, CREATININE, CALCIUM in the last 72 hours. PT/INR No results for input(s): LABPROT, INR in the last 72 hours. CMP     Component Value Date/Time   NA 143 04/06/2020 0600   K 4.5 04/06/2020 0600   CL 103 04/06/2020 0600   CO2 28 04/06/2020 0600   GLUCOSE 114 (H) 04/06/2020 0600   BUN 24 (H) 04/06/2020 0600   CREATININE 0.75 04/06/2020 0600   CALCIUM 10.0 04/06/2020 0600   PROT 6.0 (L) 03/12/2020 0126   ALBUMIN 3.6 03/12/2020 0126   AST 228 (H) 03/12/2020 0126   ALT 96 (H) 03/12/2020 0126   ALKPHOS 51 03/12/2020 0126   BILITOT 0.6 03/12/2020 0126   GFRNONAA >60 04/06/2020 0600   GFRAA >60 04/06/2020 0600   Lipase  No results found for: LIPASE     Studies/Results: DG Shoulder Right Port  Result Date: 04/14/2020 CLINICAL DATA:  Nondisplaced fracture of coracoid process. EXAM: PORTABLE RIGHT SHOULDER COMPARISON:  Shoulder radiograph 03/12/2020, MRI  03/15/2020. FINDINGS: Screw fixation of coracoid process fracture, fracture line not well seen by radiograph. Increasing heterotopic calcification and peripheral callus formation about the greater tuberosity fracture. Callus formation about the inferior glenoid and at site of fracture on MRI, with likely calcifications along the inferior joint capsule. No dislocation. IMPRESSION: 1. Screw fixation of coracoid process fracture, fracture line not well seen by radiograph. 2. Increasing heterotopic calcification and peripheral callus formation about the greater tuberosity fracture. 3. Developing callus/heterotopic calcification about the inferior glenoid and likely inferior joint capsule. Electronically Signed   By: Narda Rutherford M.D.   On: 04/14/2020 19:29    Anti-infectives: Anti-infectives (From admission, onward)   Start     Dose/Rate Route Frequency Ordered Stop   03/16/20 2049  vancomycin (VANCOCIN) powder  Status:  Discontinued          As needed 03/16/20 2049 03/16/20 2357   03/16/20 0600  ceFAZolin (ANCEF) IVPB 2g/100 mL premix  Status:  Discontinued        2 g 200 mL/hr over 30 Minutes Intravenous On call to O.R. 03/15/20 1920 03/17/20 0020   03/12/20 1215  ceFAZolin (ANCEF) IVPB 2g/100 mL premix        2 g 200 mL/hr over 30 Minutes Intravenous On call to O.R. 03/12/20 1026 03/12/20 1833   03/11/20 2345  Ampicillin-Sulbactam (UNASYN) 3 g in sodium chloride 0.9 % 100 mL IVPB        3 g 200  mL/hr over 30 Minutes Intravenous Every 6 hours 03/11/20 2344 03/18/20 2359       Assessment/Plan PHBC  TBI/F ICC/SDH/SAH/pneumocephalus- per Dr. Maisie Fus, completed Unasyn, Keppra, F/U CT H 7/15 expected enlargement ICCs, F/U CT H 7/17 no signif change, exam stablevs improving?, therapies R orbit FX/R maxillary sinus FX/skull base FXs- per Dr. Uvaldo Rising R humeral head FX dislocation/R clavicle FX/R coracoid FX- relocated in ED by Dr. August Saucer, reconstruction by Dr. August Saucer 7/20. Cleared for increased  ROM by Dr. August Saucer today 8/18. L distal femur FX- S/P ORIF by Dr. Carola Frost 7/15, repeat films pending L bimalleolar ankle FX- S/P ORIF by Dr. Carola Frost 7/15. CAM walker with getting up, NWB LLE, repeat films pending Pelvic ring FX- S/P B SI pinning by Dr. Carola Frost 7/15.WBAT RLEfor transfers only, repeat films pending L glenoid and scapula FXs- per Dr. Carola Frost R radial shaft FX- S/P ORIF by Dr. Carola Frost 7/15 L rib FX 2-4- pain control, pulm toilet ABL anemia History of +COVIDupon admission- not symptomatic, off precautions per IP  FEN-regular diet VTE- SCDs, LMWH ID -Afeb, off ABX  Dispo-4NP,TBI team therapies, TOC team working on SNF placement   LOS: 36 days    [name] , [facility] 04/16/2020, 10:44 AM Please see Amion for pager number during day hours 7:00am-4:30pm or 7:00am -11:30am on weekends
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
## 2020-04-19 NOTE — Progress Notes (Signed)
Patient ID: Anthony Macias, male   DOB: 02/21/1981, 39 y.o.   MRN: [id number] 34 Days Post-Op   Subjective: Ate this AM No complaints  ROS negative except as listed above. Objective: Vital signs in last 24 hours: Temp:  [98 F (36.7 C)-98.7 F (37.1 C)] 98 F (36.7 C) (08/22 0428) Pulse Rate:  [74-85] 77 (08/22 0428) Resp:  [18-20] 18 (08/22 0428) BP: (106-123)/(75-90) 123/75 (08/22 0428) SpO2:  [97 %-100 %] 100 % (08/22 0428) Last BM Date: 04/13/20  Intake/Output from previous day: 08/21 0701 - 08/22 0700 In: 0  Out: 400 [Urine:400] Intake/Output this shift: No intake/output data recorded.  General appearance: appears stated age Resp: clear to auscultation bilaterally Cardio: regular rate and rhythm GI: soft, NT, ND Extremities: boot LLE Neurologic: Mental status: alert and [name]  Lab Results: CBC  No results for input(s): WBC, HGB, HCT, PLT in the last 72 hours. BMET No results for input(s): NA, K, CL, CO2, GLUCOSE, BUN, CREATININE, CALCIUM in the last 72 hours. PT/INR No results for input(s): LABPROT, INR in the last 72 hours. ABG No results for input(s): PHART, HCO3 in the last 72 hours.  Invalid input(s): PCO2, PO2  Studies/Results: No results found.  Anti-infectives: Anti-infectives (From admission, onward)   Start     Dose/Rate Route Frequency Ordered Stop   03/16/20 2049  vancomycin (VANCOCIN) powder  Status:  Discontinued          As needed 03/16/20 2049 03/16/20 2357   03/16/20 0600  ceFAZolin (ANCEF) IVPB 2g/100 mL premix  Status:  Discontinued        2 g 200 mL/hr over 30 Minutes Intravenous On call to O.R. 03/15/20 1920 03/17/20 0020   03/12/20 1215  ceFAZolin (ANCEF) IVPB 2g/100 mL premix        2 g 200 mL/hr over 30 Minutes Intravenous On call to O.R. 03/12/20 1026 03/12/20 1833   03/11/20 2345  Ampicillin-Sulbactam (UNASYN) 3 g in sodium chloride 0.9 % 100 mL IVPB        3 g 200 mL/hr over 30 Minutes Intravenous Every 6 hours 03/11/20  2344 03/18/20 2359      Assessment/Plan: PHBC  TBI/F ICC/SDH/SAH/pneumocephalus- per Dr. Maisie Fus, completed Unasyn, Keppra, F/U CT H 7/15 expected enlargement ICCs, F/U CT H 7/17 no signif change, exam stablevs improving?, therapies R orbit FX/R maxillary sinus FX/skull base FXs- per Dr. Uvaldo Rising R humeral head FX dislocation/R clavicle FX/R coracoid FX- relocated in ED by Dr. August Saucer, reconstruction by Dr. August Saucer 7/20. Cleared for increased ROM by Dr. August Saucer 8/18. L distal femur FX- S/P ORIF by Dr. Carola Frost 7/15, F/U films 8/19 L bimalleolar ankle FX- S/P ORIF by Dr. Carola Frost 7/15. CAM walker with getting up, NWB LLE, repeat films 8/19 Pelvic ring FX- S/P B SI pinning by Dr. Carola Frost 7/15.WBAT RLEfor transfers only, repeat films 8/19 L glenoid and scapula FXs- per Dr. Carola Frost R radial shaft FX- S/P ORIF by Dr. Carola Frost 7/15 L rib FX 2-4- pain control, pulm toilet ABL anemia History of +COVIDupon admission- not symptomatic, off precautions per IP  FEN-regular diet VTE- SCDs, LMWH ID -Afeb, off ABX  Dispo-4NP,TBI team therapies, TOC team working on insurance approval/SNF placement   LOS: 39 days   Marin Olp, M.D. [facility] Use [email or web address] to contact on call provider  04/19/2020

## 2020-04-20 NOTE — Progress Notes (Deleted)
OT Cancellation Note  Patient Details Name: Anthony Macias MRN: [id number] DOB: [birth year]   Cancelled Treatment:    Reason Eval/Treat Not Completed: Other (comment) Pt agitated with eyes closed growling, yelling, and swatting at nursing staff upon entry into the room. RN requesting therapy hold for the time being in order for pt to regulate prior to attempting therapy. Will check back as time allows.   Pollyann Glen E. Jayleen Afonso, COTA/L Acute Rehabilitation Services [phone]  Cherlyn Cushing 04/20/2020, 10:58 AM

## 2020-04-20 NOTE — Progress Notes (Signed)
Pt has become increasingly annoyed with staff over the past couple of days. Today when this nurse would enter room pt would close eyes and not respond to questions. Pt did eat meals and respond to NT on the floor. Pt refused all meds including lovenox. This afternoon when pt again tried to refuse meds it was explaine if he does not take the lovenox it could result in a blood clot. Pt was still resistant but ultimately allowed this nurse to administer the medication. It was reported that this was given very late in the shift (at pharmacy's suggestion) and to postpone giving HS lovenox. Mayford Knife RN

## 2020-04-20 NOTE — Progress Notes (Signed)
PT Cancellation Note  Patient Details Name: Anthony Macias MRN: [id number] DOB: 1980-12-23   Cancelled Treatment:    Reason Eval/Treat Not Completed: Patient declined, no reason specified.  "my mind is not right now. I have to get my mind right". PT spent 20 minutes attempting to convince pt to mobilize in any way he would be agreeable and he continued to report he needed to sleep and rest right now and was not ready.  I left lights fully on and blinds open as I am not sure if he is starting to get his days and nights confused from sleeping all day.    PT will check back tomorrow.   Thanks,  Corinna Capra, PT, DPT  Acute Rehabilitation [phone] pager #[phone] office       Rollene Rotunda Kanitra Purifoy 04/20/2020, 5:09 PM

## 2020-04-20 NOTE — Progress Notes (Signed)
Patient ID: Anthony Macias, male   DOB: July 08, 1981, 39 y.o.   MRN: [id number]    35 Days Post-Op  Subjective: No complaints.  "it's early in the morning" is his response when I ask him how he is doing.  Knows he's in the hospital but says he doesn't keep up with the date  ROS: See above, otherwise other systems negative  Objective: Vital signs in last 24 hours: Temp:  [98.1 F (36.7 C)-98.7 F (37.1 C)] 98.6 F (37 C) (08/23 0753) Pulse Rate:  [76-88] 76 (08/23 0753) Resp:  [13-20] 13 (08/23 0753) BP: (103-117)/(65-80) 117/78 (08/23 0753) SpO2:  [98 %-100 %] 98 % (08/23 0753) Last BM Date: 04/13/20  Intake/Output from previous day: No intake/output data recorded. Intake/Output this shift: No intake/output data recorded.  PE: Gen: NAD Heart: regular Lungs: CTAB Abd: soft, NT, ND, +BS Ext: nothing on UEs.  Boot in place on LLE Psych: Alert and oriented to self and place  Lab Results:  No results for input(s): WBC, HGB, HCT, PLT in the last 72 hours. BMET No results for input(s): NA, K, CL, CO2, GLUCOSE, BUN, CREATININE, CALCIUM in the last 72 hours. PT/INR No results for input(s): LABPROT, INR in the last 72 hours. CMP     Component Value Date/Time   NA 143 04/06/2020 0600   K 4.5 04/06/2020 0600   CL 103 04/06/2020 0600   CO2 28 04/06/2020 0600   GLUCOSE 114 (H) 04/06/2020 0600   BUN 24 (H) 04/06/2020 0600   CREATININE 0.75 04/06/2020 0600   CALCIUM 10.0 04/06/2020 0600   PROT 6.0 (L) 03/12/2020 0126   ALBUMIN 3.6 03/12/2020 0126   AST 228 (H) 03/12/2020 0126   ALT 96 (H) 03/12/2020 0126   ALKPHOS 51 03/12/2020 0126   BILITOT 0.6 03/12/2020 0126   GFRNONAA >60 04/06/2020 0600   GFRAA >60 04/06/2020 0600   Lipase  No results found for: LIPASE     Studies/Results: No results found.  Anti-infectives: Anti-infectives (From admission, onward)   Start     Dose/Rate Route Frequency Ordered Stop   03/16/20 2049  vancomycin (VANCOCIN) powder  Status:   Discontinued          As needed 03/16/20 2049 03/16/20 2357   03/16/20 0600  ceFAZolin (ANCEF) IVPB 2g/100 mL premix  Status:  Discontinued        2 g 200 mL/hr over 30 Minutes Intravenous On call to O.R. 03/15/20 1920 03/17/20 0020   03/12/20 1215  ceFAZolin (ANCEF) IVPB 2g/100 mL premix        2 g 200 mL/hr over 30 Minutes Intravenous On call to O.R. 03/12/20 1026 03/12/20 1833   03/11/20 2345  Ampicillin-Sulbactam (UNASYN) 3 g in sodium chloride 0.9 % 100 mL IVPB        3 g 200 mL/hr over 30 Minutes Intravenous Every 6 hours 03/11/20 2344 03/18/20 2359       Assessment/Plan PHBC  TBI/F ICC/SDH/SAH/pneumocephalus- per Dr. Maisie Fus, completed Unasyn, Keppra, F/U CT H 7/15 expected enlargement ICCs, F/U CT H 7/17 no signif change, exam stablevs improving?, therapies R orbit FX/R maxillary sinus FX/skull base FXs- per Dr. Uvaldo Rising R humeral head FX dislocation/R clavicle FX/R coracoid FX- relocated in ED by Dr. August Saucer, reconstruction by Dr. August Saucer 7/20.Cleared for increased ROM by Dr. August Saucer 8/18. L distal femur FX- S/P ORIF by Dr. Carola Frost 7/15, F/U films 8/19 L bimalleolar ankle FX- S/P ORIF by Dr. Carola Frost 7/15. CAM walker with getting up, NWB LLE,  repeat films 8/19 Pelvic ring FX- S/P B SI pinning by Dr. Carola Frost 7/15.WBAT RLEfor transfers only, repeat films 8/19 L glenoid and scapula FXs- per Dr. Carola Frost R radial shaft FX- S/P ORIF by Dr. Carola Frost 7/15 L rib FX 2-4- pain control, pulm toilet ABL anemia History of +COVIDupon admission- not symptomatic, off precautions per IP  FEN-regular diet VTE- SCDs, LMWH ID -Afeb, off ABX  Dispo-4NP,TBI team therapies, TOC team working on insurance approval/SNF placement   LOS: 40 days    [name] , [facility] 04/20/2020, 8:03 AM Please see Amion for pager number during day hours 7:00am-4:30pm or 7:00am -11:30am on weekends
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
## 2020-04-21 NOTE — Progress Notes (Signed)
Nutrition Follow-up  DOCUMENTATION CODES:   Not applicable  INTERVENTION:  ContinueEnsure Enlive poTID, each supplement provides 350 kcal and 20 grams of protein  Continue30 ml Prosource pluspoTID, each supplement provides 100 kcal and 15 grams of protein.  Encourage adequate PO intake.  NUTRITION DIAGNOSIS:   Increased nutrient needs related to wound healing as evidenced by estimated needs; ongoing  GOAL:   Patient will meet greater than or equal to 90% of their needs; met  MONITOR:   PO intake, Supplement acceptance, Skin, Weight trends, Labs, I & O's  REASON FOR ASSESSMENT:   Consult, Ventilator Enteral/tube feeding initiation and management  ASSESSMENT:   [facility] admitted with TBI, SDH, SAH, pneumocephalus, ICC, R orbit fx, R maxillary sinus fx, skull base fxs, R humeral head fx dislocation, R clavicle fx, R coracoid fx, L distal femur fx s/p ORIF 7/15, L ankle fx s/p ORIF 7/15, pelvic ring fx s/p pinning 7/15, L scapula fxs, R radial shaft fx s/p ORIF 7/15, and L rib fx 2-4. Pt covid + on admission. 7/23- extubated  Meal completion has been 75-100%. Pt reports appetite has been fine with no abdominal discomfort. Pt currently has Ensure and Prosource plus ordered and has been consuming them. RD to continue with current orders to aid in increased caloric and protein needs.    Labs and medications reviewed.   Diet Order:   Diet Order            Diet regular Room service appropriate? Yes; Fluid consistency: Thin  Diet effective now                 EDUCATION NEEDS:   No education needs have been identified at this time  Skin:  Skin Assessment: Skin Integrity Issues: Skin Integrity Issues:: Incisions Incisions: R arm, L leg, L thigh, L hip Other: N/A  Last BM:  8/23  Height:   Ht Readings from Last 1 Encounters:  03/22/20 _0  (1.803 m)    Weight:   Wt Readings from Last 1 Encounters:  04/21/20 66.8 kg    BMI:  Body mass index is 20.54  kg/m.  Estimated Nutritional Needs:   Kcal:  2200-2400  Protein:  135-160 grams  Fluid:  2 L/day  Corrin Parker, MS, RD, LDN RD pager number/after hours weekend pager number on Amion.

## 2020-04-21 NOTE — Plan of Care (Signed)

## 2020-04-21 NOTE — Progress Notes (Signed)
Patient has [employer] insurance- DCP SNF; Kalman Jewels Supervisor with [facility] [phone] ext [phone]) called to escalate his disposition. Jiles Crocker RN,MHA,BSN Transition of Care Supervisor [phone]

## 2020-04-21 NOTE — Progress Notes (Signed)
Patient ID: Anthony Macias, male   DOB: Jan 01, 1981, 39 y.o.   MRN: [id number] 36 Days Post-Op   Subjective: Up in chair, working with therapies  ROS negative except as listed above. Objective: Vital signs in last 24 hours: Temp:  [98.4 F (36.9 C)-99 F (37.2 C)] 98.7 F (37.1 C) (08/24 0719) Pulse Rate:  [65-78] 70 (08/24 0719) Resp:  [14-20] 14 (08/24 0719) BP: (100-135)/(66-98) 105/67 (08/24 0719) SpO2:  [94 %-100 %] 98 % (08/24 0719) Weight:  [66.8 kg] 66.8 kg (08/24 0500) Last BM Date: 04/20/20  Intake/Output from previous day: 08/23 0701 - 08/24 0700 In: 1030 [P.O.:1020; I.V.:10] Out: 1750 [Urine:1750] Intake/Output this shift: No intake/output data recorded.  General appearance: alert and cooperative Resp: clear to auscultation bilaterally Cardio: regular rate and rhythm GI: soft, NT Extremities: boot LLE, does not flex LLE at knee much Neurologic: Mental status: alert and [name]  Lab Results: CBC  No results for input(s): WBC, HGB, HCT, PLT in the last 72 hours. BMET No results for input(s): NA, K, CL, CO2, GLUCOSE, BUN, CREATININE, CALCIUM in the last 72 hours. PT/INR No results for input(s): LABPROT, INR in the last 72 hours. ABG No results for input(s): PHART, HCO3 in the last 72 hours.  Invalid input(s): PCO2, PO2  Studies/Results: No results found.  Anti-infectives: Anti-infectives (From admission, onward)   Start     Dose/Rate Route Frequency Ordered Stop   03/16/20 2049  vancomycin (VANCOCIN) powder  Status:  Discontinued          As needed 03/16/20 2049 03/16/20 2357   03/16/20 0600  ceFAZolin (ANCEF) IVPB 2g/100 mL premix  Status:  Discontinued        2 g 200 mL/hr over 30 Minutes Intravenous On call to O.R. 03/15/20 1920 03/17/20 0020   03/12/20 1215  ceFAZolin (ANCEF) IVPB 2g/100 mL premix        2 g 200 mL/hr over 30 Minutes Intravenous On call to O.R. 03/12/20 1026 03/12/20 1833   03/11/20 2345  Ampicillin-Sulbactam (UNASYN) 3 g in  sodium chloride 0.9 % 100 mL IVPB        3 g 200 mL/hr over 30 Minutes Intravenous Every 6 hours 03/11/20 2344 03/18/20 2359      Assessment/Plan: PHBC  TBI/F ICC/SDH/SAH/pneumocephalus- per Dr. Maisie Macias, completed Unasyn, Keppra, F/U CT H 7/15 expected enlargement ICCs, F/U CT H 7/17 no signif change, exam stablevs improving?, therapies R orbit FX/R maxillary sinus FX/skull base FXs- per Dr. Uvaldo Macias R humeral head FX dislocation/R clavicle FX/R coracoid FX- relocated in ED by Dr. August Macias, reconstruction by Dr. August Macias 7/20.Cleared for increased ROM by Dr. August Macias 8/18. L distal femur FX- S/P ORIF by Dr. Carola Macias 7/15, F/U films 8/19 L bimalleolar ankle FX- S/P ORIF by Dr. Carola Macias 7/15. CAM walker with getting up, NWB LLE, repeat films 8/19 Pelvic ring FX- S/P B SI pinning by Dr. Carola Macias 7/15.WBAT RLEfor transfers only, repeat films 8/19 L glenoid and scapula FXs- per Dr. Carola Macias R radial shaft FX- S/P ORIF by Dr. Carola Macias 7/15 L rib FX 2-4- pain control, pulm toilet ABL anemia History of +COVIDupon admission- not symptomatic, off precautions per IP  FEN-regular diet VTE- SCDs, LMWH ID -Afeb, off ABX  Dispo-4NP,has progressed with TBI team therapies, TOC team working on insurance approval/SNF placement  LOS: 41 days    Anthony Gelinas, MD, MPH, FACS Trauma & General Surgery Use [email or web address] to contact on call provider  04/21/2020
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
## 2020-04-22 NOTE — Progress Notes (Signed)
PIV consult: No current IV meds noted. Pt removing IVs.  Coordinated with RN, nurse will enter STAT consult if IV meds are needed.

## 2020-04-22 NOTE — Progress Notes (Signed)
37 Days Post-Op  Subjective: No new complaints.  Sleeping mostly   ROS: See above, otherwise other systems negative  Objective: Vital signs in last 24 hours: Temp:  [98.5 F (36.9 C)-98.9 F (37.2 C)] 98.8 F (37.1 C) (08/25 0743) Pulse Rate:  [71-98] 73 (08/25 0743) Resp:  [15-18] 18 (08/25 0743) BP: (105-119)/(69-72) 119/71 (08/25 0743) SpO2:  [100 %] 100 % (08/25 0743) Last BM Date: 04/22/20  Intake/Output from previous day: 08/24 0701 - 08/25 0700 In: 790 [P.O.:780; I.V.:10] Out: -  Intake/Output this shift: Total I/O In: -  Out: 350 [Urine:350]  PE: Gen: NAD Heart: regular Lungs: CTAB Abd: soft Ext: MAE  Lab Results:  No results for input(s): WBC, HGB, HCT, PLT in the last 72 hours. BMET No results for input(s): NA, K, CL, CO2, GLUCOSE, BUN, CREATININE, CALCIUM in the last 72 hours. PT/INR No results for input(s): LABPROT, INR in the last 72 hours. CMP     Component Value Date/Time   NA 143 04/06/2020 0600   K 4.5 04/06/2020 0600   CL 103 04/06/2020 0600   CO2 28 04/06/2020 0600   GLUCOSE 114 (H) 04/06/2020 0600   BUN 24 (H) 04/06/2020 0600   CREATININE 0.75 04/06/2020 0600   CALCIUM 10.0 04/06/2020 0600   PROT 6.0 (L) 03/12/2020 0126   ALBUMIN 3.6 03/12/2020 0126   AST 228 (H) 03/12/2020 0126   ALT 96 (H) 03/12/2020 0126   ALKPHOS 51 03/12/2020 0126   BILITOT 0.6 03/12/2020 0126   GFRNONAA >60 04/06/2020 0600   GFRAA >60 04/06/2020 0600   Lipase  No results found for: LIPASE     Studies/Results: No results found.  Anti-infectives: Anti-infectives (From admission, onward)   Start     Dose/Rate Route Frequency Ordered Stop   03/16/20 2049  vancomycin (VANCOCIN) powder  Status:  Discontinued          As needed 03/16/20 2049 03/16/20 2357   03/16/20 0600  ceFAZolin (ANCEF) IVPB 2g/100 mL premix  Status:  Discontinued        2 g 200 mL/hr over 30 Minutes Intravenous On call to O.R. 03/15/20 1920 03/17/20 0020   03/12/20 1215   ceFAZolin (ANCEF) IVPB 2g/100 mL premix        2 g 200 mL/hr over 30 Minutes Intravenous On call to O.R. 03/12/20 1026 03/12/20 1833   03/11/20 2345  Ampicillin-Sulbactam (UNASYN) 3 g in sodium chloride 0.9 % 100 mL IVPB        3 g 200 mL/hr over 30 Minutes Intravenous Every 6 hours 03/11/20 2344 03/18/20 2359       Assessment/Plan PHBC  TBI/F ICC/SDH/SAH/pneumocephalus- per Dr. Maisie Fus, completed Unasyn, Keppra, F/U CT H 7/15 expected enlargement ICCs, F/U CT H 7/17 no signif change, exam stablevs improving?, therapies R orbit FX/R maxillary sinus FX/skull base FXs- per Dr. Uvaldo Rising R humeral head FX dislocation/R clavicle FX/R coracoid FX- relocated in ED by Dr. August Saucer, reconstruction by Dr. August Saucer 7/20.Cleared for increased ROM by Dr. August Saucer 8/18. L distal femur FX- S/P ORIF by Dr. Carola Frost 7/15, F/U films 8/19 L bimalleolar ankle FX- S/P ORIF by Dr. Carola Frost 7/15. CAM walker with getting up, NWB LLE, repeat films 8/19 Pelvic ring FX- S/P B SI pinning by Dr. Carola Frost 7/15.WBAT RLEfor transfers only, repeat films 8/19 L glenoid and scapula FXs- per Dr. Carola Frost R radial shaft FX- S/P ORIF by Dr. Carola Frost 7/15 L rib FX 2-4- pain control, pulm toilet ABL anemia History of +COVIDupon admission-  not symptomatic, off precautions per IP  FEN-regular diet VTE- SCDs, LMWH ID -Afeb, off ABX  Dispo-4NP,has progressed with TBI team therapies, TOC team working on insurance approval/SNF placement   LOS: 42 days    [name] , [facility] Surgery 04/22/2020, 11:14 AM Please see Amion for pager number during day hours 7:00am-4:30pm or 7:00am -11:30am on weekends
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
## 2020-04-23 NOTE — Progress Notes (Signed)
[facility] Progress Note  38 Days Post-Op  Subjective: CC-  Sitting up in bed eating grapes and watching TV. No complaints. Worked with OT yesterday.  Objective: Vital signs in last 24 hours: Temp:  [98.3 F (36.8 C)-99.3 F (37.4 C)] 98.4 F (36.9 C) (08/26 0748) Pulse Rate:  [82-84] 82 (08/26 0748) Resp:  [18-20] 18 (08/26 0748) BP: (103-119)/(69-85) 119/85 (08/26 0748) SpO2:  [99 %-100 %] 100 % (08/26 0748) Last BM Date: 04/22/20  Intake/Output from previous day: 08/25 0701 - 08/26 0700 In: 600 [P.O.:600] Out: 1470 [Urine:1470] Intake/Output this shift: No intake/output data recorded.  PE: Gen:  Alert, NAD, pleasant, cooperative Card:  RRR Pulm:  CTAB, no W/R/R, rate and effort normal Abd: Soft, NT/ND, +BS Ext:  Boot LLE Psych: Alert and oriented to self, states he is in the hospital, unsure of the year. Follows commands Skin: warm and dry  Lab Results:  No results for input(s): WBC, HGB, HCT, PLT in the last 72 hours. BMET No results for input(s): NA, K, CL, CO2, GLUCOSE, BUN, CREATININE, CALCIUM in the last 72 hours. PT/INR No results for input(s): LABPROT, INR in the last 72 hours. CMP     Component Value Date/Time   NA 143 04/06/2020 0600   K 4.5 04/06/2020 0600   CL 103 04/06/2020 0600   CO2 28 04/06/2020 0600   GLUCOSE 114 (H) 04/06/2020 0600   BUN 24 (H) 04/06/2020 0600   CREATININE 0.75 04/06/2020 0600   CALCIUM 10.0 04/06/2020 0600   PROT 6.0 (L) 03/12/2020 0126   ALBUMIN 3.6 03/12/2020 0126   AST 228 (H) 03/12/2020 0126   ALT 96 (H) 03/12/2020 0126   ALKPHOS 51 03/12/2020 0126   BILITOT 0.6 03/12/2020 0126   GFRNONAA >60 04/06/2020 0600   GFRAA >60 04/06/2020 0600   Lipase  No results found for: LIPASE     Studies/Results: No results found.  Anti-infectives: Anti-infectives (From admission, onward)   Start     Dose/Rate Route Frequency Ordered Stop   03/16/20 2049  vancomycin (VANCOCIN) powder  Status:  Discontinued           As needed 03/16/20 2049 03/16/20 2357   03/16/20 0600  ceFAZolin (ANCEF) IVPB 2g/100 mL premix  Status:  Discontinued        2 g 200 mL/hr over 30 Minutes Intravenous On call to O.R. 03/15/20 1920 03/17/20 0020   03/12/20 1215  ceFAZolin (ANCEF) IVPB 2g/100 mL premix        2 g 200 mL/hr over 30 Minutes Intravenous On call to O.R. 03/12/20 1026 03/12/20 1833   03/11/20 2345  Ampicillin-Sulbactam (UNASYN) 3 g in sodium chloride 0.9 % 100 mL IVPB        3 g 200 mL/hr over 30 Minutes Intravenous Every 6 hours 03/11/20 2344 03/18/20 2359       Assessment/Plan PHBC  TBI/F ICC/SDH/SAH/pneumocephalus- per Dr. Maisie Fus, completed Unasyn, Keppra, F/U CT H 7/15 expected enlargement ICCs, F/U CT H 7/17 no signif change, exam stablevs improving?, therapies R orbit FX/R maxillary sinus FX/skull base FXs- per Dr. Uvaldo Rising R humeral head FX dislocation/R clavicle FX/R coracoid FX- relocated in ED by Dr. August Saucer, reconstruction by Dr. August Saucer 7/20.NWB RUE, Cleared for increased ROM by Dr. August Saucer 8/18. L distal femur FX- S/P ORIF by Dr. Carola Frost 7/15, F/U films 8/19 L bimalleolar ankle FX- S/P ORIF by Dr. Carola Frost 7/15. CAM walker with getting up, NWB LLE, repeat films 8/19 Pelvic ring FX- S/P B SI pinning  by Dr. Carola Frost 7/15.WBAT RLEfor transfers only, repeat films 8/19 L glenoid and scapula FXs- per Dr. Carola Frost R radial shaft FX- S/P ORIF by Dr. Carola Frost 7/15 L rib FX 2-4- pain control, pulm toilet ABL anemia History of +COVIDupon admission- not symptomatic, off precautions per IP  FEN-regular diet VTE- SCDs, LMWH ID -Afeb, off ABX  Dispo-4NP,working with TBI team therapies, TOC team working on insurance approval/SNF placement   LOS: 43 days    Franne Forts, PA-C [facility] Surgery 04/23/2020, 8:52 AM Please see Amion for pager number during day hours 7:00am-4:30pm
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
## 2020-04-24 NOTE — Progress Notes (Signed)
   Trauma/Critical Care Follow Up Note  Subjective:    Overnight Issues:   Objective:  Vital signs for last 24 hours: Temp:  [98 F (36.7 C)-98.9 F (37.2 C)] 98.9 F (37.2 C) (08/27 0730) Pulse Rate:  [69-79] 69 (08/27 0730) Resp:  [18-20] 18 (08/27 0730) BP: (100-114)/(68-82) 100/71 (08/27 0730) SpO2:  [99 %-100 %] 99 % (08/27 0730)  Hemodynamic parameters for last 24 hours:    Intake/Output from previous day: 08/26 0701 - 08/27 0700 In: -  Out: 650 [Urine:650]  Intake/Output this shift: No intake/output data recorded.  Vent settings for last 24 hours:    Physical Exam:  Gen: comfortable, no distress Neuro: non-focal exam HEENT: PERRL Neck: supple CV: RRR Pulm: unlabored breathing Abd: soft, NT GU: clear yellow urine Extr: wwp, no edema   No results found for this or any previous visit (from the past 24 hour(s)).  Assessment & Plan: Present on Admission: **None**    LOS: 44 days   Additional comments:None  PHBC  TBI/F ICC/SDH/SAH/pneumocephalus- per Dr. Maisie Fus, completed Unasyn, Keppra, F/U CT H 7/15 expected enlargement ICCs, F/U CT H 7/17 no signif change, exam stablevs improving?, therapies R orbit FX/R maxillary sinus FX/skull base FXs- per Dr. Uvaldo Rising R humeral head FX dislocation/R clavicle FX/R coracoid FX- relocated in ED by Dr. August Saucer, reconstruction by Dr. August Saucer 7/20.NWB RUE, Cleared for increased ROM by Dr. August Saucer 8/18. L distal femur FX- S/P ORIF by Dr. Carola Frost 7/15, F/U films 8/19 L bimalleolar ankle FX- S/P ORIF by Dr. Carola Frost 7/15. CAM walker with getting up, NWB LLE, repeat films 8/19 Pelvic ring FX- S/P B SI pinning by Dr. Carola Frost 7/15.WBAT RLEfor transfers only, repeat films 8/19 L glenoid and scapula FXs- per Dr. Carola Frost R radial shaft FX- S/P ORIF by Dr. Carola Frost 7/15 L rib FX 2-4- pain control, pulm toilet ABL anemia History of +COVIDupon admission- not symptomatic, off precautions per IP FEN-regular diet VTE- SCDs,  [facility] Dispo-4NP,working with TBI team therapies, TOC team working on insurance approval/SNF placement  Diamantina Monks, MD Trauma & General Surgery Please use [email or web address] to contact on call provider  04/24/2020  *Care during the described time interval was provided by me. I have reviewed this patient's available data, including medical history, events of note, physical examination and test results as part of my evaluation.
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
## 2020-04-25 NOTE — Progress Notes (Signed)
40 Days Post-Op   Subjective/Chief Complaint: Following commands, awake watching tv   Objective: Vital signs in last 24 hours: Temp:  [98.1 F (36.7 C)-98.9 F (37.2 C)] 98.1 F (36.7 C) (08/28 0728) Pulse Rate:  [60-77] 60 (08/28 0728) Resp:  [18-20] 20 (08/28 0728) BP: (101-112)/(66-77) 101/71 (08/28 0728) SpO2:  [97 %-100 %] 100 % (08/28 0728) Weight:  [66.8 kg] 66.8 kg (08/28 0500) Last BM Date: 04/24/20  Intake/Output from previous day: 08/27 0701 - 08/28 0700 In: 250 [P.O.:240; I.V.:10] Out: 800 [Urine:800] Intake/Output this shift: No intake/output data recorded.  Gen: comfortable, no distress Neuro: non-focal exam HEENT: PERRL Neck: supple CV: RRR Pulm: clear Abd: soft, NT Extr: lle boot in place, cr < 2 secs, no edema  Lab Results:  No results for input(s): WBC, HGB, HCT, PLT in the last 72 hours. BMET No results for input(s): NA, K, CL, CO2, GLUCOSE, BUN, CREATININE, CALCIUM in the last 72 hours. PT/INR No results for input(s): LABPROT, INR in the last 72 hours. ABG No results for input(s): PHART, HCO3 in the last 72 hours.  Invalid input(s): PCO2, PO2  Studies/Results: No results found.  Anti-infectives: Anti-infectives (From admission, onward)   Start     Dose/Rate Route Frequency Ordered Stop   03/16/20 2049  vancomycin (VANCOCIN) powder  Status:  Discontinued          As needed 03/16/20 2049 03/16/20 2357   03/16/20 0600  ceFAZolin (ANCEF) IVPB 2g/100 mL premix  Status:  Discontinued        2 g 200 mL/hr over 30 Minutes Intravenous On call to O.R. 03/15/20 1920 03/17/20 0020   03/12/20 1215  ceFAZolin (ANCEF) IVPB 2g/100 mL premix        2 g 200 mL/hr over 30 Minutes Intravenous On call to O.R. 03/12/20 1026 03/12/20 1833   03/11/20 2345  Ampicillin-Sulbactam (UNASYN) 3 g in sodium chloride 0.9 % 100 mL IVPB        3 g 200 mL/hr over 30 Minutes Intravenous Every 6 hours 03/11/20 2344 03/18/20 2359      Assessment/Plan: PHBC  TBI/F  ICC/SDH/SAH/pneumocephalus- per Dr. Maisie Fus, completed Unasyn, Keppra, F/U CT H 7/15 expected enlargement ICCs, F/U CT H 7/17 no signif change, therapies R orbit FX/R maxillary sinus FX/skull base FXs- per Dr. Uvaldo Rising R humeral head FX dislocation/R clavicle FX/R coracoid FX- relocated in ED by Dr. August Saucer, reconstruction by Dr. August Saucer 7/20.NWB RUE,Cleared for increased ROM by Dr. August Saucer 8/18. L distal femur FX- S/P ORIF by Dr. Carola Frost 7/15, F/U films 8/19 L bimalleolar ankle FX- S/P ORIF by Dr. Carola Frost 7/15. CAM walker with getting up, NWB LLE, repeat films 8/19 Pelvic ring FX- S/P B SI pinning by Dr. Carola Frost 7/15.WBAT RLEfor transfers only,repeat films 8/19 L glenoid and scapula FXs- per Dr. Carola Frost R radial shaft FX- S/P ORIF by Dr. Carola Frost 7/15 L rib FX 2-4- pain control, pulm toilet ABL anemia History of +COVIDupon admission- not symptomatic, off precautions per IP FEN-regular diet VTE- SCDs, LMWH Dispo-4NP,working withTBI team therapies, TOC team working on insurance approval/SNF placement   Anthony Macias 04/25/2020

## 2020-04-25 NOTE — Progress Notes (Signed)
Pt still resistant to taking medications. Requested this nurse give lovenox in the arm instead of the abd tissue. This nurse educated pt and gave med in abd. Pt tolerated well. Bed in low position with wheels locked and call bell within reach. Mayford Knife RN

## 2020-04-26 NOTE — Progress Notes (Signed)
41 Days Post-Op   Subjective/Chief Complaint: Pt without complaints other than wanting to take a shower.    Objective: Vital signs in last 24 hours: Temp:  [97.8 F (36.6 C)-98.9 F (37.2 C)] 98.6 F (37 C) (08/29 1256) Pulse Rate:  [64-89] 89 (08/29 1256) Resp:  [16-20] 20 (08/29 1256) BP: (105-117)/(69-80) 113/80 (08/29 1256) SpO2:  [96 %-100 %] 100 % (08/29 1256) Weight:  [66.8 kg] 66.8 kg (08/29 0500) Last BM Date: 04/25/20  Intake/Output from previous day: 08/28 0701 - 08/29 0700 In: 410 [P.O.:400; I.V.:10] Out: 650 [Urine:650] Intake/Output this shift: Total I/O In: -  Out: 500 [Urine:500]  Gen: comfortable, no distress Neuro: non-focal exam HEENT: PERRL He[address] rash  Neck: supple CV: RRR Pulm: breathing comfortably Abd: soft, NT Extr: lle boot in place, cr < 2 secs, no edema  Lab Results:  No results for input(s): WBC, HGB, HCT, PLT in the last 72 hours. BMET No results for input(s): NA, K, CL, CO2, GLUCOSE, BUN, CREATININE, CALCIUM in the last 72 hours. PT/INR No results for input(s): LABPROT, INR in the last 72 hours. ABG No results for input(s): PHART, HCO3 in the last 72 hours.  Invalid input(s): PCO2, PO2  Studies/Results: No results found.  Anti-infectives: Anti-infectives (From admission, onward)   Start     Dose/Rate Route Frequency Ordered Stop   03/16/20 2049  vancomycin (VANCOCIN) powder  Status:  Discontinued          As needed 03/16/20 2049 03/16/20 2357   03/16/20 0600  ceFAZolin (ANCEF) IVPB 2g/100 mL premix  Status:  Discontinued        2 g 200 mL/hr over 30 Minutes Intravenous On call to O.R. 03/15/20 1920 03/17/20 0020   03/12/20 1215  ceFAZolin (ANCEF) IVPB 2g/100 mL premix        2 g 200 mL/hr over 30 Minutes Intravenous On call to O.R. 03/12/20 1026 03/12/20 1833   03/11/20 2345  Ampicillin-Sulbactam (UNASYN) 3 g in sodium chloride 0.9 % 100 mL IVPB        3 g 200 mL/hr over 30 Minutes Intravenous Every 6 hours 03/11/20  2344 03/18/20 2359      Assessment/Plan: PHBC  TBI/F ICC/SDH/SAH/pneumocephalus- per Dr. Maisie Fus, completed Unasyn, Keppra, F/U CT H 7/15 expected enlargement ICCs, F/U CT H 7/17 no signif change, therapies R orbit FX/R maxillary sinus FX/skull base FXs- per Dr. Uvaldo Rising R humeral head FX dislocation/R clavicle FX/R coracoid FX- relocated in ED by Dr. August Saucer, reconstruction by Dr. August Saucer 7/20.NWB RUE,Cleared for increased ROM by Dr. August Saucer 8/18. L distal femur FX- S/P ORIF by Dr. Carola Frost 7/15, F/U films 8/19 L bimalleolar ankle FX- S/P ORIF by Dr. Carola Frost 7/15. CAM walker with getting up, NWB LLE, repeat films 8/19 Pelvic ring FX- S/P B SI pinning by Dr. Carola Frost 7/15.WBAT RLEfor transfers only,repeat films 8/19 L glenoid and scapula FXs- per Dr. Carola Frost R radial shaft FX- S/P ORIF by Dr. Carola Frost 7/15 L rib FX 2-4- pain control, pulm toilet ABL anemia History of +COVIDupon admission- not symptomatic, off precautions per ID FEN-regular diet VTE- SCDs, [facility] Dispo-4NP,working withTBI team therapies, TOC team working on insurance approval/SNF placement Will see if ok to shower tomorrow.     Almond Lint 04/26/2020

## 2020-04-26 NOTE — Progress Notes (Signed)
Pt slightly more confused this afternoon. Pt reported he did not order dinner and wanted to call the kitchen. This nurse called the kitchen and confirmed pt did place diner order. PT was still wanted to call them himself so this nurse gave him the room phone and menu and showed him how to call the kitchen. Pt did not call out for this reason after this. Mayford Knife RN
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
## 2020-04-27 NOTE — Progress Notes (Signed)
Patient ID: Anthony Macias, male   DOB: [birth year], 39 y.o.   MRN: [id number] 42 Days Post-Op   Subjective: Reports he got up to [facility] over the weekend. Wants to shower in shower chair  ROS negative except as listed above. Objective: Vital signs in last 24 hours: Temp:  [97.9 F (36.6 C)-98.7 F (37.1 C)] 97.9 F (36.6 C) (08/30 0732) Pulse Rate:  [66-89] 88 (08/30 0732) Resp:  [18-20] 20 (08/30 0732) BP: (106-167)/(73-103) 167/103 (08/30 0732) SpO2:  [98 %-100 %] 100 % (08/30 0732) Last BM Date: 04/26/20  Intake/Output from previous day: 08/29 0701 - 08/30 0700 In: 720 [P.O.:720] Out: 1200 [Urine:1200] Intake/Output this shift: No intake/output data recorded.  General appearance: alert and cooperative Resp: clear to auscultation bilaterally Cardio: regular rate and rhythm GI: benign Extremities: boot LLE Neuro: calm, [name]  Lab Results: CBC  No results for input(s): WBC, HGB, HCT, PLT in the last 72 hours. BMET No results for input(s): NA, K, CL, CO2, GLUCOSE, BUN, CREATININE, CALCIUM in the last 72 hours. PT/INR No results for input(s): LABPROT, INR in the last 72 hours. ABG No results for input(s): PHART, HCO3 in the last 72 hours.  Invalid input(s): PCO2, PO2  Studies/Results: No results found.  Anti-infectives: Anti-infectives (From admission, onward)   Start     Dose/Rate Route Frequency Ordered Stop   03/16/20 2049  vancomycin (VANCOCIN) powder  Status:  Discontinued          As needed 03/16/20 2049 03/16/20 2357   03/16/20 0600  ceFAZolin (ANCEF) IVPB 2g/100 mL premix  Status:  Discontinued        2 g 200 mL/hr over 30 Minutes Intravenous On call to O.R. 03/15/20 1920 03/17/20 0020   03/12/20 1215  ceFAZolin (ANCEF) IVPB 2g/100 mL premix        2 g 200 mL/hr over 30 Minutes Intravenous On call to O.R. 03/12/20 1026 03/12/20 1833   03/11/20 2345  Ampicillin-Sulbactam (UNASYN) 3 g in sodium chloride 0.9 % 100 mL IVPB        3 g 200 mL/hr over 30  Minutes Intravenous Every 6 hours 03/11/20 2344 03/18/20 2359      Assessment/Plan: PHBC  TBI/F ICC/SDH/SAH/pneumocephalus- per Dr. Maisie Fus, completed Unasyn, Keppra, F/U CT H 7/15 expected enlargement ICCs, F/U CT H 7/17 no signif change, therapies R orbit FX/R maxillary sinus FX/skull base FXs- per Dr. Uvaldo Rising R humeral head FX dislocation/R clavicle FX/R coracoid FX- relocated in ED by Dr. August Saucer, reconstruction by Dr. August Saucer 7/20.NWB RUE,Cleared for increased ROM by Dr. August Saucer 8/18. L distal femur FX- S/P ORIF by Dr. Carola Frost 7/15, F/U films 8/19 L bimalleolar ankle FX- S/P ORIF by Dr. Carola Frost 7/15. CAM walker with getting up, NWB LLE, repeat films 8/19 Pelvic ring FX- S/P B SI pinning by Dr. Carola Frost 7/15.WBAT RLEfor transfers only,repeat films 8/19 L glenoid and scapula FXs- per Dr. Carola Frost R radial shaft FX- S/P ORIF by Dr. Carola Frost 7/15 L rib FX 2-4- pain control, pulm toilet ABL anemia History of +COVIDupon admission- not symptomatic, off precautions per ID FEN-regular diet VTE- SCDs, LMWH Dispo-4NP,working withTBI team therapies, TOC team working on insurance approval/SNF placement    LOS: 47 days    Violeta Gelinas, MD, MPH, FACS Trauma & General Surgery Use [email or web address] to contact on call provider  04/27/2020
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
## 2020-04-28 NOTE — Progress Notes (Signed)
Nutrition Follow-up  DOCUMENTATION CODES:   Not applicable  INTERVENTION:  ContinueEnsure Enlive poTID, each supplement provides 350 kcal and 20 grams of protein  Continue30 ml Prosource pluspoTID, each supplement provides 100 kcal and 15 grams of protein.  Encourage adequate PO intake.  NUTRITION DIAGNOSIS:   Increased nutrient needs related to wound healing as evidenced by estimated needs; ongoing  GOAL:   Patient will meet greater than or equal to 90% of their needs; progressing  MONITOR:   PO intake, Supplement acceptance, Skin, Weight trends, Labs, I & O's  REASON FOR ASSESSMENT:   Consult, Ventilator Enteral/tube feeding initiation and management  ASSESSMENT:   [facility] admitted with TBI, SDH, SAH, pneumocephalus, ICC, R orbit fx, R maxillary sinus fx, skull base fxs, R humeral head fx dislocation, R clavicle fx, R coracoid fx, L distal femur fx s/p ORIF 7/15, L ankle fx s/p ORIF 7/15, pelvic ring fx s/p pinning 7/15, L scapula fxs, R radial shaft fx s/p ORIF 7/15, and L rib fx 2-4. Pt covid + on admission. 7/23- extubated  Pt has been tolerating his po diet. Meal completion has been 50-100%. Pt currently has Ensure and Prosource Plus ordered with varied consumption. RD to continue with current orders to aid in increased caloric and protein needs.    Labs and medications reviewed.   Diet Order:   Diet Order            Diet regular Room service appropriate? Yes; Fluid consistency: Thin  Diet effective now                 EDUCATION NEEDS:   No education needs have been identified at this time  Skin:  Skin Assessment: Skin Integrity Issues: Skin Integrity Issues:: Incisions Incisions: R arm, L leg, L thigh, L hip Other: N/A  Last BM:  8/25  Height:   Ht Readings from Last 1 Encounters:  03/22/20 5\' 11"  (1.803 m)    Weight:   Wt Readings from Last 1 Encounters:  04/28/20 67.5 kg    Ideal Body Weight:  80.9 kg  BMI:  Body mass index is  20.75 kg/m.  Estimated Nutritional Needs:   Kcal:  2200-2400  Protein:  135-160 grams  Fluid:  2 L/day  04/30/20, MS, RD, LDN RD pager number/after hours weekend pager number on Amion.

## 2020-04-28 NOTE — Progress Notes (Addendum)
Patient ID: Sinjin Amero, male   DOB: [birth year], 39 y.o.   MRN: [id number] 43 Days Post-Op   Subjective: NAEO. Showered with OT yesterday. Tolerating PO eating eggs and potatoes for breakfast.  ROS negative except as listed above. Objective: Vital signs in last 24 hours: Temp:  [97.9 F (36.6 C)-98.3 F (36.8 C)] 98.2 F (36.8 C) (08/31 0742) Pulse Rate:  [61-93] 61 (08/31 0742) Resp:  [16-20] 20 (08/31 0742) BP: (110-120)/(76-85) 117/79 (08/31 0742) SpO2:  [75 %-100 %] 75 % (08/31 0742) Weight:  [67.5 kg] 67.5 kg (08/31 0500) Last BM Date: 04/26/20  Intake/Output from previous day: 08/30 0701 - 08/31 0700 In: -  Out: 250 [Urine:250] Intake/Output this shift: No intake/output data recorded.  General appearance: alert and cooperative Resp: clear to auscultation bilaterally Cardio: regular rate and rhythm GI: benign Extremities: boot LLE Neuro: calm, [name]  Studies/Results: No results found.  Anti-infectives: Anti-infectives (From admission, onward)   Start     Dose/Rate Route Frequency Ordered Stop   03/16/20 2049  vancomycin (VANCOCIN) powder  Status:  Discontinued          As needed 03/16/20 2049 03/16/20 2357   03/16/20 0600  ceFAZolin (ANCEF) IVPB 2g/100 mL premix  Status:  Discontinued        2 g 200 mL/hr over 30 Minutes Intravenous On call to O.R. 03/15/20 1920 03/17/20 0020   03/12/20 1215  ceFAZolin (ANCEF) IVPB 2g/100 mL premix        2 g 200 mL/hr over 30 Minutes Intravenous On call to O.R. 03/12/20 1026 03/12/20 1833   03/11/20 2345  Ampicillin-Sulbactam (UNASYN) 3 g in sodium chloride 0.9 % 100 mL IVPB        3 g 200 mL/hr over 30 Minutes Intravenous Every 6 hours 03/11/20 2344 03/18/20 2359      Assessment/Plan: PHBC  TBI/F ICC/SDH/SAH/pneumocephalus- per Dr. Maisie Fus, completed Unasyn, Keppra, F/U CT H 7/15 expected enlargement ICCs, F/U CT H 7/17 no signif change, therapies R orbit FX/R maxillary sinus FX/skull base FXs- per Dr. Uvaldo Rising R  humeral head FX dislocation/R clavicle FX/R coracoid FX- relocated in ED by Dr. August Saucer, reconstruction by Dr. August Saucer 7/20.NWB RUE,Cleared for increased ROM by Dr. August Saucer 8/18. L distal femur FX- S/P ORIF by Dr. Carola Frost 7/15, F/U films 8/19 L bimalleolar ankle FX- S/P ORIF by Dr. Carola Frost 7/15. CAM walker with getting up, NWB LLE, repeat films 8/19 Pelvic ring FX- S/P B SI pinning by Dr. Carola Frost 7/15.WBAT RLEfor transfers only,repeat films 8/19 L glenoid and scapula FXs- per Dr. Carola Frost R radial shaft FX- S/P ORIF by Dr. Carola Frost 7/15 L rib FX 2-4- pain control, pulm toilet ABL anemia History of +COVIDupon admission- not symptomatic, off precautions per ID FEN-regular diet VTE- SCDs, LMWH Dispo-4NP,working withTBI team therapies, TOC team working on insurance approval/SNF placement    LOS: 48 days   Hosie Spangle, PA-C  Trauma & General Surgery Use [email or web address] to contact on call provider  04/28/2020

## 2020-04-29 NOTE — TOC Progression Note (Signed)
Transition of Care [facility]) - Progression Note    Patient Details  Name: Anthony Macias MRN: [id number] Date of Birth: [birth year]  Transition of Care [facility]) CM/SW Contact  Astrid Drafts Berna Spare, RN Phone Number:  Clinical Narrative:  Received phone call from [facility] in [place] ([phone]):  She states facility is interested in patient, and would like to do a face-to-face visit, if possible.  She is also checking into patient's insurance and benefits.  She states she will contact me later in the week to arrange visit, and provide updates.       Expected Discharge Plan: Skilled Nursing Facility Barriers to Discharge: Continued Medical Work up, Inadequate or no insurance  Expected Discharge Plan and Services Expected Discharge Plan: Skilled Nursing Facility   Discharge Planning Services: CM Consult   Living arrangements for the past 2 months: Homeless                                       Social Determinants of Health (SDOH) Interventions    Readmission Risk Interventions No flowsheet data found.  Quintella Baton, RN, BSN  Trauma/Neuro ICU Case Manager [phone]

## 2020-04-29 NOTE — Progress Notes (Signed)
Patient ID: Anthony Macias, male   DOB: 08-06-1981, 39 y.o.   MRN: [id number] 44 Days Post-Op   Subjective: NAEO. Reports some stiffness in his right arm, making moving "awkward" sometimes.   ROS negative except as listed above. Objective: Vital signs in last 24 hours: Temp:  [97.8 F (36.6 C)-98.1 F (36.7 C)] 97.8 F (36.6 C) (08/31 1533) Pulse Rate:  [64-69] 69 (09/01 0357) Resp:  [18-20] 20 (08/31 2320) BP: (102-120)/(63-80) 120/80 (09/01 0357) SpO2:  [98 %] 98 % (08/31 1533) Weight:  [68 kg] 68 kg (09/01 0442) Last BM Date: 04/22/20  Intake/Output from previous day: 08/31 0701 - 09/01 0700 In: -  Out: 550 [Urine:550] Intake/Output this shift: No intake/output data recorded.  General appearance: alert and cooperative Resp: clear to auscultation bilaterally Cardio: regular rate and rhythm GI: benign Extremities: boot LLE Neuro: calm, [name]  Studies/Results: No results found.  Anti-infectives: Anti-infectives (From admission, onward)   Start     Dose/Rate Route Frequency Ordered Stop   03/16/20 2049  vancomycin (VANCOCIN) powder  Status:  Discontinued          As needed 03/16/20 2049 03/16/20 2357   03/16/20 0600  ceFAZolin (ANCEF) IVPB 2g/100 mL premix  Status:  Discontinued        2 g 200 mL/hr over 30 Minutes Intravenous On call to O.R. 03/15/20 1920 03/17/20 0020   03/12/20 1215  ceFAZolin (ANCEF) IVPB 2g/100 mL premix        2 g 200 mL/hr over 30 Minutes Intravenous On call to O.R. 03/12/20 1026 03/12/20 1833   03/11/20 2345  Ampicillin-Sulbactam (UNASYN) 3 g in sodium chloride 0.9 % 100 mL IVPB        3 g 200 mL/hr over 30 Minutes Intravenous Every 6 hours 03/11/20 2344 03/18/20 2359      Assessment/Plan: PHBC  TBI/F ICC/SDH/SAH/pneumocephalus- per Dr. Maisie Fus, completed Unasyn, Keppra, F/U CT H 7/15 expected enlargement ICCs, F/U CT H 7/17 no signif change, therapies R orbit FX/R maxillary sinus FX/skull base FXs- per Dr. Uvaldo Rising R humeral head FX  dislocation/R clavicle FX/R coracoid FX- relocated in ED by Dr. August Saucer, reconstruction by Dr. August Saucer 7/20.NWB RUE,Cleared for increased ROM by Dr. August Saucer 8/18. L distal femur FX- S/P ORIF by Dr. Carola Frost 7/15, F/U films 8/19 L bimalleolar ankle FX- S/P ORIF by Dr. Carola Frost 7/15. CAM walker with getting up, NWB LLE, repeat films 8/19 Pelvic ring FX- S/P B SI pinning by Dr. Carola Frost 7/15.WBAT RLEfor transfers only,repeat films 8/19 L glenoid and scapula FXs- per Dr. Carola Frost R radial shaft FX- S/P ORIF by Dr. Carola Frost 7/15 L rib FX 2-4- pain control, pulm toilet ABL anemia History of +COVIDupon admission- not symptomatic, off precautions per ID FEN-regular diet + supplementation per RD VTE- SCDs, LMWH Dispo-4NP,working withTBI team therapies, TOC team working on insurance approval/SNF placement. Per CM, a SNF in [place] may be coming today to assess the patient for placement in their facility.    LOS: 49 days   Hosie Spangle, PA-C  Trauma & General Surgery Use [email or web address] to contact on call provider  04/29/2020
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
## 2020-04-30 NOTE — TOC Progression Note (Signed)
Transition of Care [facility]) - Progression Note    Patient Details  Name: Md Smola MRN: [id number] Date of Birth: March 04, 1981  [facility]) CM/SW Contact  Astrid Drafts Berna Spare, RN Phone Number: 04/30/2020,  Clinical Narrative:  Left message for Joni Reining in admissions at [facility]; admissions liaison had planned to arrange to meet patient this week, in hopes of potential admission.  Will continue to reach out to facility to try to arrange meeting.      Expected Discharge Plan: Skilled Nursing Facility Barriers to Discharge: Continued Medical Work up, Inadequate or no insurance  Expected Discharge Plan and Services Expected Discharge Plan: Skilled Nursing Facility   Discharge Planning Services: CM Consult   Living arrangements for the past 2 months: Homeless                                       Social Determinants of Health (SDOH) Interventions    Readmission Risk Interventions No flowsheet data found.  Quintella Baton, RN, BSN  Trauma/Neuro ICU Case Manager [phone]

## 2020-04-30 NOTE — Progress Notes (Signed)
Patient ID: Anthony Macias, male   DOB: [birth year], 39 y.o.   MRN: [id number] 45 Days Post-Op   Subjective: NAEO.  ROS negative except as listed above. Objective: Vital signs in last 24 hours: Temp:  [98 F (36.7 C)-98.9 F (37.2 C)] 98.2 F (36.8 C) (09/02 0817) Pulse Rate:  [80-95] 82 (09/02 0817) Resp:  [14-18] 14 (09/02 0817) BP: (107-115)/(73-85) 112/74 (09/02 0817) SpO2:  [98 %-100 %] 100 % (09/02 0817) Weight:  [66.7 kg] 66.7 kg (09/02 0447) Last BM Date: 04/22/20  Intake/Output from previous day: 09/01 0701 - 09/02 0700 In: 240 [P.O.:240] Out: 1250 [Urine:1250] Intake/Output this shift: No intake/output data recorded.  General appearance: resting comfortably in bed, NAD Resp: normal effort, clear to auscultation bilaterally Cardio: regular rate and rhythm GI: benign Extremities: boot LLE Neuro: calm, [name]  Studies/Results: No results found.  Anti-infectives: Anti-infectives (From admission, onward)   Start     Dose/Rate Route Frequency Ordered Stop   03/16/20 2049  vancomycin (VANCOCIN) powder  Status:  Discontinued          As needed 03/16/20 2049 03/16/20 2357   03/16/20 0600  ceFAZolin (ANCEF) IVPB 2g/100 mL premix  Status:  Discontinued        2 g 200 mL/hr over 30 Minutes Intravenous On call to O.R. 03/15/20 1920 03/17/20 0020   03/12/20 1215  ceFAZolin (ANCEF) IVPB 2g/100 mL premix        2 g 200 mL/hr over 30 Minutes Intravenous On call to O.R. 03/12/20 1026 03/12/20 1833   03/11/20 2345  Ampicillin-Sulbactam (UNASYN) 3 g in sodium chloride 0.9 % 100 mL IVPB        3 g 200 mL/hr over 30 Minutes Intravenous Every 6 hours 03/11/20 2344 03/18/20 2359      Assessment/Plan: PHBC  TBI/F ICC/SDH/SAH/pneumocephalus- per Dr. Maisie Fus, completed Unasyn, Keppra, F/U CT H 7/15 expected enlargement ICCs, F/U CT H 7/17 no signif change, therapies R orbit FX/R maxillary sinus FX/skull base FXs- per Dr. Uvaldo Rising R humeral head FX dislocation/R clavicle FX/R  coracoid FX- relocated in ED by Dr. August Saucer, reconstruction by Dr. August Saucer 7/20.NWB RUE,Cleared for increased ROM by Dr. August Saucer 8/18. L distal femur FX- S/P ORIF by Dr. Carola Frost 7/15, F/U films 8/19 L bimalleolar ankle FX- S/P ORIF by Dr. Carola Frost 7/15. CAM walker with getting up, NWB LLE, repeat films 8/19 Pelvic ring FX- S/P B SI pinning by Dr. Carola Frost 7/15.WBAT RLEfor transfers only,repeat films 8/19 L glenoid and scapula FXs- per Dr. Carola Frost R radial shaft FX- S/P ORIF by Dr. Carola Frost 7/15 L rib FX 2-4- pain control, pulm toilet ABL anemia History of +COVIDupon admission- not symptomatic, off precautions per ID FEN-regular diet + supplementation per RD VTE- SCDs, LMWH Dispo-4NP,working withTBI team therapies, TOC team working on insurance approval/SNF placement. Per CM, a SNF in [place] may be coming this week to assess the patient for placement in their facility.   LOS: 50 days   Hosie Spangle, PA-C  Trauma & General Surgery Use [email or web address] to contact on call provider  04/30/2020
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
## 2020-05-01 NOTE — Progress Notes (Signed)
Patient ID: Anthony Macias, male   DOB: [birth year], 39 y.o.   MRN: [id number] 46 Days Post-Op   Subjective: Eating, no new complaints.  ROS negative except as listed above. Objective: Vital signs in last 24 hours: Temp:  [98 F (36.7 C)-99 F (37.2 C)] 98.6 F (37 C) (09/03 0732) Pulse Rate:  [66-94] 73 (09/03 0732) Resp:  [18] 18 (09/03 0732) BP: (101-119)/(69-80) 108/77 (09/03 0732) SpO2:  [96 %-100 %] 100 % (09/03 0732) Last BM Date: 04/30/20  Intake/Output from previous day: 09/02 0701 - 09/03 0700 In: 600 [P.O.:600] Out: -  Intake/Output this shift: Total I/O In: -  Out: 300 [Urine:300]  General appearance: alert and cooperative Resp: clear to auscultation bilaterally Cardio: regular rate and rhythm GI: soft, NT Extremities: calves soft Neurologic: Mental status: alert and [name]  Lab Results: CBC  No results for input(s): WBC, HGB, HCT, PLT in the last 72 hours. BMET No results for input(s): NA, K, CL, CO2, GLUCOSE, BUN, CREATININE, CALCIUM in the last 72 hours. PT/INR No results for input(s): LABPROT, INR in the last 72 hours. ABG No results for input(s): PHART, HCO3 in the last 72 hours.  Invalid input(s): PCO2, PO2  Studies/Results: No results found.  Anti-infectives: Anti-infectives (From admission, onward)   Start     Dose/Rate Route Frequency Ordered Stop   03/16/20 2049  vancomycin (VANCOCIN) powder  Status:  Discontinued          As needed 03/16/20 2049 03/16/20 2357   03/16/20 0600  ceFAZolin (ANCEF) IVPB 2g/100 mL premix  Status:  Discontinued        2 g 200 mL/hr over 30 Minutes Intravenous On call to O.R. 03/15/20 1920 03/17/20 0020   03/12/20 1215  ceFAZolin (ANCEF) IVPB 2g/100 mL premix        2 g 200 mL/hr over 30 Minutes Intravenous On call to O.R. 03/12/20 1026 03/12/20 1833   03/11/20 2345  Ampicillin-Sulbactam (UNASYN) 3 g in sodium chloride 0.9 % 100 mL IVPB        3 g 200 mL/hr over 30 Minutes Intravenous Every 6 hours 03/11/20  2344 03/18/20 2359      Assessment/Plan: PHBC  TBI/F ICC/SDH/SAH/pneumocephalus- per Dr. Maisie Fus, completed Unasyn, Keppra, F/U CT H 7/15 expected enlargement ICCs, F/U CT H 7/17 no signif change, therapies R orbit FX/R maxillary sinus FX/skull base FXs- per Dr. Uvaldo Rising R humeral head FX dislocation/R clavicle FX/R coracoid FX- relocated in ED by Dr. August Saucer, reconstruction by Dr. August Saucer 7/20.NWB RUE,Cleared for increased ROM by Dr. August Saucer 8/18. L distal femur FX- S/P ORIF by Dr. Carola Frost 7/15, F/U films 8/19 L bimalleolar ankle FX- S/P ORIF by Dr. Carola Frost 7/15. CAM walker with getting up, NWB LLE, repeat films 8/19 Pelvic ring FX- S/P B SI pinning by Dr. Carola Frost 7/15.WBAT RLEfor transfers only,repeat films 8/19 L glenoid and scapula FXs- per Dr. Carola Frost R radial shaft FX- S/P ORIF by Dr. Carola Frost 7/15 L rib FX 2-4- pain control, pulm toilet ABL anemia History of +COVIDupon admission- not symptomatic, off precautions per ID FEN-regular diet + supplementation per RD VTE- SCDs, LMWH Dispo-4NP,working withTBI team therapies, TOC team working on insurance approval/SNF placement.   LOS: 51 days    Violeta Gelinas, MD, MPH, FACS Trauma & General Surgery Use [email or web address] to contact on call provider  05/01/2020
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
## 2020-05-02 NOTE — Progress Notes (Signed)
47 Days Post-Op   Subjective/Chief Complaint: No changes over night Continues to work with therapy   Objective: Vital signs in last 24 hours: Temp:  [98 F (36.7 C)-98.6 F (37 C)] 98 F (36.7 C) (09/04 0825) Pulse Rate:  [60-94] 60 (09/04 0825) Resp:  [18-20] 20 (09/04 0825) BP: (108-119)/(69-84) 108/76 (09/04 0825) SpO2:  [98 %-100 %] 99 % (09/04 0825) Weight:  [66 kg] 66 kg (09/04 0500) Last BM Date: 04/30/20  Intake/Output from previous day: 09/03 0701 - 09/04 0700 In: 480 [P.O.:480] Out: 1175 [Urine:1175] Intake/Output this shift: No intake/output data recorded.  Exam: Awake, appears comfortable, talking some Lungs clear Abdomen soft NT Ext stable  Lab Results:  No results for input(s): WBC, HGB, HCT, PLT in the last 72 hours. BMET No results for input(s): NA, K, CL, CO2, GLUCOSE, BUN, CREATININE, CALCIUM in the last 72 hours. PT/INR No results for input(s): LABPROT, INR in the last 72 hours. ABG No results for input(s): PHART, HCO3 in the last 72 hours.  Invalid input(s): PCO2, PO2  Studies/Results: No results found.  Anti-infectives: Anti-infectives (From admission, onward)   Start     Dose/Rate Route Frequency Ordered Stop   03/16/20 2049  vancomycin (VANCOCIN) powder  Status:  Discontinued          As needed 03/16/20 2049 03/16/20 2357   03/16/20 0600  ceFAZolin (ANCEF) IVPB 2g/100 mL premix  Status:  Discontinued        2 g 200 mL/hr over 30 Minutes Intravenous On call to O.R. 03/15/20 1920 03/17/20 0020   03/12/20 1215  ceFAZolin (ANCEF) IVPB 2g/100 mL premix        2 g 200 mL/hr over 30 Minutes Intravenous On call to O.R. 03/12/20 1026 03/12/20 1833   03/11/20 2345  Ampicillin-Sulbactam (UNASYN) 3 g in sodium chloride 0.9 % 100 mL IVPB        3 g 200 mL/hr over 30 Minutes Intravenous Every 6 hours 03/11/20 2344 03/18/20 2359      Assessment/Plan: PHBC  TBI/F ICC/SDH/SAH/pneumocephalus- per Dr. Maisie Fus, completed Unasyn, Keppra, F/U CT H  7/15 expected enlargement ICCs, F/U CT H 7/17 no signif change, therapies R orbit FX/R maxillary sinus FX/skull base FXs- per Dr. Uvaldo Rising R humeral head FX dislocation/R clavicle FX/R coracoid FX- relocated in ED by Dr. August Saucer, reconstruction by Dr. August Saucer 7/20.NWB RUE,Cleared for increased ROM by Dr. August Saucer 8/18. L distal femur FX- S/P ORIF by Dr. Carola Frost 7/15, F/U films 8/19 L bimalleolar ankle FX- S/P ORIF by Dr. Carola Frost 7/15. CAM walker with getting up, NWB LLE, repeat films 8/19 Pelvic ring FX- S/P B SI pinning by Dr. Carola Frost 7/15.WBAT RLEfor transfers only,repeat films 8/19 L glenoid and scapula FXs- per Dr. Carola Frost R radial shaft FX- S/P ORIF by Dr. Carola Frost 7/15 L rib FX 2-4- pain control, pulm toilet ABL anemia History of +COVIDupon admission- not symptomatic, off precautions per ID FEN-regular diet + supplementation per RD VTE- SCDs, LMWH Dispo-4NP,working withTBI team therapies, TOC team working on insurance approval/SNF placement. Per CM, a SNF in [place] coming this week to assess the patient for placement in their facility.  LOS: 52 days    Anthony Macias 05/02/2020

## 2020-05-03 NOTE — Progress Notes (Signed)
° °  Trauma/Critical Care Follow Up Note  Subjective:    Overnight Issues:   Objective:  Vital signs for last 24 hours: Temp:  [98 F (36.7 C)-98.7 F (37.1 C)] 98 F (36.7 C) (09/05 0809) Pulse Rate:  [61-79] 72 (09/05 0809) Resp:  [16-20] 20 (09/05 0809) BP: (106-120)/(76-85) 120/83 (09/05 0809) SpO2:  [100 %] 100 % (09/05 0809)  Hemodynamic parameters for last 24 hours:    Intake/Output from previous day: 09/04 0701 - 09/05 0700 In: -  Out: 1050 [Urine:1050]  Intake/Output this shift: No intake/output data recorded.  Vent settings for last 24 hours:    Physical Exam:  Gen: comfortable, no distress Neuro: nonfocal exam HEENT: PERRL Neck: supple CV: RRR Pulm: unlabored breathing on RA Abd: soft, NT GU: clear yellow urine, foley Extr: wwp, no edema   No results found for this or any previous visit (from the past 24 hour(s)).  Assessment & Plan:  Present on Admission: **None**    LOS: 53 days   Additional comments:I reviewed the patient's new clinical lab test results.   and I reviewed the patients new imaging test results.    PHBC  TBI/F ICC/SDH/SAH/pneumocephalus- per Dr. Maisie Fus, completed Unasyn, Keppra, F/U CT H 7/15 expected enlargement ICCs, F/U CT H 7/17 no signif change, therapies R orbit FX/R maxillary sinus FX/skull base FXs- per Dr. Uvaldo Rising R humeral head FX dislocation/R clavicle FX/R coracoid FX- relocated in ED by Dr. August Saucer, reconstruction by Dr. August Saucer 7/20.NWB RUE,Cleared for increased ROM by Dr. August Saucer 8/18. L distal femur FX- S/P ORIF by Dr. Carola Frost 7/15, F/U films 8/19 L bimalleolar ankle FX- S/P ORIF by Dr. Carola Frost 7/15. CAM walker with getting up, NWB LLE, repeat films 8/19 Pelvic ring FX- S/P B SI pinning by Dr. Carola Frost 7/15.WBAT RLEfor transfers only,repeat films 8/19 L glenoid and scapula FXs- per Dr. Carola Frost R radial shaft FX- S/P ORIF by Dr. Carola Frost 7/15 L rib FX 2-4- pain control, pulm toilet ABL anemia History of +COVIDupon  admission- not symptomatic, off precautions per ID FEN-regular diet + supplementation per RD VTE- SCDs, LMWH Dispo-4NP,working withTBI team therapies, TOC team working on insurance approval/SNF placement. Per CM, [place] week[place] assess the patient for placement in their facility.  Diamantina Monks, MD Trauma & General Surgery Please use [email or web address] to contact on call provider  05/03/2020  *Care during the described time interval was provided by me. I have reviewed this patient's available data, including medical history, events of note, physical examination and test results as part of my evaluation.

## 2020-05-04 ENCOUNTER — Encounter (HOSPITAL_COMMUNITY): Payer: Self-pay

## 2020-05-04 NOTE — Progress Notes (Signed)
[facility] Progress Note  49 Days Post-Op  Subjective: CC-  Sitting up in bed watching tv. Asking if we could trim his fingernails. Otherwise no complaints.  Objective: Vital signs in last 24 hours: Temp:  [98 F (36.7 C)-98.9 F (37.2 C)] 98.9 F (37.2 C) (09/06 0721) Pulse Rate:  [60-83] 60 (09/06 0721) Resp:  [15-20] 15 (09/06 0721) BP: (103-118)/(72-81) 118/81 (09/06 0721) SpO2:  [98 %-100 %] 100 % (09/06 0721) Last BM Date: 05/03/20  Intake/Output from previous day: 09/05 0701 - 09/06 0700 In: 240 [P.O.:240] Out: 350 [Urine:350] Intake/Output this shift: Total I/O In: -  Out: 420 [Urine:420]  PE: Gen:  Alert, NAD, pleasant HEENT: EOM's intact, pupils equal and round Card:  RRR Pulm:  CTAB, no W/R/R, rate and effort normal Abd: Soft, NT/ND, +BS Ext:  Boot to LLE Neuro: f/c Skin: no rashes noted, warm and dry  Lab Results:  No results for input(s): WBC, HGB, HCT, PLT in the last 72 hours. BMET No results for input(s): NA, K, CL, CO2, GLUCOSE, BUN, CREATININE, CALCIUM in the last 72 hours. PT/INR No results for input(s): LABPROT, INR in the last 72 hours. CMP     Component Value Date/Time   NA 143 04/06/2020 0600   K 4.5 04/06/2020 0600   CL 103 04/06/2020 0600   CO2 28 04/06/2020 0600   GLUCOSE 114 (H) 04/06/2020 0600   BUN 24 (H) 04/06/2020 0600   CREATININE 0.75 04/06/2020 0600   CALCIUM 10.0 04/06/2020 0600   PROT 6.0 (L) 03/12/2020 0126   ALBUMIN 3.6 03/12/2020 0126   AST 228 (H) 03/12/2020 0126   ALT 96 (H) 03/12/2020 0126   ALKPHOS 51 03/12/2020 0126   BILITOT 0.6 03/12/2020 0126   GFRNONAA >60 04/06/2020 0600   GFRAA >60 04/06/2020 0600   Lipase  No results found for: LIPASE     Studies/Results: No results found.  Anti-infectives: Anti-infectives (From admission, onward)   Start     Dose/Rate Route Frequency Ordered Stop   03/16/20 2049  vancomycin (VANCOCIN) powder  Status:  Discontinued          As needed 03/16/20  2049 03/16/20 2357   03/16/20 0600  ceFAZolin (ANCEF) IVPB 2g/100 mL premix  Status:  Discontinued        2 g 200 mL/hr over 30 Minutes Intravenous On call to O.R. 03/15/20 1920 03/17/20 0020   03/12/20 1215  ceFAZolin (ANCEF) IVPB 2g/100 mL premix        2 g 200 mL/hr over 30 Minutes Intravenous On call to O.R. 03/12/20 1026 03/12/20 1833   03/11/20 2345  Ampicillin-Sulbactam (UNASYN) 3 g in sodium chloride 0.9 % 100 mL IVPB        3 g 200 mL/hr over 30 Minutes Intravenous Every 6 hours 03/11/20 2344 03/18/20 2359       Assessment/Plan PHBC  TBI/F ICC/SDH/SAH/pneumocephalus- per Dr. Maisie Fus, completed Unasyn, Keppra, F/U CT H 7/15 expected enlargement ICCs, F/U CT H 7/17 no signif change, therapies R orbit FX/R maxillary sinus FX/skull base FXs- per Dr. Uvaldo Rising R humeral head FX dislocation/R clavicle FX/R coracoid FX- relocated in ED by Dr. August Saucer, reconstruction by Dr. August Saucer 7/20.NWB RUE,Cleared for increased ROM by Dr. August Saucer 8/18. L distal femur FX- S/P ORIF by Dr. Carola Frost 7/15, F/U films 8/19 L bimalleolar ankle FX- S/P ORIF by Dr. Carola Frost 7/15. CAM walker with getting up, NWB LLE, repeat films 8/19 Pelvic ring FX- S/P B SI pinning by Dr. Carola Frost 7/15.WBAT RLEfor  transfers only,repeat films 8/19 L glenoid and scapula FXs- per Dr. Carola Frost R radial shaft FX- S/P ORIF by Dr. Carola Frost 7/15 L rib FX 2-4- pain control, pulm toilet ABL anemia History of +COVIDupon admission- not symptomatic, off precautions per ID FEN-regular diet + supplementation per RD VTE- SCDs, LMWH Dispo-4NP,working withTBI team therapies, TOC team working on insurance approval/SNF placement. [place] CM[place] the patient for placement in their facility.   LOS: 54 days    Franne Forts, [facility] 05/04/2020, 11:03 AM Please see Amion for pager number during day hours 7:00am-4:30pm

## 2020-05-04 NOTE — Progress Notes (Signed)
Pt continues to refuse medications. Pt stated "I've had enough medications. I just want to be left alone". This RN educated pt on the importance of take prescribed medications. Will continue to monitor.

## 2020-05-05 LAB — BASIC METABOLIC PANEL WITH GFR
Anion gap: 9 (ref 5–15)
BUN: 12 mg/dL (ref 6–20)
CO2: 26 mmol/L (ref 22–32)
Calcium: 9.6 mg/dL (ref 8.9–10.3)
Chloride: 103 mmol/L (ref 98–111)
Creatinine, Ser: 0.75 mg/dL (ref 0.61–1.24)
GFR calc Af Amer: >60 mL/min (ref >60)
GFR calc non Af Amer: >60 mL/min (ref >60)
Glucose, Bld: 123 mg/dL — ABNORMAL HIGH (ref 70–99)
Potassium: 4 mmol/L (ref 3.5–5.1)
Sodium: 138 mmol/L (ref 135–145)

## 2020-05-05 LAB — CBC
HCT: 40.8 % (ref 39.0–52.0)
Hemoglobin: 13.7 g/dL (ref 13.0–17.0)
MCH: 29.1 pg (ref 26.0–34.0)
MCHC: 33.6 g/dL (ref 30.0–36.0)
MCV: 86.8 fL (ref 80.0–100.0)
Platelets: 305 K/uL (ref 150–400)
RBC: 4.7 MIL/uL (ref 4.22–5.81)
RDW: 13.3 % (ref 11.5–15.5)
WBC: 5.1 K/uL (ref 4.0–10.5)
nRBC: 0 % (ref 0.0–0.2)

## 2020-05-05 LAB — MAGNESIUM: Magnesium: 1.7 mg/dL (ref 1.7–2.4)

## 2020-05-05 MED ORDER — ENOXAPARIN SODIUM 40 MG/0.4ML ~~LOC~~ SOLN
40.0000 mg | SUBCUTANEOUS | Status: DC
  Administered 2020-05-08 – 2020-05-10 (×3): 40 mg via SUBCUTANEOUS
  Filled 2020-05-05 (×4): qty 0.4

## 2020-05-05 NOTE — TOC Progression Note (Addendum)
Transition of Care [facility]) - Progression Note    Patient Details  Name: Anthony Macias MRN: [id number] Date of Birth: 1981-06-21  Transition of [facility]) CM/SW Contact  Doy Hutching, [place] Phone Number: 05/05/2020, 3:16 PM  Clinical Narrative:    No indication if SNF has visited pt, CSW has left message with Joni Reining at [place] [phone]) per [facility] Julie's notes.    Expected Discharge Plan: Skilled Nursing Facility Barriers to Discharge: Continued Medical Work up, Inadequate or no insurance  Expected Discharge Plan and Services Expected Discharge Plan: Skilled Nursing Facility Discharge Planning Services: CM Consult Living arrangements for the past 2 months: Homeless                  Readmission Risk Interventions No flowsheet data found.
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
## 2020-05-05 NOTE — Progress Notes (Signed)
Nutrition Follow-up  DOCUMENTATION CODES:   Not applicable  INTERVENTION:  ContinueEnsure Enlive poTID, each supplement provides 350 kcal and 20 grams of protein  Continue30 ml Prosource pluspoTID, each supplement provides 100 kcal and 15 grams of protein.  Encourage adequate PO intake.  NUTRITION DIAGNOSIS:   Increased nutrient needs related to wound healing as evidenced by estimated needs; ongoing  GOAL:   Patient will meet greater than or equal to 90% of their needs; met  MONITOR:   PO intake, Supplement acceptance, Skin, Weight trends, Labs, I & O's  REASON FOR ASSESSMENT:   Consult, Ventilator Enteral/tube feeding initiation and management  ASSESSMENT:   [facility] admitted with TBI, SDH, SAH, pneumocephalus, ICC, R orbit fx, R maxillary sinus fx, skull base fxs, R humeral head fx dislocation, R clavicle fx, R coracoid fx, L distal femur fx s/p ORIF 7/15, L ankle fx s/p ORIF 7/15, pelvic ring fx s/p pinning 7/15, L scapula fxs, R radial shaft fx s/p ORIF 7/15, and L rib fx 2-4. Pt covid + on admission. 7/23- extubated  Meal completion has been 90-100%. Pt has been tolerating his po diet. Pt currently has Ensure and Prostat ordered with varied consumption. RD to continue with current orders to aid in caloric and protein needs. SNF placement ongoing.   Labs and medications reviewed.   Diet Order:   Diet Order            Diet regular Room service appropriate? Yes; Fluid consistency: Thin  Diet effective now                 EDUCATION NEEDS:   No education needs have been identified at this time  Skin:  Skin Assessment: Skin Integrity Issues: Skin Integrity Issues:: Incisions Incisions: R arm, L leg, L thigh, L hip Other: N/A  Last BM:  9/6  Height:   Ht Readings from Last 1 Encounters:  03/22/20 _0  (1.803 m)    Weight:   Wt Readings from Last 1 Encounters:  05/02/20 66 kg   BMI:  Body mass index is 20.29 kg/m.  Estimated Nutritional  Needs:   Kcal:  2200-2400  Protein:  135-160 grams  Fluid:  2 L/day  Corrin Parker, MS, RD, LDN RD pager number/after hours weekend pager number on Amion.

## 2020-05-05 NOTE — Progress Notes (Addendum)
[facility] Progress Note  50 Days Post-Op  Subjective: CC-  Tired this morning. No complaints. Wants to be left alone.  Objective: Vital signs in last 24 hours: Temp:  [98.2 F (36.8 C)-98.9 F (37.2 C)] 98.9 F (37.2 C) (09/07 0738) Pulse Rate:  [67-86] 67 (09/07 0738) Resp:  [14-18] 14 (09/07 0738) BP: (109-114)/(74-93) 114/84 (09/07 0738) SpO2:  [98 %-100 %] 100 % (09/07 0738) Last BM Date: 05/04/20  Intake/Output from previous day: 09/06 0701 - 09/07 0700 In: 900 [P.O.:900] Out: 1140 [Urine:1140] Intake/Output this shift: No intake/output data recorded.  PE: Gen:  Alert, NAD Pulm:  rate and effort normal Abd: Soft, NT/ND, +BS Ext:  Boot to LLE Neuro: f/c Skin: no rashes noted, warm and dry   Lab Results:  No results for input(s): WBC, HGB, HCT, PLT in the last 72 hours. BMET No results for input(s): NA, K, CL, CO2, GLUCOSE, BUN, CREATININE, CALCIUM in the last 72 hours. PT/INR No results for input(s): LABPROT, INR in the last 72 hours. CMP     Component Value Date/Time   NA 143 04/06/2020 0600   K 4.5 04/06/2020 0600   CL 103 04/06/2020 0600   CO2 28 04/06/2020 0600   GLUCOSE 114 (H) 04/06/2020 0600   BUN 24 (H) 04/06/2020 0600   CREATININE 0.75 04/06/2020 0600   CALCIUM 10.0 04/06/2020 0600   PROT 6.0 (L) 03/12/2020 0126   ALBUMIN 3.6 03/12/2020 0126   AST 228 (H) 03/12/2020 0126   ALT 96 (H) 03/12/2020 0126   ALKPHOS 51 03/12/2020 0126   BILITOT 0.6 03/12/2020 0126   GFRNONAA >60 04/06/2020 0600   GFRAA >60 04/06/2020 0600   Lipase  No results found for: LIPASE     Studies/Results: No results found.  Anti-infectives: Anti-infectives (From admission, onward)   Start     Dose/Rate Route Frequency Ordered Stop   03/16/20 2049  vancomycin (VANCOCIN) powder  Status:  Discontinued          As needed 03/16/20 2049 03/16/20 2357   03/16/20 0600  ceFAZolin (ANCEF) IVPB 2g/100 mL premix  Status:  Discontinued        2 g 200 mL/hr  over 30 Minutes Intravenous On call to O.R. 03/15/20 1920 03/17/20 0020   03/12/20 1215  ceFAZolin (ANCEF) IVPB 2g/100 mL premix        2 g 200 mL/hr over 30 Minutes Intravenous On call to O.R. 03/12/20 1026 03/12/20 1833   03/11/20 2345  Ampicillin-Sulbactam (UNASYN) 3 g in sodium chloride 0.9 % 100 mL IVPB        3 g 200 mL/hr over 30 Minutes Intravenous Every 6 hours 03/11/20 2344 03/18/20 2359       Assessment/Plan PHBC 03/11/20  TBI/F ICC/SDH/SAH/pneumocephalus- per Dr. Maisie Fus, completed Unasyn, Keppra, F/U CT H 7/15 expected enlargement ICCs, F/U CT H 7/17 no signif change, therapies R orbit FX/R maxillary sinus FX/skull base FXs- per Dr. Uvaldo Rising R humeral head FX dislocation/R clavicle FX/R coracoid FX- relocated in ED by Dr. August Saucer, reconstruction by Dr. August Saucer 7/20.NWB RUE,Cleared for increased ROM by Dr. August Saucer 8/18. L distal femur FX- S/P ORIF by Dr. Carola Frost 7/15, F/U films 8/19 L bimalleolar ankle FX- S/P ORIF by Dr. Carola Frost 7/15. WBAT LLE in CAM Pelvic ring FX- S/P B SI pinning by Dr. Carola Frost 7/15.WBAT RLE L glenoid and scapula FXs- per Dr. Carola Frost R radial shaft FX- S/P ORIF by Dr. Carola Frost 7/15 L rib FX 2-4- pain control, pulm toilet ABL anemia-  stable History of +COVIDupon admission- not symptomatic, off precautions per ID FEN-regular diet + supplementation per RD VTE- SCDs, LMWH Dispo-Transfer to the floor. Check labs. Continue TBI team therapies, TOC team working on insurance approval/SNF placement. Per CM, [place] week[place] assess the patient for placement in their facility.   LOS: 55 days    Franne Forts, [facility] 05/05/2020, 8:24 AM Please see Amion for pager number during day hours 7:00am-4:30pm

## 2020-05-06 MED ORDER — ONDANSETRON HCL 4 MG PO TABS: 4.0000 mg | ORAL_TABLET | Freq: Four times a day (QID) | ORAL | Status: DC | PRN

## 2020-05-06 MED ORDER — ONDANSETRON HCL 4 MG/2ML IJ SOLN: 4.0000 mg | Freq: Four times a day (QID) | INTRAMUSCULAR | Status: DC | PRN
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
## 2020-05-06 NOTE — Progress Notes (Signed)
51 Days Post-Op  Subjective: No new complaints.  ROS: See above, otherwise other systems negative  Objective: Vital signs in last 24 hours: Temp:  [98.5 F (36.9 C)-99.8 F (37.7 C)] 98.5 F (36.9 C) (09/08 0814) Pulse Rate:  [60-86] 61 (09/08 0814) Resp:  [14-18] 18 (09/08 0814) BP: (104-131)/(72-81) 112/81 (09/08 0814) SpO2:  [99 %-100 %] 100 % (09/08 0814) Last BM Date: 05/04/20  Intake/Output from previous day: 09/07 0701 - 09/08 0700 In: 640 [P.O.:640] Out: 1025 [Urine:1025] Intake/Output this shift: No intake/output data recorded.  PE: Gen: Alert, NAD Pulm: rate and effort normal Abd: Soft, NT/ND, +BS VZC:HYIF to LLE Neuro: f/c Skin: no rashes noted, warm and dry   Lab Results:  Recent Labs    05/05/20 0853  WBC 5.1  HGB 13.7  HCT 40.8  PLT 305   BMET Recent Labs    05/05/20 0853  NA 138  K 4.0  CL 103  CO2 26  GLUCOSE 123*  BUN 12  CREATININE 0.75  CALCIUM 9.6   PT/INR No results for input(s): LABPROT, INR in the last 72 hours. CMP     Component Value Date/Time   NA 138 05/05/2020 0853   K 4.0 05/05/2020 0853   CL 103 05/05/2020 0853   CO2 26 05/05/2020 0853   GLUCOSE 123 (H) 05/05/2020 0853   BUN 12 05/05/2020 0853   CREATININE 0.75 05/05/2020 0853   CALCIUM 9.6 05/05/2020 0853   PROT 6.0 (L) 03/12/2020 0126   ALBUMIN 3.6 03/12/2020 0126   AST 228 (H) 03/12/2020 0126   ALT 96 (H) 03/12/2020 0126   ALKPHOS 51 03/12/2020 0126   BILITOT 0.6 03/12/2020 0126   GFRNONAA >60 05/05/2020 0853   GFRAA >60 05/05/2020 0853   Lipase  No results found for: LIPASE     Studies/Results: No results found.  Anti-infectives: Anti-infectives (From admission, onward)   Start     Dose/Rate Route Frequency Ordered Stop   03/16/20 2049  vancomycin (VANCOCIN) powder  Status:  Discontinued          As needed 03/16/20 2049 03/16/20 2357   03/16/20 0600  ceFAZolin (ANCEF) IVPB 2g/100 mL premix  Status:  Discontinued        2 g 200  mL/hr over 30 Minutes Intravenous On call to O.R. 03/15/20 1920 03/17/20 0020   03/12/20 1215  ceFAZolin (ANCEF) IVPB 2g/100 mL premix        2 g 200 mL/hr over 30 Minutes Intravenous On call to O.R. 03/12/20 1026 03/12/20 1833   03/11/20 2345  Ampicillin-Sulbactam (UNASYN) 3 g in sodium chloride 0.9 % 100 mL IVPB        3 g 200 mL/hr over 30 Minutes Intravenous Every 6 hours 03/11/20 2344 03/18/20 2359       Assessment/Plan PHBC 03/11/20 TBI/F ICC/SDH/SAH/pneumocephalus- per Dr. Maisie Fus, completed Unasyn, Keppra, F/U CT H 7/15 expected enlargement ICCs, F/U CT H 7/17 no signif change, therapies R orbit FX/R maxillary sinus FX/skull base FXs- per Dr. Uvaldo Rising R humeral head FX dislocation/R clavicle FX/R coracoid FX- relocated in ED by Dr. August Saucer, reconstruction by Dr. August Saucer 7/20.NWB RUE,Cleared for increased ROM by Dr. August Saucer 8/18. L distal femur FX- S/P ORIF by Dr. Carola Frost 7/15, F/U films 8/19 L bimalleolar ankle FX- S/P ORIF by Dr. Carola Frost 7/15. WBAT LLE in CAM Pelvic ring FX- S/P B SI pinning by Dr. Carola Frost 7/15.WBAT RLE L glenoid and scapula FXs- per Dr. Carola Frost R radial shaft FX- S/P ORIF by  Dr. Carola Frost 7/15 L rib FX 2-4- pain control, pulm toilet ABL anemia- stable History of +COVIDupon admission- not symptomatic, off precautions per ID FEN-regular diet + supplementation per RD VTE- SCDs, LMWH Dispo-Transfer to the floor. Check labs. Continue TBI team therapies, TOC team working on insurance approval/SNF placement. [place] CM[place] the patient for placement in their facility.   LOS: 56 days    [name] , [facility] Surgery 05/06/2020, 9:02 AM Please see Amion for pager number during day hours 7:00am-4:30pm or 7:00am -11:30am on weekends
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
## 2020-05-07 NOTE — TOC Progression Note (Signed)
Transition of Care [facility]) - Progression Note    Patient Details  Name: Anthony Macias MRN: [id number] Date of Birth: 12-Jul-1981  Transition of Care [facility]) CM/SW Contact  Astrid Drafts Berna Spare, RN Phone Number: 05/07/2020, 3:38 PM  Clinical Narrative:   Patient visited this morning by admissions staff from [facility] skilled nursing facility in [place].  Visit went well; facility prepared to offer a bed patient.  He states he would like to discuss this with his family prior to deciding.  I spoke with his mother, Anthony Macias, and discussed the bed offer with her.  She understands that this is the only bed offer patient has at this time.  She plans to visit patient this evening and discuss facility with patient; she plans also to investigate [facility] on the Internet.  Will have CSW follow-up with patient and mother tomorrow with their decision on bed offer.    Expected Discharge Plan: Skilled Nursing Facility Barriers to Discharge: Continued Medical Work up, Inadequate or no insurance  Expected Discharge Plan and Services Expected Discharge Plan: Skilled Nursing Facility   Discharge Planning Services: CM Consult   Living arrangements for the past 2 months: Homeless                                       Social Determinants of Health (SDOH) Interventions    Readmission Risk Interventions No flowsheet data found.  Quintella Baton, RN, BSN  Trauma/Neuro ICU Case Manager [phone]

## 2020-05-07 NOTE — Progress Notes (Signed)
[facility] Progress Note  52 Days Post-Op  Subjective: Patient reports nails are too long on both hands. Tolerating diet and denies pain. Oriented to place, time, self.   Objective: Vital signs in last 24 hours: Temp:  [98 F (36.7 C)-98.5 F (36.9 C)] 98 F (36.7 C) (09/09 0814) Pulse Rate:  [63-87] 68 (09/09 0814) Resp:  [16-20] 18 (09/09 0814) BP: (107-129)/(75-92) 107/92 (09/09 0814) SpO2:  [99 %-100 %] 100 % (09/09 0814) FiO2 (%):  [40 %] 40 % (09/09 0406) Last BM Date: 05/04/20  Intake/Output from previous day: 09/08 0701 - 09/09 0700 In: 600 [P.O.:600] Out: 300 [Urine:300] Intake/Output this shift: No intake/output data recorded.  PE: Gen: Alert, NAD Pulm: rate and effort normal Abd: Soft, NT/ND, +BS VQQ:VZDG to LLE Neuro: f/c Skin: no rashes noted, warm and dry   Lab Results:  Recent Labs    05/05/20 0853  WBC 5.1  HGB 13.7  HCT 40.8  PLT 305   BMET Recent Labs    05/05/20 0853  NA 138  K 4.0  CL 103  CO2 26  GLUCOSE 123*  BUN 12  CREATININE 0.75  CALCIUM 9.6   PT/INR No results for input(s): LABPROT, INR in the last 72 hours. CMP     Component Value Date/Time   NA 138 05/05/2020 0853   K 4.0 05/05/2020 0853   CL 103 05/05/2020 0853   CO2 26 05/05/2020 0853   GLUCOSE 123 (H) 05/05/2020 0853   BUN 12 05/05/2020 0853   CREATININE 0.75 05/05/2020 0853   CALCIUM 9.6 05/05/2020 0853   PROT 6.0 (L) 03/12/2020 0126   ALBUMIN 3.6 03/12/2020 0126   AST 228 (H) 03/12/2020 0126   ALT 96 (H) 03/12/2020 0126   ALKPHOS 51 03/12/2020 0126   BILITOT 0.6 03/12/2020 0126   GFRNONAA >60 05/05/2020 0853   GFRAA >60 05/05/2020 0853   Lipase  No results found for: LIPASE     Studies/Results: No results found.  Anti-infectives: Anti-infectives (From admission, onward)   Start     Dose/Rate Route Frequency Ordered Stop   03/16/20 2049  vancomycin (VANCOCIN) powder  Status:  Discontinued          As needed 03/16/20 2049  03/16/20 2357   03/16/20 0600  ceFAZolin (ANCEF) IVPB 2g/100 mL premix  Status:  Discontinued        2 g 200 mL/hr over 30 Minutes Intravenous On call to O.R. 03/15/20 1920 03/17/20 0020   03/12/20 1215  ceFAZolin (ANCEF) IVPB 2g/100 mL premix        2 g 200 mL/hr over 30 Minutes Intravenous On call to O.R. 03/12/20 1026 03/12/20 1833   03/11/20 2345  Ampicillin-Sulbactam (UNASYN) 3 g in sodium chloride 0.9 % 100 mL IVPB        3 g 200 mL/hr over 30 Minutes Intravenous Every 6 hours 03/11/20 2344 03/18/20 2359       Assessment/Plan PHBC7/14/21 TBI/F ICC/SDH/SAH/pneumocephalus- per Dr. Maisie Fus, completed Unasyn, Keppra, F/U CT H 7/15 expected enlargement ICCs, F/U CT H 7/17 no signif change, therapies R orbit FX/R maxillary sinus FX/skull base FXs- per Dr. Uvaldo Rising R humeral head FX dislocation/R clavicle FX/R coracoid FX- relocated in ED by Dr. August Saucer, reconstruction by Dr. August Saucer 7/20.NWB RUE,Cleared for increased ROM by Dr. August Saucer 8/18. L distal femur FX- S/P ORIF by Dr. Carola Frost 7/15, F/U films 8/19 L bimalleolar ankle FX- S/P ORIF by Dr. Carola Frost 7/15.WBATLLEin CAM Pelvic ring FX- S/P B SI pinning by Dr.  Handy 7/15.WBAT RLE L glenoid and scapula FXs- per Dr. Carola Frost R radial shaft FX- S/P ORIF by Dr. Carola Frost 7/15 L rib FX 2-4- pain control, pulm toilet ABL anemia- stable History of +COVIDupon admission- not symptomatic, off precautions per ID FEN-regular diet + supplementation per RD VTE- SCDs, LMWH Dispo-Transfer to the floor.Check labs. ContinueTBI team therapies, TOC team working on insurance approval/SNF placement. [place] CM[place] patient for placement in their facility.  LOS: 57 days    Juliet Rude , [facility] Surgery 05/07/2020, 8:58 AM Please see Amion for pager number during day hours 7:00am-4:30pm

## 2020-05-08 NOTE — Progress Notes (Signed)
[facility] Progress Note  53 Days Post-Op  Subjective: CC-  Sitting up in bed watching tv. No complaints. Spoke with admissions staff from [facility] yesterday.   Objective: Vital signs in last 24 hours: Temp:  [98.2 F (36.8 C)-98.5 F (36.9 C)] 98.2 F (36.8 C) (09/10 0809) Pulse Rate:  [58-86] 58 (09/10 0809) Resp:  [18-20] 18 (09/10 0809) BP: (102-111)/(69-80) 109/74 (09/10 0809) SpO2:  [99 %-100 %] 99 % (09/10 0809) Last BM Date: 05/04/20  Intake/Output from previous day: 09/09 0701 - 09/10 0700 In: 600 [P.O.:600] Out: 1400 [Urine:1400] Intake/Output this shift: No intake/output data recorded.  PE: Gen: Alert, NAD Pulm: rate and effort normal Abd: Soft, NT/ND, +BS MWU:XLKG to LLE Neuro: f/c. Oriented to place, time, self  Skin: no rashes noted, warm and dry   Lab Results:  No results for input(s): WBC, HGB, HCT, PLT in the last 72 hours. BMET No results for input(s): NA, K, CL, CO2, GLUCOSE, BUN, CREATININE, CALCIUM in the last 72 hours. PT/INR No results for input(s): LABPROT, INR in the last 72 hours. CMP     Component Value Date/Time   NA 138 05/05/2020 0853   K 4.0 05/05/2020 0853   CL 103 05/05/2020 0853   CO2 26 05/05/2020 0853   GLUCOSE 123 (H) 05/05/2020 0853   BUN 12 05/05/2020 0853   CREATININE 0.75 05/05/2020 0853   CALCIUM 9.6 05/05/2020 0853   PROT 6.0 (L) 03/12/2020 0126   ALBUMIN 3.6 03/12/2020 0126   AST 228 (H) 03/12/2020 0126   ALT 96 (H) 03/12/2020 0126   ALKPHOS 51 03/12/2020 0126   BILITOT 0.6 03/12/2020 0126   GFRNONAA >60 05/05/2020 0853   GFRAA >60 05/05/2020 0853   Lipase  No results found for: LIPASE     Studies/Results: No results found.  Anti-infectives: Anti-infectives (From admission, onward)   Start     Dose/Rate Route Frequency Ordered Stop   03/16/20 2049  vancomycin (VANCOCIN) powder  Status:  Discontinued          As needed 03/16/20 2049 03/16/20 2357   03/16/20 0600  ceFAZolin  (ANCEF) IVPB 2g/100 mL premix  Status:  Discontinued        2 g 200 mL/hr over 30 Minutes Intravenous On call to O.R. 03/15/20 1920 03/17/20 0020   03/12/20 1215  ceFAZolin (ANCEF) IVPB 2g/100 mL premix        2 g 200 mL/hr over 30 Minutes Intravenous On call to O.R. 03/12/20 1026 03/12/20 1833   03/11/20 2345  Ampicillin-Sulbactam (UNASYN) 3 g in sodium chloride 0.9 % 100 mL IVPB        3 g 200 mL/hr over 30 Minutes Intravenous Every 6 hours 03/11/20 2344 03/18/20 2359       Assessment/Plan PHBC7/14/21 TBI/F ICC/SDH/SAH/pneumocephalus- per Dr. Maisie Fus, completed Unasyn, Keppra, F/U CT H 7/15 expected enlargement ICCs, F/U CT H 7/17 no signif change, therapies R orbit FX/R maxillary sinus FX/skull base FXs- per Dr. Uvaldo Rising R humeral head FX dislocation/R clavicle FX/R coracoid FX- relocated in ED by Dr. August Saucer, reconstruction by Dr. August Saucer 7/20.NWB RUE,Cleared for increased ROM by Dr. August Saucer 8/18. L distal femur FX- S/P ORIF by Dr. Carola Frost 7/15, F/U films 8/19 L bimalleolar ankle FX- S/P ORIF by Dr. Carola Frost 7/15.WBATLLEin CAM Pelvic ring FX- S/P B SI pinning by Dr. Carola Frost 7/15.WBAT RLE L glenoid and scapula FXs- per Dr. Carola Frost R radial shaft FX- S/P ORIF by Dr. Carola Frost 7/15 L rib FX 2-4- pain control, pulm  toilet ABL anemia- stable History of +COVIDupon admission- not symptomatic, off precautions per ID FEN-regular diet + supplementation per RD VTE- SCDs, LMWH Dispo-Transfer to the floor.ContinueTBI team therapies. TOC team working on insurance approval/SNF placement - [facility] SNF may be interested in offering bed if patient/family agree.   LOS: 58 days    Franne Forts, [facility] Surgery 05/08/2020, 9:00 AM Please see Amion for pager number during day hours 7:00am-4:30pm

## 2020-05-08 NOTE — TOC Progression Note (Addendum)
Transition of Care [facility]) - Progression Note    Patient Details  Name: Anthony Macias MRN: [id number] Date of Birth: June 14, 1981  Transition [facility] Care [facility]) CM/SW Contact  Anthony Macias, [place] Phone Number: 05/08/2020, 12:57 PM  Clinical Narrative:  4:32pm- CSW received message from [place] stating that they are in agreement with [facility]. Pt will need authorization, I have left a voicemail for Joni Reining in admissions to start auth.     12:57pm- CSW called pt mother Anthony Macias at [phone]. CSW introduced self, role, reason for call. Provided contact at [facility] [place] (admissions) to mother as well as my numner. CSW encouraged her to let us know a decision by today so that [place] can [employer] authorization. CSW will f/u before end of business day if no answer.    Expected Discharge Plan: Skilled Nursing Facility Barriers to Discharge: Continued Medical Work up, Inadequate or no insurance  Expected Discharge Plan and Services Expected Discharge Plan: Skilled Nursing Facility Discharge Planning Services: CM Consult Living arrangements for the past 2 months: Homeless  Readmission Risk Interventions No flowsheet data found.
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
## 2020-05-09 NOTE — Progress Notes (Signed)
   Trauma/Critical Care Follow Up Note  Subjective:    Overnight Issues:   Objective:  Vital signs for last 24 hours: Temp:  [98 F (36.7 C)-99 F (37.2 C)] 98 F (36.7 C) (09/11 0826) Pulse Rate:  [64-84] 76 (09/11 0826) Resp:  [16-20] 20 (09/11 0826) BP: (98-114)/(64-80) 108/80 (09/11 0826) SpO2:  [98 %-100 %] 99 % (09/11 0826)  Hemodynamic parameters for last 24 hours:    Intake/Output from previous day: 09/10 0701 - 09/11 0700 In: 480 [P.O.:480] Out: 875 [Urine:875]  Intake/Output this shift: No intake/output data recorded.  Vent settings for last 24 hours:    Physical Exam:  Gen: comfortable, no distress Neuro: non-focal exam HEENT: PERRL Neck: supple CV: RRR Pulm: unlabored breathing Abd: soft, NT GU: clear yellow urine Extr: wwp, no edema   No results found for this or any previous visit (from the past 24 hour(s)).  Assessment & Plan:  Present on Admission: **None**    LOS: 59 days   Additional comments:I reviewed the patient's new clinical lab test results.   and I reviewed the patients new imaging test results.    PHBC7/14/21  TBI/F ICC/SDH/SAH/pneumocephalus- per Dr. Maisie Fus, completed Unasyn, Keppra, F/U CT H 7/15 expected enlargement ICCs, F/U CT H 7/17 no signif change, therapies R orbit FX/R maxillary sinus FX/skull base FXs- per Dr. Uvaldo Rising R humeral head FX dislocation/R clavicle FX/R coracoid FX- relocated in ED by Dr. August Saucer, reconstruction by Dr. August Saucer 7/20.NWB RUE,Cleared for increased ROM by Dr. August Saucer 8/18. L distal femur FX- S/P ORIF by Dr. Carola Frost 7/15, F/U films 8/19 L bimalleolar ankle FX- S/P ORIF by Dr. Carola Frost 7/15.WBATLLEin CAM Pelvic ring FX- S/P B SI pinning by Dr. Carola Frost 7/15.WBAT RLE L glenoid and scapula FXs- per Dr. Carola Frost R radial shaft FX- S/P ORIF by Dr. Carola Frost 7/15 L rib FX 2-4- pain control, pulm toilet ABL anemia- stable History of +COVIDupon admission- not symptomatic, off precautions per  ID FEN-regular diet + supplementation per RD VTE- SCDs, LMWH Dispo-Transfer to the floor.ContinueTBI team therapies. TOC team working on insurance approval/SNF placement - [facility] SNF may be interested in offering bed if patient/family agree.  Diamantina Monks, MD Trauma & General Surgery Please use [email or web address] to contact on call provider  05/09/2020  *Care during the described time interval was provided by me. I have reviewed this patient's available data, including medical history, events of note, physical examination and test results as part of my evaluation.

## 2020-05-09 NOTE — TOC Progression Note (Signed)
Transition of Care [facility]) - Progression Note    Patient Details  Name: Anthony Macias MRN: [id number] Date of Birth: [birth year]  [facility]) CM/SW Contact  Annalee Genta, LCSW Phone Number: 05/09/2020, 11:39 AM  Clinical Narrative:  CSW contacted admissions coordinator at [facility] Jeronimo Greaves) for update on insurance auth. Per coordinator they have not received the auth yet and was working with their administrators and [facility] regarding it. CSW provided Jeronimo Greaves his contact information and requested if they need anything from [place] or anything we can do to assist to reach out and let us know.     Expected Discharge Plan: Skilled Nursing Facility Barriers to Discharge: Continued Medical Work up, Inadequate or no insurance  Expected Discharge Plan and Services Expected Discharge Plan: Skilled Nursing Facility   Discharge Planning Services: CM Consult   Living arrangements for the past 2 months: Homeless                                       Social Determinants of Health (SDOH) Interventions    Readmission Risk Interventions No flowsheet data found.

## 2020-05-10 NOTE — Progress Notes (Signed)
Trauma Service Note  Chief Complaint/Subjective: Feels joints are achy, no other complaints, tolerating diet  Objective: Vital signs in last 24 hours: Temp:  [97.5 F (36.4 C)-98.9 F (37.2 C)] 97.5 F (36.4 C) (09/12 0630) Pulse Rate:  [77-98] 91 (09/11 2119) Resp:  [16-20] 16 (09/12 0630) BP: (106-139)/(58-92) 120/84 (09/12 0630) SpO2:  [99 %-100 %] 99 % (09/12 0630) Last BM Date: 05/09/20  Intake/Output from previous day: 09/11 0701 - 09/12 0700 In: 240 [P.O.:240] Out: 975 [Urine:975] Intake/Output this shift: No intake/output data recorded.  General: NAD  Lungs: nonlabored  Abd: soft, NT, ND  Extremities: no edema  Neuro: alert, answers questions appropriate, GCS 15  Lab Results: CBC  No results for input(s): WBC, HGB, HCT, PLT in the last 72 hours. BMET No results for input(s): NA, K, CL, CO2, GLUCOSE, BUN, CREATININE, CALCIUM in the last 72 hours. PT/INR No results for input(s): LABPROT, INR in the last 72 hours. ABG No results for input(s): PHART, HCO3 in the last 72 hours.  Invalid input(s): PCO2, PO2  Studies/Results: No results found.  Anti-infectives: Anti-infectives (From admission, onward)   Start     Dose/Rate Route Frequency Ordered Stop   03/16/20 2049  vancomycin (VANCOCIN) powder  Status:  Discontinued          As needed 03/16/20 2049 03/16/20 2357   03/16/20 0600  ceFAZolin (ANCEF) IVPB 2g/100 mL premix  Status:  Discontinued        2 g 200 mL/hr over 30 Minutes Intravenous On call to O.R. 03/15/20 1920 03/17/20 0020   03/12/20 1215  ceFAZolin (ANCEF) IVPB 2g/100 mL premix        2 g 200 mL/hr over 30 Minutes Intravenous On call to O.R. 03/12/20 1026 03/12/20 1833   03/11/20 2345  Ampicillin-Sulbactam (UNASYN) 3 g in sodium chloride 0.9 % 100 mL IVPB        3 g 200 mL/hr over 30 Minutes Intravenous Every 6 hours 03/11/20 2344 03/18/20 2359      Medications Scheduled Meds: . (feeding supplement) PROSource Plus  30 mL Oral TID BM   . chlorhexidine gluconate (MEDLINE KIT)  15 mL Mouth Rinse BID  . docusate sodium  100 mg Oral BID  . enoxaparin (LOVENOX) injection  40 mg Subcutaneous Q24H  . feeding supplement (ENSURE ENLIVE)  237 mL Oral TID BM  . pantoprazole  40 mg Oral Daily   Continuous Infusions: PRN Meds:.acetaminophen, ondansetron **OR** ondansetron (ZOFRAN) IV  Assessment/Plan: s/p Procedure(s): OPEN REDUCTION INTERNAL FIXATION RIGHT PROXIMAL HUMEROUS FRACTURE AND CLAVICAL FRACTURE WITH BICEPS TENODESIS  PHBC7/14/21  TBI/F ICC/SDH/SAH/pneumocephalus- per Dr. Marcello Moores, completed Unasyn, Keppra, F/U CT H 7/15 expected enlargement ICCs, F/U CT H 7/17 no signif change, therapies R orbit FX/R maxillary sinus FX/skull base FXs- per Dr. Leonides Schanz R humeral head FX dislocation/R clavicle FX/R coracoid FX- relocated in ED by Dr. Marlou Sa, reconstruction by Dr. Marlou Sa 7/20.NWB RUE,Cleared for increased ROM by Dr. Marlou Sa 8/18. L distal femur FX- S/P ORIF by Dr. Marcelino Scot 7/15, F/U films 8/19 L bimalleolar ankle FX- S/P ORIF by Dr. Marcelino Scot 7/15.WBATLLEin CAM Pelvic ring FX- S/P B SI pinning by Dr. Marcelino Scot 7/15.WBAT RLE L glenoid and scapula FXs- per Dr. Marcelino Scot R radial shaft FX- S/P ORIF by Dr. Marcelino Scot 7/15 L rib FX 2-4- pain control, pulm toilet ABL anemia- stable History of +COVIDupon admission- not symptomatic, off precautions per ID FEN-regular diet + supplementation per RD VTE- SCDs, LMWH Dispo-ContinueTBI team therapies.TOC team working on insurance approval/SNF placement -  [facility] SNF may be interested in offering bed if patient/family agree.   LOS: 60 days   [place] Trauma Surgeon [phone] Surgery 05/10/2020

## 2020-05-11 ENCOUNTER — Encounter (HOSPITAL_COMMUNITY): Payer: Self-pay

## 2020-05-11 LAB — SARS CORONAVIRUS 2 (TAT 6-24 HRS): SARS Coronavirus 2: NEGATIVE

## 2020-05-11 NOTE — Plan of Care (Signed)

## 2020-05-11 NOTE — Discharge Instructions (Addendum)
-Please offer covid vaccination if patient agreeable.   Weightbear as tolerated R upper extremity, ok for active and passive range of motion as tolerated Unrestricted ROM R elbow, forearm, wrist and hand Weightbear as tolerated L upper extremity   No ROM restrictions Weightbear as tolerated Left lower extremity   No ROM restrictions   Aggressive knee and ankle ROM   DC CAM boot  Weightbear as tolerated R lower extremity   No ROM restrictions    Living With Traumatic Brain Injury Traumatic brain injury (TBI) is an injury to the brain that may be mild, moderate, or severe. Symptoms of any type of TBI can be long lasting (chronic). Depending on the area of the brain that is affected, a TBI can interfere with vision, memory, concentration, speech, balance, sense of touch, and sleep. TBI can also cause chronic symptoms like headache or dizziness. How to cope with lifestyle changes After a TBI, you may need to make changes to your lifestyle in order to recover as well as possible. How quickly and how fully you recover will depend on the severity of your injury. Your recovery plan may involve:  Working with specialists to develop a rehabilitation plan to help you return to your regular activities. Your health care team may include: ? Physical or occupational therapists. ? Speech and language pathologists. ? Mental health counselors. ? Physicians like your primary care physician or neurologist.  Taking time off work or school, depending on your injury.  Avoiding situations where there is a risk for another head injury, such as football, hockey, soccer, basketball, martial arts, downhill snow sports, and horseback riding. Do not do these activities until your health care provider approves.  Resting. Rest helps the brain to heal. Make sure you: ? Get plenty of sleep at night. Avoid staying up late at night. ? Keep the same bedtime hours on weekends and weekdays. ? Rest during the day. Take  daytime naps or rest breaks when you feel tired.  Avoiding extra stress on your eyes. You may need to set time limits when working on the computer, watching TV, and reading.  Finding ways to manage stress. This may include: ? Avoiding activities that cause stress. ? Deep breathing, yoga, or meditation. ? Listening to music or spending time outdoors.  Making lists, setting reminders, or using a day planner to help your memory.  Allowing yourself plenty of time to complete everyday tasks, such as grocery shopping, paying bills, and doing laundry.  Avoiding driving. Your ability to drive safely may be affected by your injury. ? Rely on family, friends, or a transportation service to help you get around and to appointments. ? Have a professional evaluation to check your driving ability. ? Access support services to help you return to driving. These may include training and adaptive equipment. Follow these instructions at home:  Take over-the-counter and prescription medicines only as told by your health care provider. Do not take aspirin or other anti-inflammatory medicines such as ibuprofen or naproxen unless approved by your health care provider.  Avoid large amounts of caffeine. Your body may be more sensitive to it after your injury.  Do not use any products that contain nicotine or tobacco, such as cigarettes, e-cigarettes, nicotine gum, and patches. If you need help quitting, ask your health care provider.  Do not use drugs.  Limit alcohol intake to no more than 1 drink per day for nonpregnant women and 2 drinks per day for men. One drink equals 12 ounces  of beer, 5 ounces of wine, or 1 ounces of hard liquor.  Do not drive until cleared by your health care provider.  Keep all follow-up visits as told by your health care provider. This is important. Where to find support  Talk with your employer, co-workers, teachers, or school counselor about your injury. Work together to develop  a plan for completing tasks while you recover.  Talk to others living with a TBI. Join a support group with other people who have experienced a TBI.  Let your friends and family members know what they can do to help. This might include helping at home or transportation to appointments.  If you are unable to continue working after your injury, talk to a Child psychotherapist about options to help you meet your financial needs.  Seek out additional resources if you are a Building services engineer or family member, such as: ? Development worker, international aid Injury Center: dvbic.dcoe.mil ? Department of [employer]: [phone] Questions to ask your health care provider:  How serious is my injury?  What is my rehabilitation plan?  What is my expected recovery?  When can I return to work or school?  When can I return to regular activities, including driving? Contact a health care provider if:  You have new or worsening: ? Dizziness. ? Headache. ? Anxiety or depression. ? Irritability. ? Confusion. ? Jerky movements that you cannot control (seizures). ? Extreme sensitivity to light or sound. ? Nausea or vomiting. Summary  Traumatic brain injury (TBI) is an injury to your brain that can interfere with vision, memory, concentration, speech, balance, sense of touch, and sleep. TBI can also cause chronic symptoms like headache or dizziness.  After a TBI you may need to make several changes to your lifestyle in order to recover as well as possible. How quickly and how fully you recover will depend on the severity of your injury.  Talk to your family, friends, employer, co-workers, Architectural technologist, or school counselor about your injury. Work together to develop a plan for completing tasks while you recover. This information is not intended to replace advice given to you by your health care provider. Make sure you discuss any questions you have with your health care  provider. Document Revised: 12/07/2018 Document Reviewed: 08/11/2016 Elsevier Patient Education  2020 [employer].

## 2020-05-11 NOTE — Plan of Care (Signed)
  Problem: Pain Managment: Goal: General experience of comfort will improve Outcome: Progressing   Problem: Safety: Goal: Ability to remain free from injury will improve Outcome: Progressing   Problem: Skin Integrity: Goal: Risk for impaired skin integrity will decrease Outcome: Progressing   

## 2020-05-11 NOTE — Consult Note (Signed)
   [facility] CM Inpatient Consult   05/11/2020  Dagen Beevers 1981/06/04 [id number]   [facility] [[facility]]  Accountable Care Organization [ACO] Patient:  [facility]   Patient screened for post hospitalization/post skilled nursing rehab needs.  Review of patient's medical record reveals patient has been awaiting skilled nursing for rehab. Reviewed inpatient [facility] team notes since 05/06/20 when found that this patient was listed as having [facility] per Raynelle Fanning.   Primary Care Provider: No primary care provider noted in medical record.  Plan:  Discussion with [facility] Multidisciplinary team case discussion for ongoing follow up needs,as appropriate. Continue to follow with transition of care team.  For questions contact:   Charlesetta Shanks, RN BSN CCM [facility]  [phone] business mobile phone Toll free office [phone]  Fax number: [phone] [email or web address]

## 2020-05-11 NOTE — TOC Progression Note (Signed)
Transition of Care [facility]) - Progression Note    Patient Details  Name: Anthony Macias MRN: [id number] Date of Birth: 1981/06/18  [facility]) CM/SW Contact  Epifanio Lesches, RN Phone Number: [phone] 05/11/2020, 11:42 AM  Clinical Narrative:    NCM received call from [facility] admission liaison, Joni Reining ( [phone]). Joni Reining made NCM aware they have received authorization for SNF and bed will be available, 05/12/2020. Pt will need updated COVID, MD and nurse made aware.....  TOC team will continue to monitor and follow.....   Expected Discharge Plan: Skilled Nursing Facility Barriers to Discharge: No SNF bed  Expected Discharge Plan and Services Expected Discharge Plan: Skilled Nursing Facility   Discharge Planning Services: CM Consult   Living arrangements for the past 2 months: Homeless                                       Social Determinants of Health (SDOH) Interventions    Readmission Risk Interventions No flowsheet data found.

## 2020-05-11 NOTE — Progress Notes (Signed)
[facility] Progress Note  56 Days Post-Op  Subjective: CC-  No complaints. States that he's not a very emotional person, he's not excited or sad about rehab but will go if it's what's good for him.  Objective: Vital signs in last 24 hours: Temp:  [97.7 F (36.5 C)-98.6 F (37 C)] 98.5 F (36.9 C) (09/13 0754) Pulse Rate:  [61-65] 65 (09/13 0754) Resp:  [14-18] 14 (09/13 0754) BP: (111-123)/(74-84) 115/74 (09/13 0754) SpO2:  [100 %] 100 % (09/13 0754) Last BM Date: 05/09/20  Intake/Output from previous day: 09/12 0701 - 09/13 0700 In: 240 [P.O.:240] Out: 1650 [Urine:1650] Intake/Output this shift: No intake/output data recorded.  PE: Gen: Alert, NAD Pulm: rate and effort normal Abd: Soft, NT/ND, +BS MWN:UUVO to LLE Neuro: f/c. Oriented to place, time, self  Skin: no rashes noted, warm and dry    Lab Results:  No results for input(s): WBC, HGB, HCT, PLT in the last 72 hours. BMET No results for input(s): NA, K, CL, CO2, GLUCOSE, BUN, CREATININE, CALCIUM in the last 72 hours. PT/INR No results for input(s): LABPROT, INR in the last 72 hours. CMP     Component Value Date/Time   NA 138 05/05/2020 0853   K 4.0 05/05/2020 0853   CL 103 05/05/2020 0853   CO2 26 05/05/2020 0853   GLUCOSE 123 (H) 05/05/2020 0853   BUN 12 05/05/2020 0853   CREATININE 0.75 05/05/2020 0853   CALCIUM 9.6 05/05/2020 0853   PROT 6.0 (L) 03/12/2020 0126   ALBUMIN 3.6 03/12/2020 0126   AST 228 (H) 03/12/2020 0126   ALT 96 (H) 03/12/2020 0126   ALKPHOS 51 03/12/2020 0126   BILITOT 0.6 03/12/2020 0126   GFRNONAA >60 05/05/2020 0853   GFRAA >60 05/05/2020 0853   Lipase  No results found for: LIPASE     Studies/Results: No results found.  Anti-infectives: Anti-infectives (From admission, onward)   Start     Dose/Rate Route Frequency Ordered Stop   03/16/20 2049  vancomycin (VANCOCIN) powder  Status:  Discontinued          As needed 03/16/20 2049 03/16/20 2357    03/16/20 0600  ceFAZolin (ANCEF) IVPB 2g/100 mL premix  Status:  Discontinued        2 g 200 mL/hr over 30 Minutes Intravenous On call to O.R. 03/15/20 1920 03/17/20 0020   03/12/20 1215  ceFAZolin (ANCEF) IVPB 2g/100 mL premix        2 g 200 mL/hr over 30 Minutes Intravenous On call to O.R. 03/12/20 1026 03/12/20 1833   03/11/20 2345  Ampicillin-Sulbactam (UNASYN) 3 g in sodium chloride 0.9 % 100 mL IVPB        3 g 200 mL/hr over 30 Minutes Intravenous Every 6 hours 03/11/20 2344 03/18/20 2359       Assessment/Plan PHBC7/14/21 TBI/F ICC/SDH/SAH/pneumocephalus- per Dr. Maisie Fus, completed Unasyn, Keppra, F/U CT H 7/15 expected enlargement ICCs, F/U CT H 7/17 no signif change, therapies R orbit FX/R maxillary sinus FX/skull base FXs- per Dr. Uvaldo Rising R humeral head FX dislocation/R clavicle FX/R coracoid FX- relocated in ED by Dr. August Saucer, reconstruction by Dr. August Saucer 7/20.NWB RUE,Cleared for increased ROM by Dr. August Saucer 8/18. L distal femur FX- S/P ORIF by Dr. Carola Frost 7/15, F/U films 8/19 L bimalleolar ankle FX- S/P ORIF by Dr. Carola Frost 7/15.WBATLLEin CAM Pelvic ring FX- S/P B SI pinning by Dr. Carola Frost 7/15.WBAT RLE L glenoid and scapula FXs- per Dr. Carola Frost R radial shaft FX- S/P ORIF by Dr.  Handy 7/15 L rib FX 2-4- pain control, pulm toilet ABL anemia- stable History of +COVIDupon admission- not symptomatic, off precautions per ID FEN-regular diet + supplementation per RD VTE- SCDs, LMWH Dispo-ContinueTBI team therapies. Awaiting insurance authorization for [facility].   LOS: 61 days    Franne Forts, [facility] Surgery 05/11/2020, 10:51 AM Please see Amion for pager number during day hours 7:00am-4:30pm

## 2020-05-12 ENCOUNTER — Inpatient Hospital Stay (HOSPITAL_COMMUNITY): Payer: 59

## 2020-05-12 ENCOUNTER — Inpatient Hospital Stay: Payer: 59

## 2020-05-12 MED ORDER — PROSOURCE PLUS PO LIQD
30.0000 mL | Freq: Three times a day (TID) | ORAL | Status: DC
Start: 2020-05-12 — End: 2024-04-08

## 2020-05-12 MED ORDER — DOCUSATE SODIUM 100 MG PO CAPS: 100.0000 mg | ORAL_CAPSULE | Freq: Two times a day (BID) | ORAL | 0 refills | Status: DC

## 2020-05-12 MED ORDER — PANTOPRAZOLE SODIUM 40 MG PO TBEC: 40.0000 mg | DELAYED_RELEASE_TABLET | Freq: Every day | ORAL | Status: DC

## 2020-05-12 MED ORDER — ENSURE ENLIVE PO LIQD: 237.0000 mL | Freq: Three times a day (TID) | ORAL | 12 refills | Status: DC

## 2020-05-12 MED ORDER — ACETAMINOPHEN 325 MG PO TABS: 650.0000 mg | ORAL_TABLET | Freq: Four times a day (QID) | ORAL | Status: DC | PRN

## 2020-05-12 NOTE — Progress Notes (Signed)
Nutrition Follow-up  DOCUMENTATION CODES:   Not applicable  INTERVENTION:   -Continue Ensure Enlive po TID, each supplement provides 350 kcal and 20 grams of protein -Continue 30 ml Prosource Plus TID, each supplement provides 100 kcals and 15 grams protein  NUTRITION DIAGNOSIS:   Increased nutrient needs related to wound healing as evidenced by estimated needs.  Ongoing  GOAL:   Patient will meet greater than or equal to 90% of their needs  Progressing   MONITOR:   PO intake, Supplement acceptance, Skin, Weight trends, Labs, I & O's  REASON FOR ASSESSMENT:   Consult, Ventilator Enteral/tube feeding initiation and management  ASSESSMENT:   [facility] admitted with TBI, SDH, SAH, pneumocephalus, ICC, R orbit fx, R maxillary sinus fx, skull base fxs, R humeral head fx dislocation, R clavicle fx, R coracoid fx, L distal femur fx s/p ORIF 7/15, L ankle fx s/p ORIF 7/15, pelvic ring fx s/p pinning 7/15, L scapula fxs, R radial shaft fx s/p ORIF 7/15, and L rib fx 2-4. Pt covid + on admission.  Reviewed I/O's: +280 ml x 24 hours and -5.8L since 04/28/20  UOP: 200 ml x 24 hours  Pt receiving nursing care at time of visit.   Noted breakfast tray on counter- pt consumed 100%. Per doc flowsheets, appetite continues to be good. Noted meal completion 100%. Per MAR, pt has been refusing Ensure and Prosource Plus supplements. He has been refusing other medications over the past few days per [facility].   Per TOC notes, plan to d/c to SNF today.   Labs reviewed.   Diet Order:  Diet Order            Diet regular Room service appropriate? Yes; Fluid consistency: Thin  Diet effective now                 EDUCATION NEEDS:   No education needs have been identified at this time  Skin:  Skin Assessment: Skin Integrity Issues: Skin Integrity Issues:: Incisions Incisions: R arm, L leg, L thigh, L hip Other: N/A  Last BM:  05/09/20  Height:   Ht Readings from Last 1 Encounters:   03/22/20 5\' 11"  (1.803 m)    Weight:   Wt Readings from Last 1 Encounters:  05/02/20 66 kg    Ideal Body Weight:  80.9 kg  BMI:  Body mass index is 20.29 kg/m.  Estimated Nutritional Needs:   Kcal:  2200-2400  Protein:  135-160 grams  Fluid:  2 L/day    07/02/20, RD, LDN, CDCES Registered Dietitian II Certified Diabetes Care and Education Specialist Please refer to [facility] for RD and/or RD on-call/weekend/after hours pager

## 2020-05-12 NOTE — Plan of Care (Signed)
  Problem: Pain Managment: Goal: General experience of comfort will improve Outcome: Progressing   Problem: Skin Integrity: Goal: Risk for impaired skin integrity will decrease Outcome: Progressing   Problem: Safety: Goal: Ability to remain free from injury will improve Outcome: Progressing   

## 2020-05-12 NOTE — Progress Notes (Signed)
Orthopedic Trauma Service  Battery of xrays 2 months post op for numerous fractures looks great!!!  WBAT L upper extremity   No ROM restrictions WBAT Left lower extremity   No ROM restrictions   Aggressive knee and ankle ROM   DC CAM boot  WBAT R lower extremity   No ROM restrictions   R forearm fracture is united, WB status deferred to Dr. August Saucer who repaired the R proximal humerus  Pt can WB thru R forearm form our standpoint once cleared by Dr. August Saucer to [facility] thru R shoulder   Follow up with ortho trauma service in 3-4 weeks   Mearl Latin, PA-C [phone] (C) 05/12/2020, 1:05 PM  [facility] [address] [place] [address] [phone] Collier Bullock (F)

## 2020-05-12 NOTE — Progress Notes (Signed)
Pt report given to Linda;receiving RN for [facility]

## 2020-05-12 NOTE — Progress Notes (Signed)
[facility] transported pt to [facility]; pt in stable condition.
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
## 2020-05-12 NOTE — TOC Transition Note (Signed)
Transition of Care [facility]) - CM/SW Discharge Note   Patient Details  Name: Kaidon Kinker MRN: [id number] Date of Birth: 1980-09-29  Transition of Care [facility]) CM/SW Contact:  Epifanio Lesches, RN Phone Number: [phone] 05/12/2020, 12:19 PM   Clinical Narrative:    Patient will DC to: [facility] Anticipated DC date: 05/12/2020 Family notified: mom Transport by: Sharin Mons   Per MD patient ready for DC today to [facility]. RN, patient, patient's family, and facility notified of DC. Discharge Summary and FL2 sent to facility. RN to call report prior to discharge [phone]). rm # [phone]. DC packet on chart. Ambulance transport requested for patient.   Per [employer]. Admission liaison Joni Reining they will ensure pt receives COVID vaccine with pt consent.  RNCM will sign off for now as intervention is no longer needed. Please consult [place] again if new needs arise.    Final next level of care: Skilled Nursing Facility Barriers to Discharge: No Barriers Identified   Patient Goals and CMS Choice        Discharge Placement                       Discharge Plan and Services   Discharge Planning Services: CM Consult                                 Social Determinants of Health (SDOH) Interventions     Readmission Risk Interventions No flowsheet data found.

## 2020-05-12 NOTE — Progress Notes (Signed)
Okay to weight-bear as tolerated through the right shoulder.  He will need daily occupational therapy and physical therapy for right shoulder range of motion.  Follow-up with [place] in 2 weeks.  Thanks

## 2020-05-13 ENCOUNTER — Telehealth: Payer: Self-pay | Admitting: Orthopedic Surgery

## 2020-05-13 NOTE — Telephone Encounter (Signed)
Psychiatric nurse (PT) called from [facility] called requesting a call back. Lorin Picket stated he has questions about this patient condition of his right arm. Lorin Picket has several questions. Please call Lorin Picket back as soon as possible on his cell at [phone].

## 2020-05-13 NOTE — Telephone Encounter (Signed)
IC advised we needed to have patient come in for repeat evaluation to determine WTB status on UE. They will make sure patient gets a follow up appointment scheduled with Dr August Saucer.

## 2020-05-14 ENCOUNTER — Other Ambulatory Visit: Payer: Self-pay

## 2020-05-14 NOTE — Consult Note (Signed)
   [facility] CM Inpatient Consult   05/14/2020  Anthony Macias Jun 15, 1981 [id number]   [facility] [[facility]]  Accountable Care Organization [ACO] Patient:  [facility]  Patient was discussed in our multidisciplinary case discussion with the Quality Team and [facility] Team regarding patient's current transition of care and ongoing potential transitional needs for next level of care.  Patient transitioned to [facility] on 05/12/20 and needs for Medicaid discussed.  Will sign off  Charlesetta Shanks, RN BSN CCM [facility]  [phone] business mobile phone Toll free office [phone]  Fax number: [phone] [email or web address]

## 2020-05-27 ENCOUNTER — Ambulatory Visit: Payer: Self-pay

## 2020-05-27 ENCOUNTER — Ambulatory Visit (INDEPENDENT_AMBULATORY_CARE_PROVIDER_SITE_OTHER): Payer: 59 | Admitting: Orthopedic Surgery

## 2020-05-27 DIAGNOSIS — M25511 Pain in right shoulder: Secondary | ICD-10-CM

## 2020-05-31 ENCOUNTER — Encounter: Payer: Self-pay | Admitting: Orthopedic Surgery
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
## 2020-06-01 NOTE — Progress Notes (Signed)
Placed referral up front for Anthony Macias with [facility]

## 2020-06-24 ENCOUNTER — Ambulatory Visit: Payer: 59 | Admitting: Orthopedic Surgery

## 2020-07-15 ENCOUNTER — Telehealth: Payer: Self-pay

## 2020-07-15 NOTE — Telephone Encounter (Signed)
IC verbal order given.  °

## 2020-07-15 NOTE — Telephone Encounter (Signed)
Clydie Braun from [facility] stated patient doesn't have a pcp and wants to know if Dr.Dean can sign home health and nursing orders until pt finds a pcp. Karens nursing frequency 1w1 (yesterday),2w2w,1w3w patients mom is requesting speech therapy as well. CB [phone]

## 2020-07-15 NOTE — Telephone Encounter (Signed)
Ok for this until find pcp thx

## 2020-07-15 NOTE — Telephone Encounter (Signed)
Please advise. Thanks.  

## 2020-07-16 ENCOUNTER — Telehealth: Payer: Self-pay | Admitting: Orthopedic Surgery

## 2020-07-16 NOTE — Telephone Encounter (Signed)
Received call from Dorian (PT) with [facility] needing HHPT orders for  1 Wk 1, 2 Wk 3 and 1 Wk 3. The number to contact Dorian is [phone]

## 2020-07-16 NOTE — Telephone Encounter (Signed)
IC verbal given.  

## 2020-07-21 ENCOUNTER — Telehealth: Payer: Self-pay

## 2020-07-22 ENCOUNTER — Telehealth: Payer: Self-pay

## 2020-07-22 NOTE — Telephone Encounter (Signed)
No ROM restrictions. Okay to use heat

## 2020-07-22 NOTE — Telephone Encounter (Signed)
Tried calling to advise per below. No answer. LMVM with instructions per Franky Macho.

## 2020-07-22 NOTE — Telephone Encounter (Signed)
Please advise. Thanks.  

## 2020-07-22 NOTE — Telephone Encounter (Signed)
Anthony Macias from [facility] is requesting verbal orders for movement of shoulder and the use of heat on patienst shoulder CB:[phone]

## 2020-07-28 ENCOUNTER — Telehealth: Payer: Self-pay | Admitting: Radiology

## 2020-07-28 NOTE — Telephone Encounter (Signed)
I tried to call pt there is no number for him, I called the Emergency # for Chase Picket, he states that I need to call Elroy Channel [phone], which there was no answer and the VM has not been sent up, I called Sondra back and advised her of Leane Call message and she will get it to him.

## 2020-07-28 NOTE — Telephone Encounter (Signed)
He is 4-5 months out from procedure, needs apptment before any pain medicine RX

## 2020-07-28 NOTE — Telephone Encounter (Signed)
Per Anthony Macias with [facility], she states that he is in a lot of pain and he is only taking tylenol and it is not helping with his pain.  [name] wants to know if we can rx him something little stronger for pain.  He has not been seen since 04/2020 and he doesn't have another appt to follow up either.

## 2020-07-30 ENCOUNTER — Telehealth: Payer: Self-pay | Admitting: Orthopedic Surgery

## 2020-07-30 NOTE — Telephone Encounter (Signed)
No call back needed. Rick (OT) called from [email or web address]  stated pt missed ot session today. Raiford Noble stated pt primamry dr wanted OT sessionb missed today.

## 2020-07-30 NOTE — Telephone Encounter (Signed)
FYI

## 2021-07-18 IMAGING — RF DG ANKLE COMPLETE 3+V*L*
1 series · 7 of 7 positions shown · non-contrast
Comparison: March 11, 2020

CLINICAL DATA: ORIF

EXAM:
LEFT ANKLE COMPLETE - 3+ VIEW

[Series 1: run · 7 of 7 slices shown]
[im 1/7]
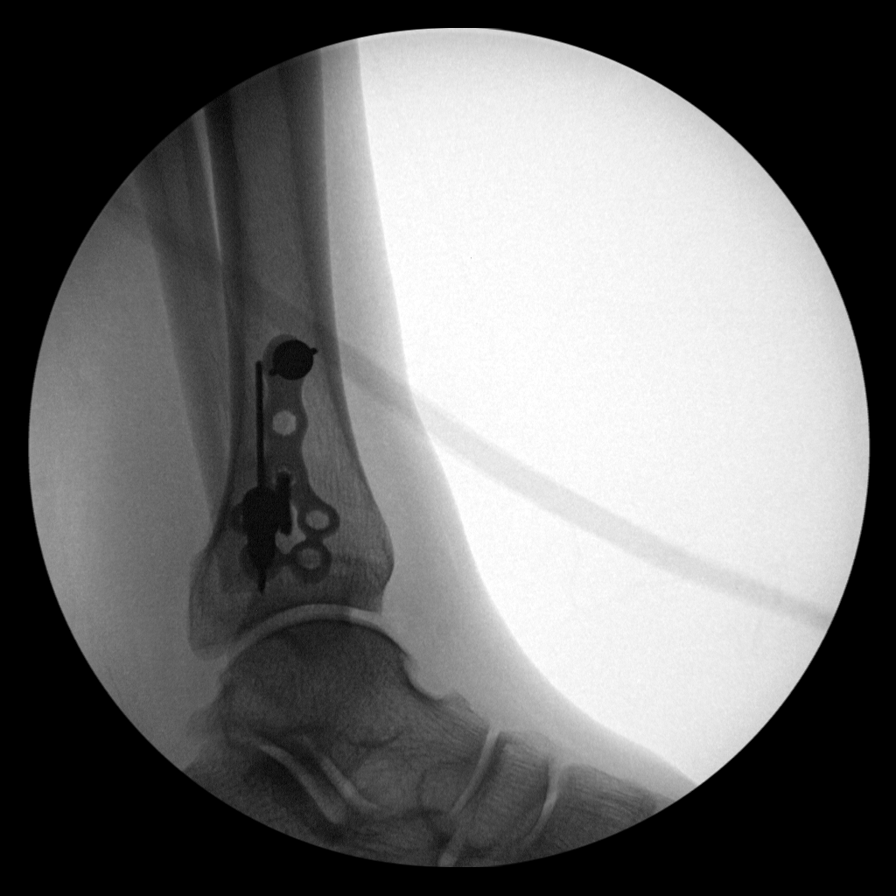
[im 2/7]
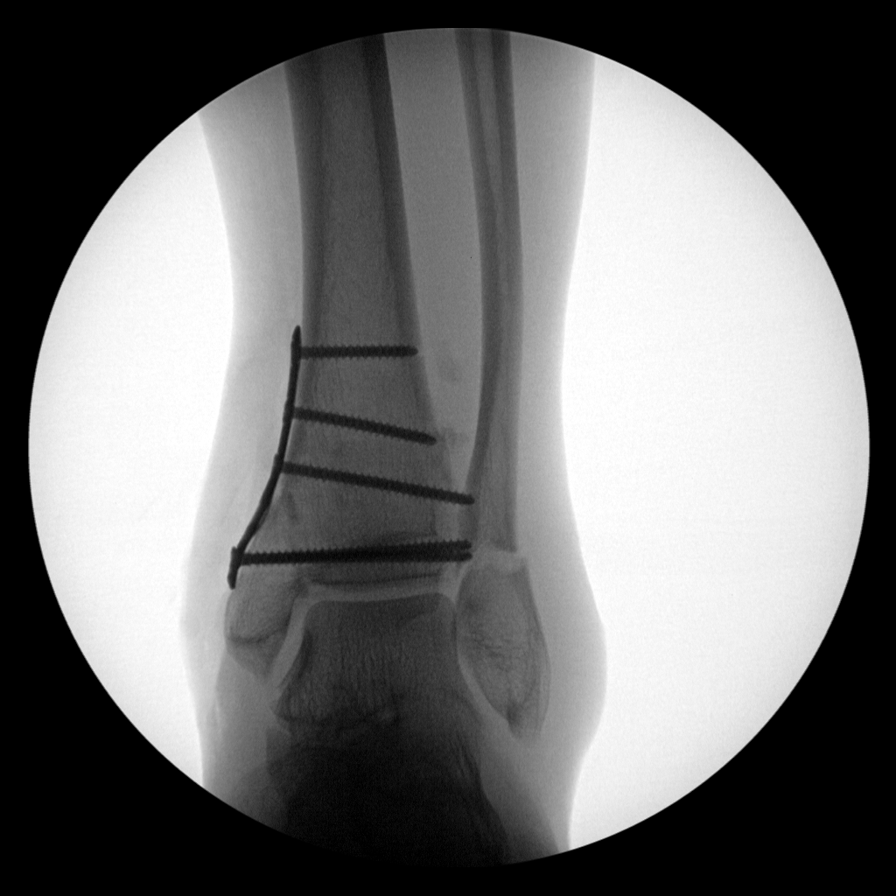
[im 3/7]
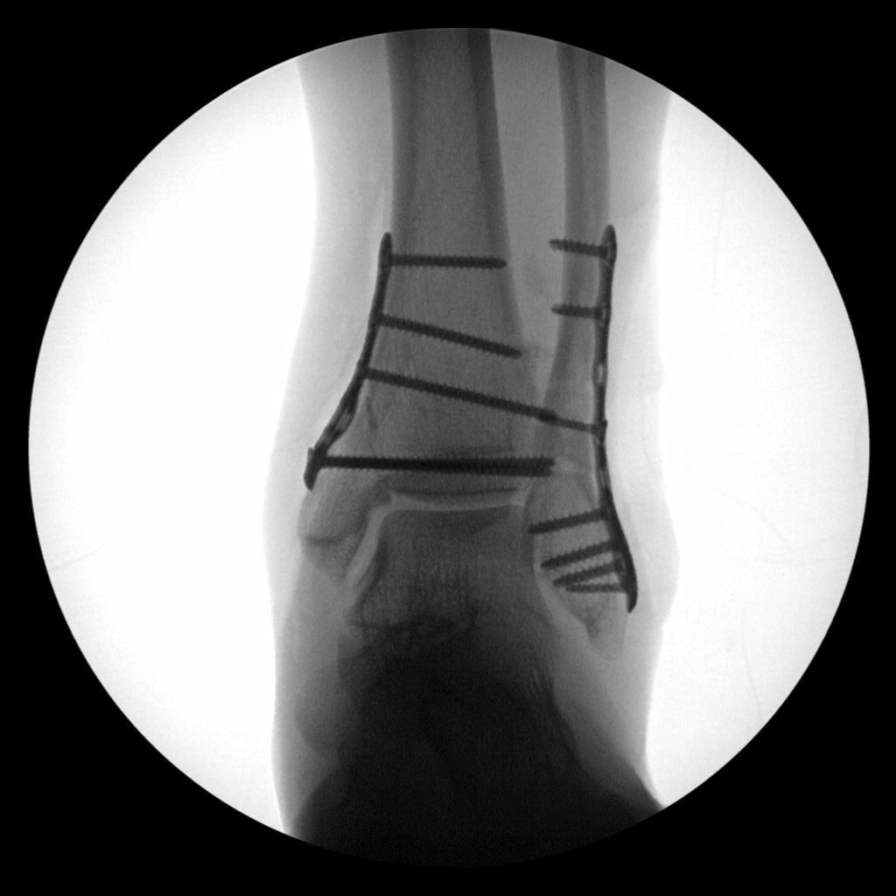
[im 4/7]
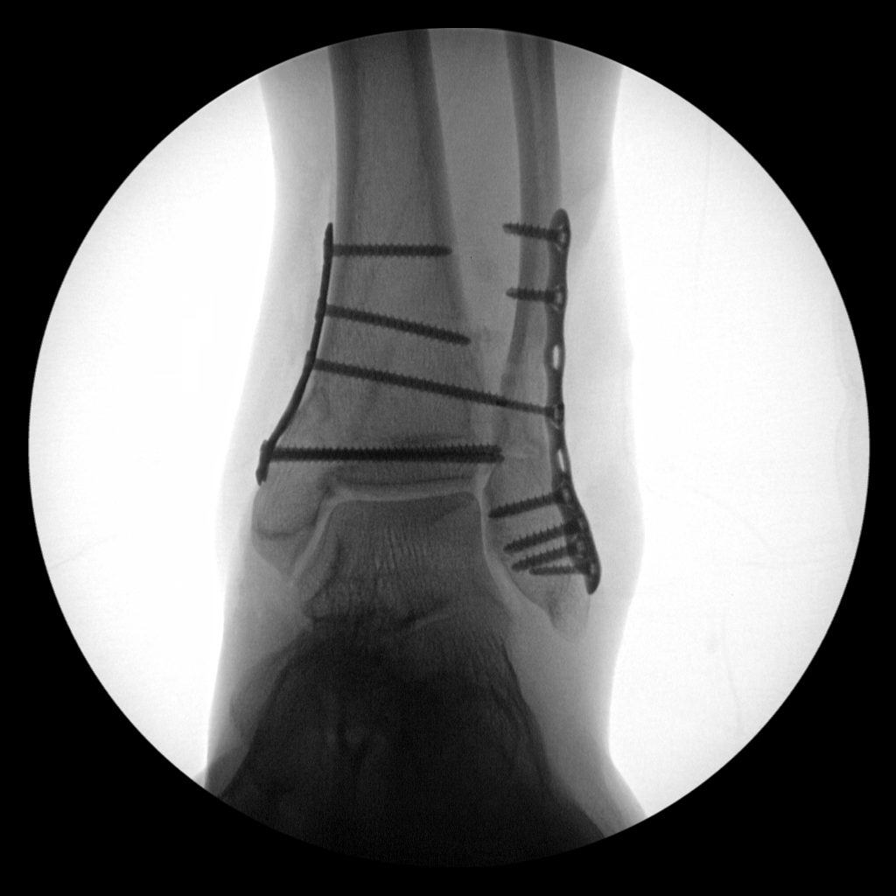
[im 5/7]
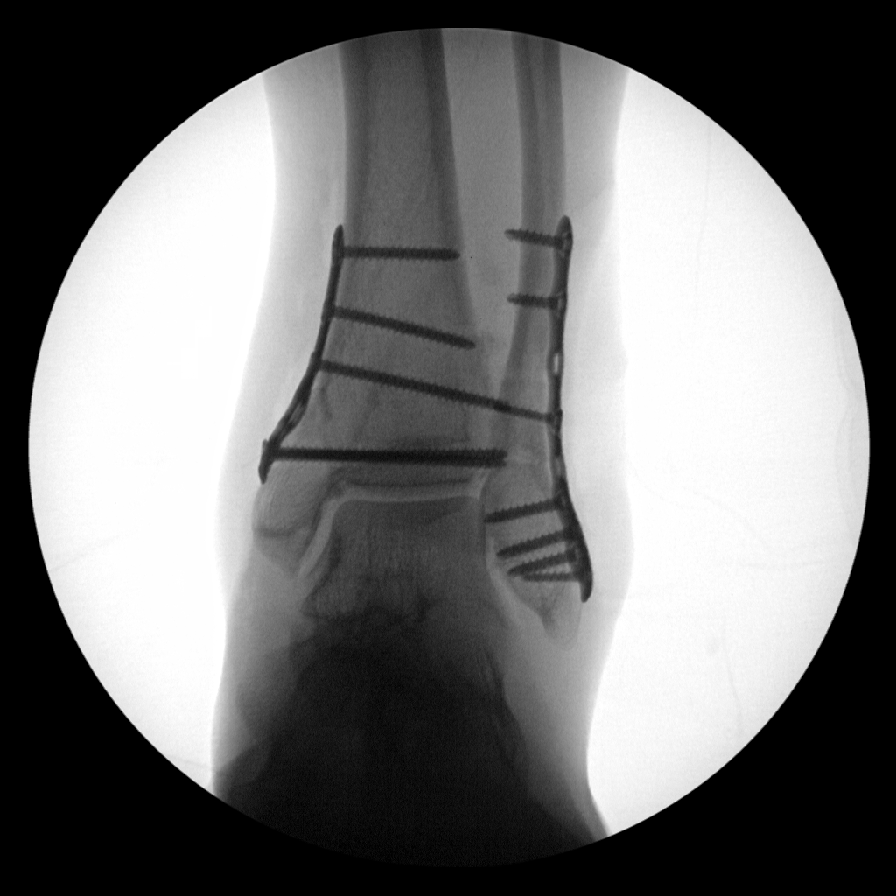
[im 6/7]
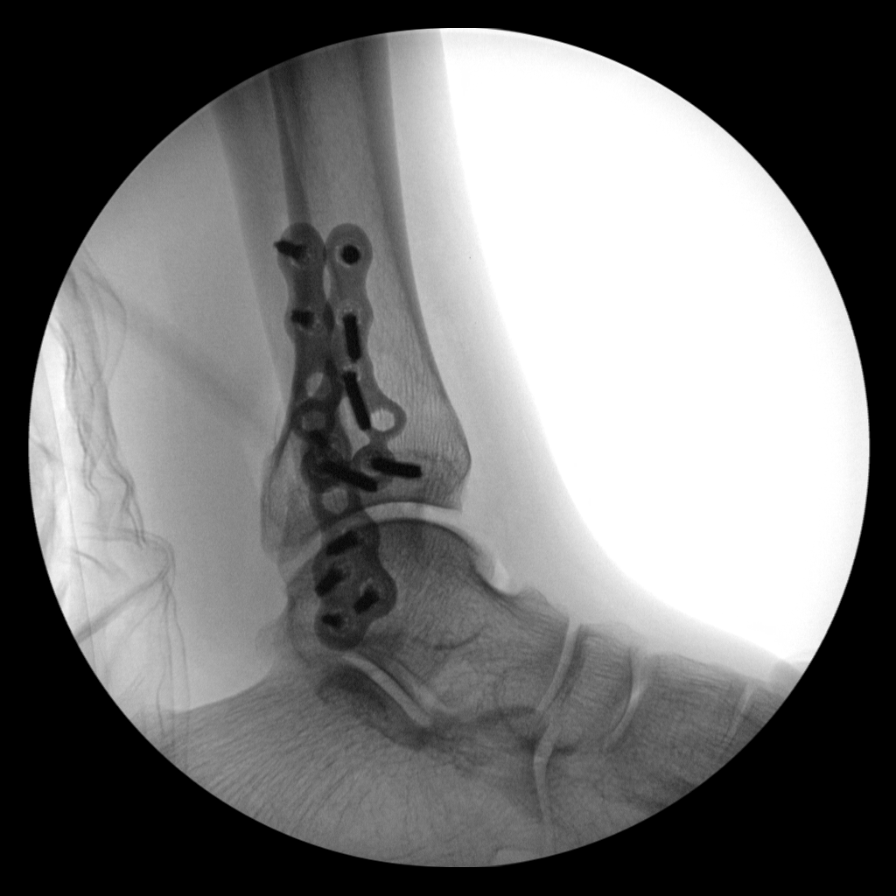
[im 7/7]
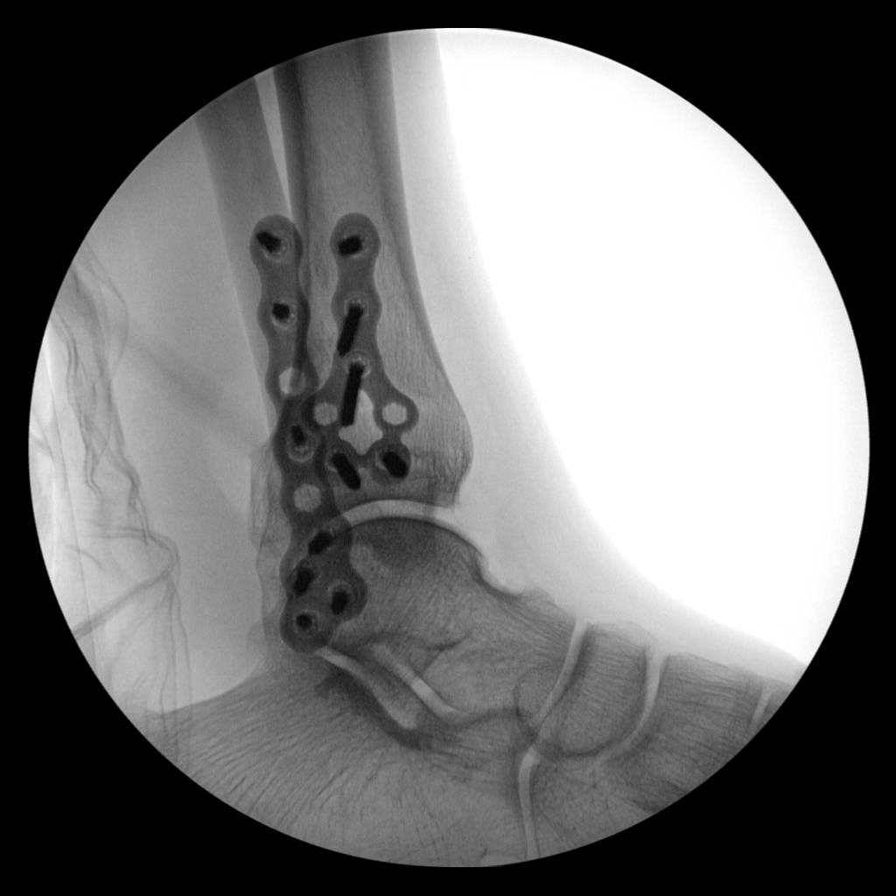

[7 of 7 positions shown; findings below may reference images not displayed]

FINDINGS: The patient has undergone plate screw fixation of the distal tibia.
Again noted is a transverse fracture through the distal fibula.
There is surrounding soft tissue swelling and expected postsurgical
changes. The hardware is intact. Final images demonstrate plate and
screw fixation of the distal fibula with improved osseous alignment.
IMPRESSION: Status post ORIF of the distal tibia and fibula with improved
osseous alignment.

## 2021-07-18 IMAGING — CT CT SHOULDER*R* W/O CM
2 series · 12 of 29 positions shown, 15 images · non-contrast
Comparison: Radiograph 03/11/2020

CLINICAL DATA: Evaluate right shoulder fracture. History of
dislocation.

EXAM:
CT OF THE UPPER RIGHT EXTREMITY WITHOUT CONTRAST
TECHNIQUE: Multidetector CT imaging of the upper right extremity was performed
according to the standard protocol.

[Series 3: shoulder 3.0 b31s st · axial · 0.49mm/px · z∈[-364,-160]mm · 7 of 82 slices shown, 9 images]
[im 7/82  soft-tissue]
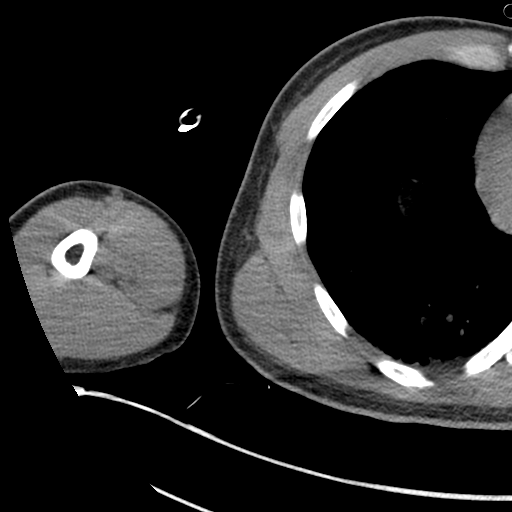
[im 7/82  bone]
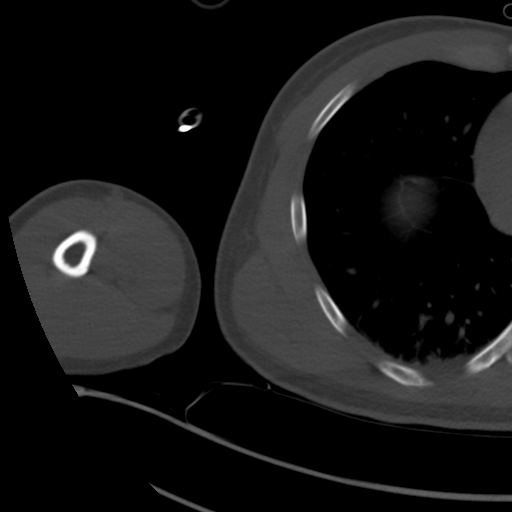
[im 19/82  bone]
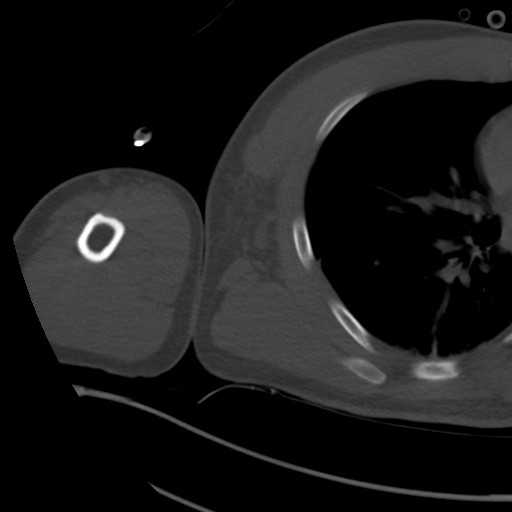
[im 32/82  bone]
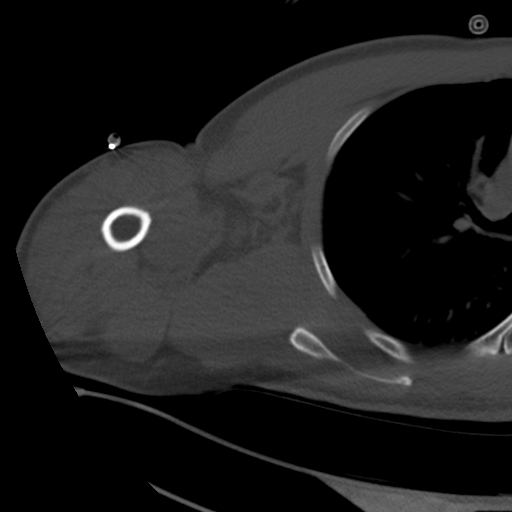
[im 44/82  bone]
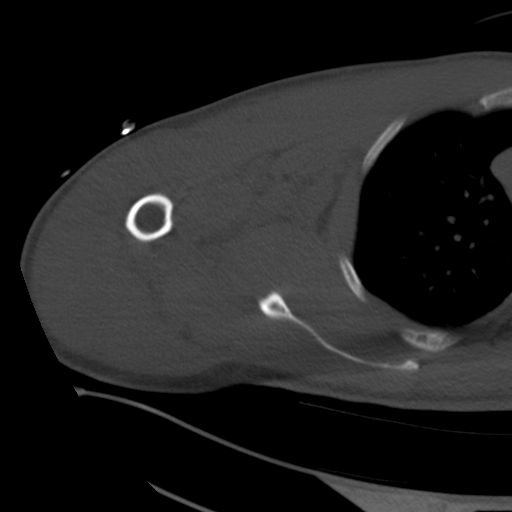
[im 50/82  soft-tissue]
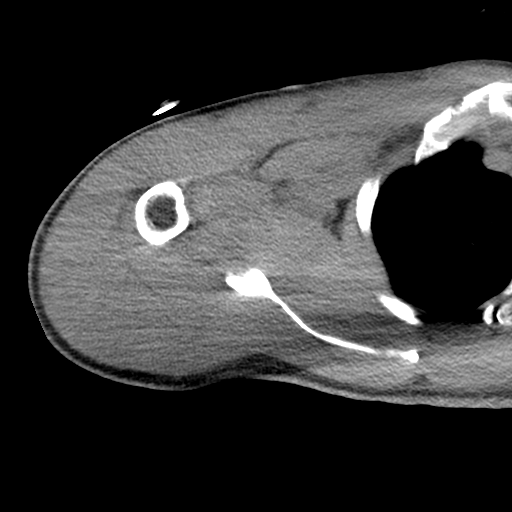
[im 50/82  bone]
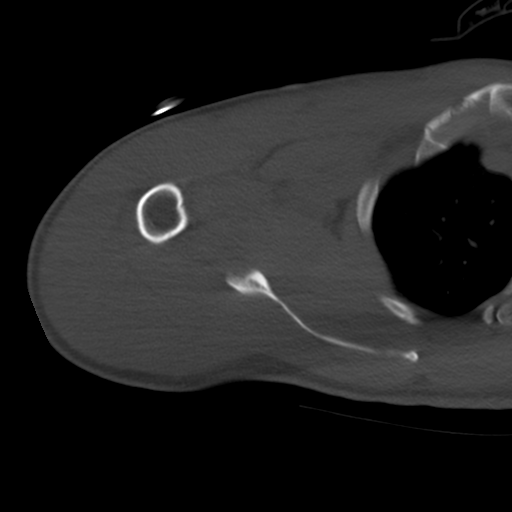
[im 63/82  bone]
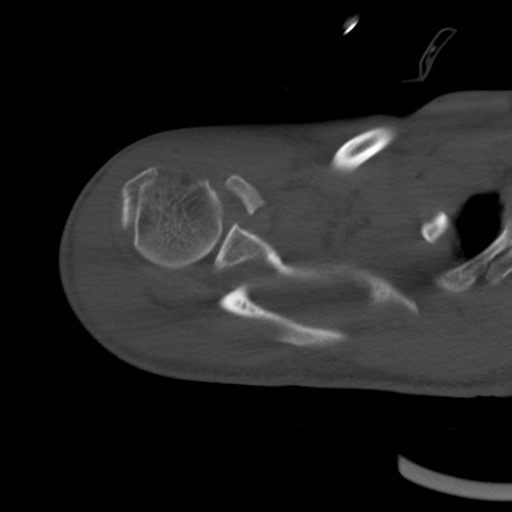
[im 75/82  bone]
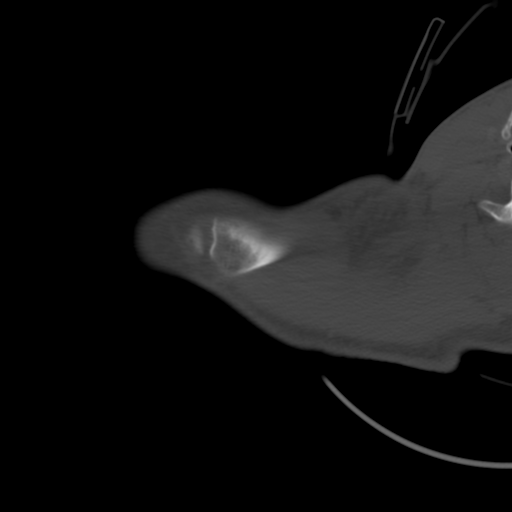

[Series 10: sag st · sagittal · 0.28mm/px · 5 of 105 slices shown, 6 images]
[im 35/105  bone]
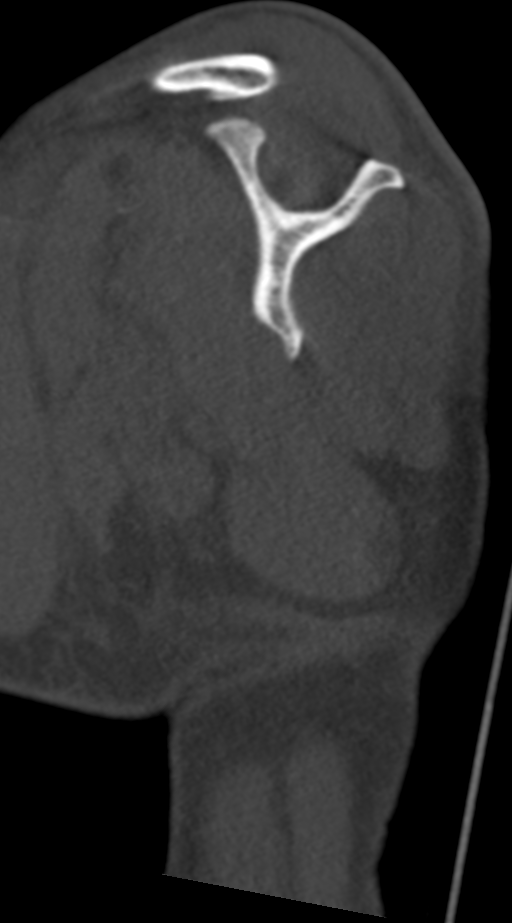
[im 44/105  bone]
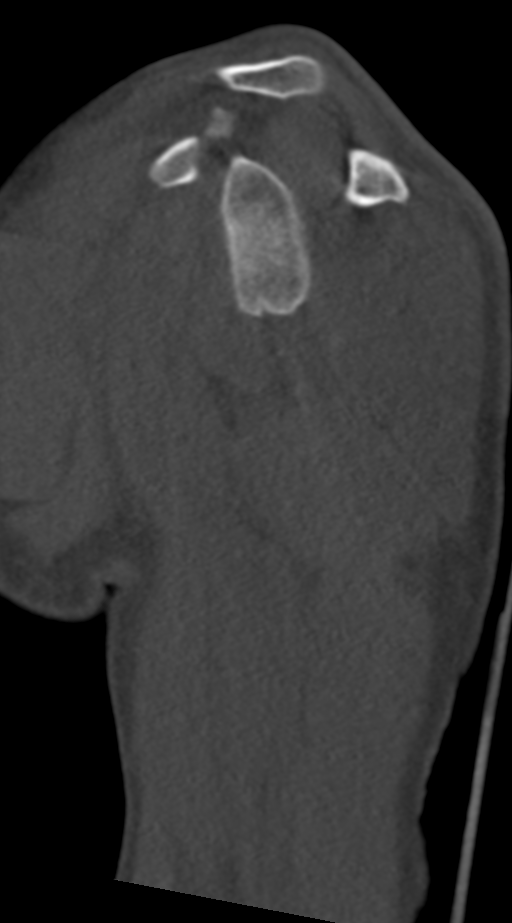
[im 53/105  soft-tissue]
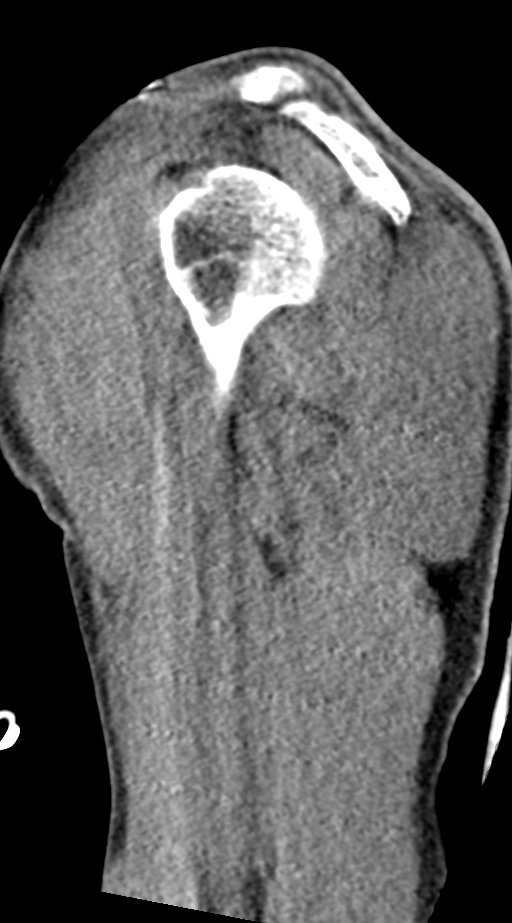
[im 53/105  bone]
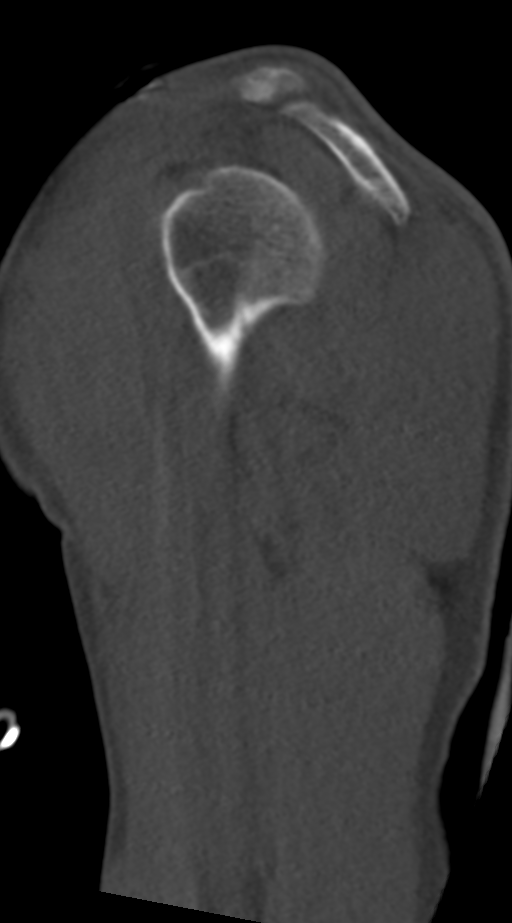
[im 61/105  bone]
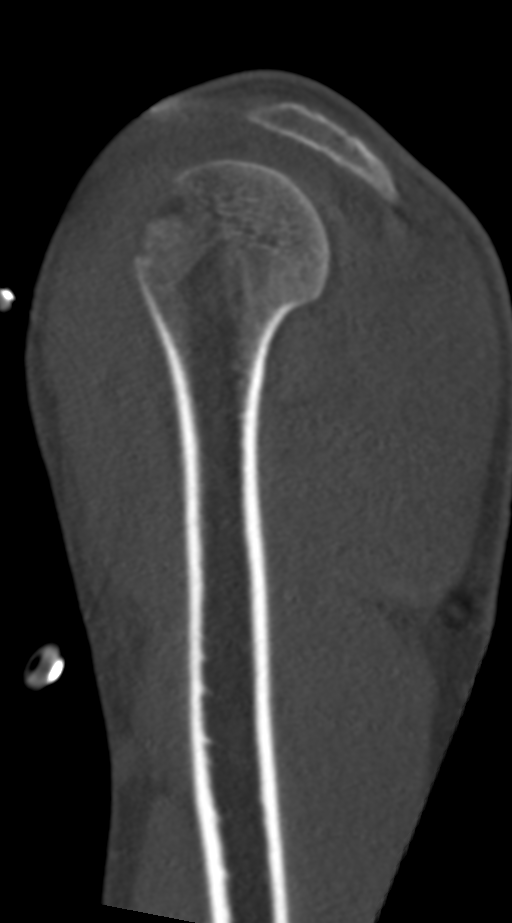
[im 70/105  bone]
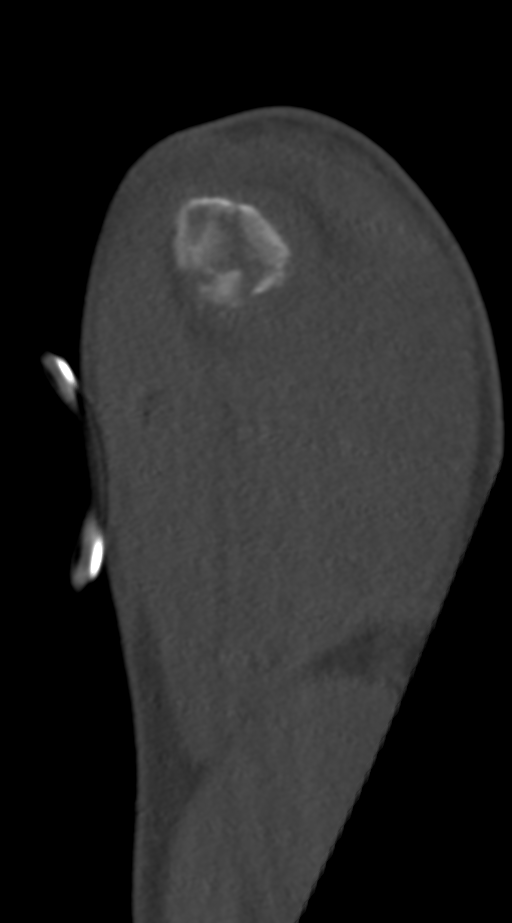

[12 of 29 positions shown; findings below may reference images not displayed]

FINDINGS: The humeral head is located in the glenoid fossa. Mild internal
rotation is noted.

There is a displaced fracture of the coracoid process through it
base.

Displaced and comminuted fracture of the greater tuberosity. The
fractured fragment is located along the lateral aspect of the
humeral head.

No humeral neck fracture.

The AC joint is intact. There is a comminuted fracture involving the
proximal clavicle near the SC joint.

Surrounding soft tissue swelling/edema/hemorrhage is noted. The
visualized right ribs are intact. No right-sided pneumothorax is
identified. No pulmonary contusion.
IMPRESSION: 1. Displaced and comminuted fracture of the greater tuberosity. No
fracture of the humeral neck.
2. Displaced fracture of the coracoid process.
3. Comminuted fracture involving the proximal clavicle near the SC
joint.

## 2022-10-27 NOTE — Telephone Encounter (Signed)
Error

## 2023-04-12 ENCOUNTER — Ambulatory Visit: Payer: Self-pay | Admitting: Physician Assistant

## 2024-04-07 ENCOUNTER — Encounter (HOSPITAL_COMMUNITY): Payer: Self-pay

## 2024-04-07 ENCOUNTER — Observation Stay (HOSPITAL_COMMUNITY)
Admission: EM | Admit: 2024-04-07 | Discharge: 2024-04-08 | Disposition: A | Discharge status: Home / Self Care | Attending: Internal Medicine | Admitting: Internal Medicine

## 2024-04-07 ENCOUNTER — Emergency Department (HOSPITAL_COMMUNITY)

## 2024-04-07 ENCOUNTER — Other Ambulatory Visit: Payer: Self-pay

## 2024-04-07 DIAGNOSIS — M25511 Pain in right shoulder: Secondary | ICD-10-CM | POA: Diagnosis present

## 2024-04-07 DIAGNOSIS — Z1152 Encounter for screening for COVID-19: Secondary | ICD-10-CM | POA: Diagnosis not present

## 2024-04-07 DIAGNOSIS — W19XXXA Unspecified fall, initial encounter: Secondary | ICD-10-CM | POA: Diagnosis not present

## 2024-04-07 DIAGNOSIS — F10920 Alcohol use, unspecified with intoxication, uncomplicated: Principal | ICD-10-CM

## 2024-04-07 DIAGNOSIS — F1010 Alcohol abuse, uncomplicated: Secondary | ICD-10-CM | POA: Diagnosis present

## 2024-04-07 DIAGNOSIS — S42201A Unspecified fracture of upper end of right humerus, initial encounter for closed fracture: Secondary | ICD-10-CM | POA: Diagnosis not present

## 2024-04-07 DIAGNOSIS — S42401A Unspecified fracture of lower end of right humerus, initial encounter for closed fracture: Principal | ICD-10-CM | POA: Insufficient documentation

## 2024-04-07 DIAGNOSIS — S42291A Other displaced fracture of upper end of right humerus, initial encounter for closed fracture: Secondary | ICD-10-CM

## 2024-04-07 LAB — CBC WITH DIFFERENTIAL/PLATELET
Abs Immature Granulocytes: 0.04 K/uL (ref 0.00–0.07)
Basophils Absolute: 0 K/uL (ref 0.0–0.1)
Basophils Relative: 0 %
Eosinophils Absolute: 0 K/uL (ref 0.0–0.5)
Eosinophils Relative: 0 %
HCT: 42.5 % (ref 39.0–52.0)
Hemoglobin: 15.3 g/dL (ref 13.0–17.0)
Immature Granulocytes: 0 %
Lymphocytes Relative: 15 %
Lymphs Abs: 1.5 K/uL (ref 0.7–4.0)
MCH: 31.5 pg (ref 26.0–34.0)
MCHC: 36 g/dL (ref 30.0–36.0)
MCV: 87.6 fL (ref 80.0–100.0)
Monocytes Absolute: 0.6 K/uL (ref 0.1–1.0)
Monocytes Relative: 5 %
Neutro Abs: 8.2 K/uL — ABNORMAL HIGH (ref 1.7–7.7)
Neutrophils Relative %: 80 %
Platelets: 314 K/uL (ref 150–400)
RBC: 4.85 MIL/uL (ref 4.22–5.81)
RDW: 13.3 % (ref 11.5–15.5)
WBC: 10.3 K/uL (ref 4.0–10.5)
nRBC: 0 % (ref 0.0–0.2)

## 2024-04-07 LAB — COMPREHENSIVE METABOLIC PANEL WITH GFR
ALT: 20 U/L (ref 0–44)
AST: 19 U/L (ref 15–41)
Albumin: 3.9 g/dL (ref 3.5–5.0)
Alkaline Phosphatase: 68 U/L (ref 38–126)
Anion gap: 14 (ref 5–15)
BUN: <5 mg/dL — ABNORMAL LOW (ref 6–20)
CO2: 24 mmol/L (ref 22–32)
Calcium: 8.9 mg/dL (ref 8.9–10.3)
Chloride: 107 mmol/L (ref 98–111)
Creatinine, Ser: 0.88 mg/dL (ref 0.61–1.24)
GFR, Estimated: >60 mL/min (ref >60)
Glucose, Bld: 128 mg/dL — ABNORMAL HIGH (ref 70–99)
Potassium: 3.7 mmol/L (ref 3.5–5.1)
Sodium: 145 mmol/L (ref 135–145)
Total Bilirubin: 0.4 mg/dL (ref 0.0–1.2)
Total Protein: 7.7 g/dL (ref 6.5–8.1)

## 2024-04-07 LAB — ETHANOL: Alcohol, Ethyl (B): 201 mg/dL — ABNORMAL HIGH (ref <15)

## 2024-04-07 MED ORDER — THIAMINE MONONITRATE 100 MG PO TABS
100.0000 mg | ORAL_TABLET | Freq: Every day | ORAL | Status: DC
  Administered 2024-04-08 (×2): 100 mg via ORAL
  Filled 2024-04-07: qty 1

## 2024-04-07 MED ORDER — THIAMINE HCL 100 MG/ML IJ SOLN: 100.0000 mg | Freq: Every day | INTRAMUSCULAR | Status: DC

## 2024-04-07 MED ORDER — LORAZEPAM 1 MG PO TABS: 1.0000 mg | ORAL_TABLET | ORAL | Status: DC | PRN

## 2024-04-07 MED ORDER — ADULT MULTIVITAMIN W/MINERALS CH
1.0000 | ORAL_TABLET | Freq: Every day | ORAL | Status: DC
  Administered 2024-04-08 (×2): 1 via ORAL
  Filled 2024-04-07: qty 1

## 2024-04-07 MED ORDER — FOLIC ACID 1 MG PO TABS
1.0000 mg | ORAL_TABLET | Freq: Every day | ORAL | Status: DC
  Administered 2024-04-08 (×2): 1 mg via ORAL
  Filled 2024-04-07: qty 1

## 2024-04-07 NOTE — ED Notes (Signed)
 Patient transported to CT
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
## 2024-04-07 NOTE — ED Notes (Signed)
 2 unsuccessful IV start & blood draw attempts. Consult for IV

## 2024-04-07 NOTE — ED Notes (Signed)
Pt refused to get up and walk

## 2024-04-07 NOTE — ED Provider Notes (Signed)
 [facility] AT [facility] Provider Note   CSN: [id number] Arrival date & time: 04/07/24  1648     Patient presents with: Fall and Alcohol Intoxication   Anthony Macias is a 43 y.o. male.   43 year old male with a history of recurrent right shoulder dislocations who presents emergency department right shoulder pain.  Patient tells me that he woke up from sleep last night and has been having some right shoulder pain.  Says he has a history of dislocations and feels like it is dislocated.  Says he was drinking last night but does not recall any injuries.  Per EMS, was drinking all day yesterday and fell and injured his right shoulder.  Denies any numbness or tingling in his arm.  Not having any significant headache.       Prior to Admission medications   Medication Sig Start Date End Date Taking? Authorizing Provider  acetaminophen  (TYLENOL ) 325 MG tablet Take 2 tablets (650 mg total) by mouth every 6 (six) hours as needed for mild pain or fever. 05/12/20   Meuth, Brooke A, PA-C  docusate sodium  (COLACE) 100 MG capsule Take 1 capsule (100 mg total) by mouth 2 (two) times daily. 05/12/20   Meuth, Brooke A, PA-C  feeding supplement, ENSURE ENLIVE, (ENSURE ENLIVE) LIQD Take 237 mLs by mouth 3 (three) times daily between meals. 05/12/20   Meuth, Brooke A, PA-C  Nutritional Supplements (,FEEDING SUPPLEMENT, PROSOURCE PLUS) liquid Take 30 mLs by mouth 3 (three) times daily between meals. 05/12/20   Meuth, Brooke A, PA-C  pantoprazole  (PROTONIX ) 40 MG tablet Take 1 tablet (40 mg total) by mouth daily. 05/13/20   Meuth, Lyle LABOR, PA-C    Allergies: Patient has no known allergies.    Review of Systems  Updated Vital Signs BP (!) 135/97 (BP Location: Right Arm)   Pulse (!) 101   Temp 99.6 F (37.6 C) (Oral)   Resp 13   SpO2 96%   Physical Exam Constitutional:      Appearance: Normal appearance.  HENT:     Head: Normocephalic and atraumatic.  Eyes:      Extraocular Movements: Extraocular movements intact.     Pupils: Pupils are equal, round, and reactive to light.     Comments: Pupils 3 mm bilaterally  Neck:     Comments: No C-spine midline tenderness palpation Cardiovascular:     Rate and Rhythm: Normal rate and regular rhythm.     Pulses: Normal pulses.     Heart sounds: Normal heart sounds.  Pulmonary:     Effort: Pulmonary effort is normal.     Breath sounds: Normal breath sounds.  Musculoskeletal:     Comments: Tenderness palpation of right proximal humerus.  Impaired range of motion due to pain.  Intact sensation to light touch of all dermatomes of the right upper extremity.  No tenderness palpation of the right clavicle.  No tenderness palpation of right elbow or wrist.  No tenderness palpation of left shoulder, elbow, or wrist.  No tenderness palpation of bilateral hips, knees, or ankles  Neurological:     Mental Status: He is alert.     (all labs ordered are listed, but only abnormal results are displayed) Labs Reviewed  CBC WITH DIFFERENTIAL/PLATELET - Abnormal; Notable for the following components:      Result Value   Neutro Abs 8.2 (*)    All other components within normal limits  COMPREHENSIVE METABOLIC PANEL WITH GFR - Abnormal; Notable for the  following components:   Glucose, Bld 128 (*)    BUN <5 (*)    All other components within normal limits  ETHANOL - Abnormal; Notable for the following components:   Alcohol, Ethyl (B) 201 (*)    All other components within normal limits    EKG: None  Radiology: CT Head Wo Contrast Result Date: 04/07/2024 CLINICAL DATA:  Head trauma, intracranial arterial injury suspected; fall. Intoxication. Shoulder pain EXAM: CT HEAD WITHOUT CONTRAST CT CERVICAL SPINE WITHOUT CONTRAST TECHNIQUE: Multidetector CT imaging of the head and cervical spine was performed following the standard protocol without intravenous contrast. Multiplanar CT image reconstructions of the cervical spine were  also generated. RADIATION DOSE REDUCTION: This exam was performed according to the departmental dose-optimization program which includes automated exposure control, adjustment of the mA and/or kV according to patient size and/or use of iterative reconstruction technique. 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 Mild periventricular white matter changes are present, likely reflecting the sequela of small vessel ischemia relatively advanced given the patient's age. No acute intracranial hemorrhage or infarct. No mass effect or midline shift. No abnormal intra or extra-axial mass lesion or fluid collection. Ventricular size is normal. Cerebellum is unremarkable. Vascular: Unremarkable Skull: Normal. Negative for fracture or focal lesion. Sinuses/Orbits: No acute finding. Other: Mastoid air cells and middle ear cavities are clear. CT CERVICAL SPINE FINDINGS Alignment: Normal. Skull base and vertebrae: No acute fracture. No primary bone lesion or focal pathologic process. Soft tissues and spinal canal: No prevertebral fluid or swelling. No visible canal hematoma. Disc levels: There is endplate remodeling and disc annular calcification throughout the cervical spine in keeping with changes of mild degenerative disc disease. Prevertebral soft tissues are not thickened on sagittal reformats. Spinal is widely patent. No significant neuroforaminal narrowing. Upper chest: No acute abnormality. Healed medial right clavicle fracture is partially visualized. Other: None IMPRESSION: 1. No acute intracranial abnormality. No calvarial fracture. 2. 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 3. Mild  periventricular white matter changes, likely reflecting the sequela of small vessel ischemia relatively advanced given the patient's age. 4. No acute fracture or listhesis of the cervical spine. Electronically Signed   By: Dorethia Molt M.D.   On: 04/07/2024 21:03   CT Cervical Spine Wo Contrast Result Date: 04/07/2024 CLINICAL DATA:  Head trauma, intracranial arterial injury suspected; fall. Intoxication. Shoulder pain EXAM: CT HEAD WITHOUT CONTRAST CT CERVICAL SPINE WITHOUT CONTRAST TECHNIQUE: Multidetector CT imaging of the head and cervical spine was performed following the standard protocol without intravenous contrast. Multiplanar CT image reconstructions of the cervical spine were also generated. RADIATION DOSE REDUCTION: This exam was performed according to the departmental dose-optimization program which includes automated exposure control, adjustment of the mA and/or kV according to patient size and/or use of iterative reconstruction technique. 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 Mild periventricular white matter changes are present, likely reflecting the sequela of small vessel ischemia relatively advanced given the patient's age. No acute intracranial hemorrhage or infarct. No mass effect or midline shift. No abnormal intra or extra-axial mass lesion or fluid  collection. Ventricular size is normal. Cerebellum is unremarkable. Vascular: Unremarkable Skull: Normal. Negative for fracture or focal lesion. Sinuses/Orbits: No acute finding. Other: Mastoid air cells and middle ear cavities are clear. CT CERVICAL SPINE FINDINGS Alignment: Normal. Skull base and vertebrae: No acute fracture. No primary bone lesion or focal pathologic process. Soft tissues and spinal canal: No prevertebral fluid or  swelling. No visible canal hematoma. Disc levels: There is endplate remodeling and disc annular calcification throughout the cervical spine in keeping with changes of mild degenerative disc disease. Prevertebral soft tissues are not thickened on sagittal reformats. Spinal is widely patent. No significant neuroforaminal narrowing. Upper chest: No acute abnormality. Healed medial right clavicle fracture is partially visualized. Other: None IMPRESSION: 1. No acute intracranial abnormality. No calvarial fracture. 2. 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 3. Mild periventricular white matter changes, likely reflecting the sequela of small vessel ischemia relatively advanced given the patient's age. 4. No acute fracture or listhesis of the cervical spine. Electronically Signed   By: Dorethia Molt M.D.   On: 04/07/2024 21:03   DG Shoulder Right Result Date: 04/07/2024 CLINICAL DATA:  Fall EXAM: RIGHT SHOULDER - 2+ VIEW COMPARISON:  Right shoulder x-ray 05/27/2020 FINDINGS: There is an acute oblique fracture through the humeral neck. Fracture fragments are distracted 16 mm. Orthopedic screws seen in the region of the coracoid, unchanged. There are no humeral joint space narrowing and osteophyte formation as well as surrounding soft tissue calcifications appears stable from prior. IMPRESSION: Acute oblique fracture through the humeral neck. Electronically Signed   By: Greig Pique M.D.   On: 04/07/2024 18:49     Procedures   Medications Ordered in the ED  LORazepam  (ATIVAN ) tablet 1-4 mg (has no administration in time range)  thiamine  (VITAMIN B1) tablet 100 mg (has no administration in time range)    Or  thiamine  (VITAMIN B1) injection 100 mg (has no administration in time range)  folic acid  (FOLVITE ) tablet 1 mg (has no administration in time range)  multivitamin with  minerals tablet 1 tablet (has no administration in time range)    Clinical Course as of 04/07/24 2343  Sun Apr 07, 2024  2231 Patient states that he walks with a crutch and is right-handed.  Lives at a friend's house with several steps.  Is concerned about going home with his broken arm and inability to use his assistive devices. [RP]  2248 Discussed with Dr. Gonzella from orthopedics.  Will discuss with Dr. Addie but thinks that the patient could potentially be an operative candidate [RP]    Clinical Course User Index [RP] Yolande Lamar BROCKS, MD                                 Medical Decision Making Amount and/or Complexity of Data Reviewed Labs: ordered. Radiology: ordered.  Risk OTC drugs. Prescription drug management. Decision regarding hospitalization.   42 year old male with a history of right shoulder dislocations status post proximal humerus and coracoid fracture fixation who presents emergency department with right shoulder pain after a fall  Initial Ddx:  Shoulder fracture, dislocation, TBI, C-spine injury, intoxication  MDM/Course:  Patient presents emergency department right shoulder pain after a fall.  He reports to me that he just woke up out of sleep like this.  Does not recall any injuries.  Per  EMS he got drunk and fell.  No signs of head trauma.  Does have significant tenderness palpation of the proximal humerus.  Unable to range arm but is neurovascular intact distally.  No signs of an open fracture.  He underwent an x-ray that shows that he has a spiral fracture of his proximal humerus.  Because of his unreliable history did obtain a CT of the head and C-spine did not show acute findings.  He has a blood alcohol level of 200.  Does not appear to be in alcohol withdrawals at this point in time.  Does walk around with a crutch and is having difficulty doing so since he is right-handed and has a broken humerus on the right side.  Discussed with orthopedics who is going  to evaluate him for operative repair.  Will admit him to the hospital for further management in the meantime.  Placed on CIWA protocol.   This patient presents to the ED for concern of complaints listed in HPI, this involves an extensive number of treatment options, and is a complaint that carries with it a high risk of complications and morbidity. Disposition including potential need for admission considered.   Dispo: Admit to Floor  Records reviewed Outpatient Clinic Notes The following labs were independently interpreted: Chemistry and show no acute abnormality I independently reviewed the following imaging with scope of interpretation limited to determining acute life threatening conditions related to emergency care: Extremity x-ray(s) and agree with the radiologist interpretation with the following exceptions: none I personally reviewed and interpreted cardiac monitoring: normal sinus rhythm  I have reviewed the patients home medications and made adjustments as needed Consults: Hospitalist and Orthopedics  Portions of this note were generated with Scientist, clinical (histocompatibility and immunogenetics). Dictation errors may occur despite best attempts at proofreading.     Final diagnoses:  Alcoholic intoxication without complication (HCC)  Fall, initial encounter  Other closed displaced fracture of proximal end of right humerus, initial encounter    ED Discharge Orders     None          Yolande Lamar BROCKS, MD 04/07/24 2343

## 2024-04-07 NOTE — ED Notes (Signed)
 Pt states he is unable to ambulate d/t his shoulder hurting. States with movement the should pain causes his other pains to activate. EDP aware.

## 2024-04-07 NOTE — Progress Notes (Signed)
 Orthopedic Tech Progress Note Patient Details:  Anthony Macias 19-May-1981 [id number]  Ortho Devices Type of Ortho Device: Sling immobilizer Ortho Device/Splint Location: rue Ortho Device/Splint Interventions: Ordered, Application, Adjustment   Post Interventions Patient Tolerated: Well Instructions Provided: Care of device, Adjustment of device  Chandra Dorn PARAS 04/07/2024, 8:21 PM

## 2024-04-08 ENCOUNTER — Observation Stay (HOSPITAL_COMMUNITY)

## 2024-04-08 DIAGNOSIS — S42201A Unspecified fracture of upper end of right humerus, initial encounter for closed fracture: Secondary | ICD-10-CM | POA: Diagnosis not present

## 2024-04-08 DIAGNOSIS — F1010 Alcohol abuse, uncomplicated: Secondary | ICD-10-CM | POA: Diagnosis present

## 2024-04-08 LAB — BASIC METABOLIC PANEL WITH GFR
Anion gap: 11 (ref 5–15)
BUN: 6 mg/dL (ref 6–20)
CO2: 22 mmol/L (ref 22–32)
Calcium: 8.7 mg/dL — ABNORMAL LOW (ref 8.9–10.3)
Chloride: 109 mmol/L (ref 98–111)
Creatinine, Ser: 0.87 mg/dL (ref 0.61–1.24)
GFR, Estimated: >60 mL/min (ref >60)
Glucose, Bld: 101 mg/dL — ABNORMAL HIGH (ref 70–99)
Potassium: 3.7 mmol/L (ref 3.5–5.1)
Sodium: 142 mmol/L (ref 135–145)

## 2024-04-08 LAB — CBC
HCT: 38.2 % — ABNORMAL LOW (ref 39.0–52.0)
Hemoglobin: 13.8 g/dL (ref 13.0–17.0)
MCH: 31.7 pg (ref 26.0–34.0)
MCHC: 36.1 g/dL — ABNORMAL HIGH (ref 30.0–36.0)
MCV: 87.6 fL (ref 80.0–100.0)
Platelets: 306 K/uL (ref 150–400)
RBC: 4.36 MIL/uL (ref 4.22–5.81)
RDW: 13.4 % (ref 11.5–15.5)
WBC: 9.9 K/uL (ref 4.0–10.5)
nRBC: 0 % (ref 0.0–0.2)

## 2024-04-08 LAB — VITAMIN D 25 HYDROXY (VIT D DEFICIENCY, FRACTURES): Vit D, 25-Hydroxy: 18.65 ng/mL — ABNORMAL LOW (ref 30–100)

## 2024-04-08 LAB — HIV ANTIBODY (ROUTINE TESTING W REFLEX): HIV Screen 4th Generation wRfx: NONREACTIVE

## 2024-04-08 MED ORDER — ACETAMINOPHEN 325 MG PO TABS
650.0000 mg | ORAL_TABLET | Freq: Four times a day (QID) | ORAL | Status: AC | PRN
Start: 2024-04-08 — End: ?

## 2024-04-08 MED ORDER — ONDANSETRON HCL 4 MG PO TABS
4.0000 mg | ORAL_TABLET | Freq: Four times a day (QID) | ORAL | Status: DC | PRN
Start: 2024-04-08 — End: 2024-04-08

## 2024-04-08 MED ORDER — ONDANSETRON HCL 4 MG/2ML IJ SOLN: 4.0000 mg | Freq: Four times a day (QID) | INTRAMUSCULAR | Status: DC | PRN

## 2024-04-08 MED ORDER — HYDROMORPHONE HCL 1 MG/ML IJ SOLN
0.5000 mg | INTRAMUSCULAR | Status: DC | PRN
  Administered 2024-04-08 (×2): 0.5 mg via INTRAVENOUS
  Filled 2024-04-08: qty 1

## 2024-04-08 MED ORDER — CHOLECALCIFEROL 1.25 MG (50000 UT) PO TABS: 50000.0000 [IU] | ORAL_TABLET | ORAL | 0 refills | Status: AC

## 2024-04-08 MED ORDER — OXYCODONE HCL 5 MG PO TABS: 5.0000 mg | ORAL_TABLET | ORAL | 0 refills | Status: DC | PRN

## 2024-04-08 MED ORDER — NAPROXEN 500 MG PO TABS: 500.0000 mg | ORAL_TABLET | Freq: Two times a day (BID) | ORAL | Status: AC

## 2024-04-08 MED ORDER — LORAZEPAM 1 MG PO TABS: 0.0000 mg | ORAL_TABLET | Freq: Two times a day (BID) | ORAL | Status: DC

## 2024-04-08 MED ORDER — LORAZEPAM 1 MG PO TABS
0.0000 mg | ORAL_TABLET | Freq: Four times a day (QID) | ORAL | Status: DC
  Administered 2024-04-08 (×2): 1 mg via ORAL
  Filled 2024-04-08: qty 1

## 2024-04-08 MED ORDER — OXYCODONE HCL 5 MG PO TABS
5.0000 mg | ORAL_TABLET | ORAL | Status: DC | PRN
  Administered 2024-04-08 (×4): 5 mg via ORAL
  Filled 2024-04-08 (×2): qty 1

## 2024-04-08 MED ORDER — POLYETHYLENE GLYCOL 3350 17 G PO PACK: 17.0000 g | PACK | Freq: Every day | ORAL | Status: DC | PRN

## 2024-04-08 MED ORDER — LORAZEPAM 1 MG PO TABS: 1.0000 mg | ORAL_TABLET | ORAL | Status: DC | PRN

## 2024-04-08 MED ORDER — DIAZEPAM 5 MG/ML IJ SOLN: 2.5000 mg | INTRAMUSCULAR | Status: DC | PRN

## 2024-04-08 MED ORDER — ACETAMINOPHEN 650 MG RE SUPP: 650.0000 mg | Freq: Four times a day (QID) | RECTAL | Status: DC | PRN

## 2024-04-08 MED ORDER — ACETAMINOPHEN 325 MG PO TABS
650.0000 mg | ORAL_TABLET | Freq: Four times a day (QID) | ORAL | Status: DC | PRN
  Administered 2024-04-08 (×2): 650 mg via ORAL
  Filled 2024-04-08: qty 2

## 2024-04-08 NOTE — Evaluation (Signed)
 Physical Therapy Brief Evaluation and Discharge Note Patient Details Name: Anthony Macias MRN: [id number] DOB: [birth year] Today's Date: 04/08/2024   History of Present Illness  Pt is a 43 y.o. male presenting with R shoulder pain. Found to have oblique mildly displaced proximal humerus fx. PMH significant for right shoulder greater tuberosity ORIF with bicep tenodesis and coracoid fracture fixation in 2021 with Dr Addie, TBI, ORIF L femur, ORIF L ankle, ORIF R distal radius, ORIF R shoulder, bil sacroiliac pinning.  Clinical Impression  Pt able to mobilize modified independent using single crutch in LUE. Ready for dc home from PT standpoint and no follow up.       PT Assessment Patient does not need any further PT services  Assistance Needed at Discharge  PRN    Equipment Recommendations Crutches  Recommendations for Other Services       Precautions/Restrictions Precautions Precautions: Shoulder Type of Shoulder Precautions: per ortho PA in room, immobilize R shoulder in sling at all times. Shoulder Interventions: Shoulder sling/immobilizer;At all times;Off for dressing/bathing/exercises Precaution/Restrictions Comments: pt aware of sling orders Required Braces or Orthoses: Sling Restrictions Weight Bearing Restrictions Per Provider Order: Yes RUE Weight Bearing Per Provider Order: Non weight bearing        Mobility  Bed Mobility   Supine/Sidelying to sit: Modified independent (Device/Increased time) Sit to supine/sidelying: Modified independent (Device/Increased time)    Transfers Overall transfer level: Modified independent Equipment used: None, Crutches Transfers: Sit to/from Stand Sit to Stand: Modified independent (Device/Increase time)                Ambulation/Gait Ambulation/Gait assistance: Modified independent (Device/Increase time) Gait Distance (Feet): 60 Feet Assistive device: Crutches (1 crutch) Gait Pattern/deviations: Step-through  pattern, Decreased stride length Gait Speed: Below normal General Gait Details: Steady gait with crutch in lt  Home Activity Instructions    Stairs Stairs: Yes Stairs assistance: Supervision Stair Management: One rail Left Number of Stairs: 1 General stair comments: Portable step  Modified Rankin (Stroke Patients Only)        Balance Overall balance assessment: Mild deficits observed, not formally tested                        Pertinent Vitals/Pain PT - Brief Vital Signs All Vital Signs Stable: Yes Pain Assessment Pain Assessment: Faces Faces Pain Scale: Hurts even more Pain Location: rt shoulder Pain Descriptors / Indicators: Aching, Sore, Guarding Pain Intervention(s): Limited activity within patient's tolerance     Home Living Family/patient expects to be discharged to:: Private residence Living Arrangements: Parent Available Help at Discharge: Family;Available PRN/intermittently;Friend(s) Home Environment: Stairs to enter  [employer] of Steps: 3 flights Home Equipment: Cane - quad;Adaptive equipment Adaptive Equipment: Long-handled shoe horn Additional Comments: Pt has lost his crutch    Prior Function Level of Independence: Independent with assistive device(s) Comments: used crutch in community    UE/LE Assessment   UE ROM/Strength/Tone/Coordination:  (defer to OT)    LE ROM/Strength/Tone/Coordination: [facility]      Communication   Communication Communication: Other (comment) Factors Affecting Communication:  (intermittent increased time for processing and verbal expression)     Cognition Overall Cognitive Status: No family/caregiver present to determine baseline cognitive functioning       General Comments      Exercises     Assessment/Plan    PT Problem List         PT Visit Diagnosis      No Skilled PT Patient is  modified independent with all activity/mobility   Co-evaluation                AMPAC 6 Clicks Help  needed turning from your back to your side while in a flat bed without using bedrails?: None Help needed moving from lying on your back to sitting on the side of a flat bed without using bedrails?: None Help needed moving to and from a bed to a chair (including a wheelchair)?: None Help needed standing up from a chair using your arms (e.g., wheelchair or bedside chair)?: None Help needed to walk in hospital room?: None Help needed climbing 3-5 steps with a railing? : A Little 6 Click Score: 23      End of Session   Activity Tolerance: Patient tolerated treatment well Patient left: Other (comment) (standing in room) Nurse Communication: Mobility status       Time: 8550-8493 PT Time Calculation (min) (ACUTE ONLY): 17 min  Charges:   PT Evaluation $PT Eval Low Complexity: 1 Low      [facility] PT Acute Rehabilitation Services Office [phone]   [facility] ORN [facility]  04/08/2024, 3:20 PM

## 2024-04-08 NOTE — Progress Notes (Signed)
 CSW added substance abuse resources to patient's AVS.  Niels Portugal, MSW, LCSW Transitions of Care  Clinical Social Worker II [phone]

## 2024-04-08 NOTE — Evaluation (Signed)
 Occupational Therapy Evaluation Patient Details Name: Anthony Macias MRN: [id number] DOB: 25-Aug-1981 Today's Date: 04/08/2024   History of Present Illness   Pt is a 43 y.o. male presenting with R shoulder pain. Found to have oblique mildly displaced proximal humerus fx. PMH significant for right shoulder greater tuberosity ORIF with bicep tenodesis and coracoid fracture fixation in 2021 with Dr Addie, TBI, ORIF L femur, ORIF L ankle, ORIF R distal radius, ORIF R shoulder, bil sacroiliac pinning.     Clinical Impressions PTA, pt lived with mother and reports being mod I for BADL; mother works 9-5 daily. Upon eval, pt needing up to min A for BADL and with decreased recall of precautions but is aware he is supposed to wear sling at all times to allow shoulder to heal. Recommend follow physician orders for discharge. HHOT may be helpful to transition back to ADL in natural environment.      If plan is discharge home, recommend the following:   A little help with walking and/or transfers;A little help with bathing/dressing/bathroom;Assistance with cooking/housework;Assist for transportation;Help with stairs or ramp for entrance     Functional Status Assessment   Patient has had a recent decline in their functional status and demonstrates the ability to make significant improvements in function in a reasonable and predictable amount of time.     Equipment Recommendations   Tub/shower seat (additionally discussed acquisition of hand held shower head)     Recommendations for Other Services   PT consult     Precautions/Restrictions   Precautions Precautions: Shoulder Type of Shoulder Precautions: per ortho PA in room, immobilize R shoulder in sling at all times. Shoulder Interventions: Shoulder sling/immobilizer;At all times;Off for dressing/bathing/exercises Precaution Booklet Issued: No Recall of Precautions/Restrictions: Impaired Precaution/Restrictions Comments: pt  aware of sling orders Restrictions Weight Bearing Restrictions Per Provider Order: Yes RUE Weight Bearing Per Provider Order: Non weight bearing     Mobility Bed Mobility Overal bed mobility: Needs Assistance Bed Mobility: Supine to Sit     Supine to sit: Supervision     General bed mobility comments: cues for technique; pt transitioned to long sit in bed initially    Transfers Overall transfer level: Needs assistance Equipment used: Quad cane Transfers: Sit to/from Stand Sit to Stand: Contact guard assist           General transfer comment: for safety, increased time      Balance Overall balance assessment: Needs assistance Sitting-balance support: No upper extremity supported, Feet supported Sitting balance-Leahy Scale: Good     Standing balance support: Single extremity supported, During functional activity Standing balance-Leahy Scale: Fair                             ADL either performed or assessed with clinical judgement   ADL Overall ADL's : Needs assistance/impaired Eating/Feeding: Set up;Sitting   Grooming: Contact guard assist;Standing   Upper Body Bathing: Minimal assistance;Sitting   Lower Body Bathing: Contact guard assist;Sit to/from stand   Upper Body Dressing : Minimal assistance Upper Body Dressing Details (indicate cue type and reason): for zipping jacket Lower Body Dressing: Contact guard assist;Sitting/lateral leans Lower Body Dressing Details (indicate cue type and reason): donning socks Toilet Transfer: Contact guard assist;Ambulation (quad cane)           Functional mobility during ADLs: Contact guard assist;Cane       Vision Patient Visual Report: No change from baseline Additional Comments: not formally assessed,  able to locate items in room     Perception Perception: Not tested       Praxis Praxis: Not tested       Pertinent Vitals/Pain Pain Assessment Pain Assessment: 0-10 Pain Score: 7  Pain  Descriptors / Indicators: Aching, Sore, Guarding Pain Intervention(s): Limited activity within patient's tolerance, Monitored during session     Extremity/Trunk Assessment Upper Extremity Assessment Upper Extremity Assessment: Right hand dominant;RUE deficits/detail RUE Deficits / Details: immobilized in shoulder sling; repositioned sling on arrival. hand function seems grossly [facility]   Lower Extremity Assessment Lower Extremity Assessment: Defer to PT evaluation       Communication Communication Communication: Other (comment) Factors Affecting Communication:  (intermittent increased time for processing and verbal expression)   Cognition Arousal: Alert Behavior During Therapy: Flat affect Cognition: No family/caregiver present to determine baseline, Cognition impaired       Memory impairment (select all impairments): Short-term memory Attention impairment (select first level of impairment): Selective attention (internally distracted)   OT - Cognition Comments: not formally assessed. Notably decr short term memory                 Following commands: Intact (for 1-2 step commands.)       Cueing  General Comments   Cueing Techniques: Verbal cues      Exercises Exercises: Other exercises Other Exercises Other Exercises: AROM of hand   Shoulder Instructions      Home Living Family/patient expects to be discharged to:: Private residence Living Arrangements: Other (Comment) (mother) Available Help at Discharge: Family;Available PRN/intermittently Type of Home: Apartment Home Access: Stairs to enter [employer] of Steps: 3 flights   Home Layout: One level     Bathroom Shower/Tub: Chief Strategy Officer: Standard     Home Equipment: Cane - quad;Adaptive equipment (ctructh) Adaptive Equipment: Long-handled shoe horn        Prior Functioning/Environment Prior Level of Function : Independent/Modified Independent              Mobility Comments: independent for ADL ADLs Comments: independent for ADL, suspect mother assists with IADL    OT Problem List: Decreased strength;Decreased activity tolerance;Impaired balance (sitting and/or standing);Decreased cognition;Decreased coordination;Decreased safety awareness;Decreased knowledge of use of DME or AE;Decreased knowledge of precautions;Pain;Impaired UE functional use   OT Treatment/Interventions: Self-care/ADL training;Therapeutic exercise;DME and/or AE instruction;Therapeutic activities;Patient/family education;Balance training      OT Goals(Current goals can be found in the care plan section)   Acute Rehab OT Goals Patient Stated Goal: get better OT Goal Formulation: With patient Time For Goal Achievement: 04/22/24 Potential to Achieve Goals: Good   OT Frequency:  Min 2X/week    Co-evaluation              AM-PAC OT 6 Clicks Daily Activity     Outcome Measure Help from another person eating meals?: A Little Help from another person taking care of personal grooming?: A Little Help from another person toileting, which includes using toliet, bedpan, or urinal?: A Little Help from another person bathing (including washing, rinsing, drying)?: A Little Help from another person to put on and taking off regular upper body clothing?: A Little Help from another person to put on and taking off regular lower body clothing?: A Little 6 Click Score: 18   End of Session Equipment Utilized During Treatment: Gait belt;Other (comment) (quad cane) Nurse Communication: Mobility status  Activity Tolerance: Patient tolerated treatment well Patient left: in bed;with call bell/phone within reach;with nursing/sitter in  room  OT Visit Diagnosis: Unsteadiness on feet (R26.81);Muscle weakness (generalized) (M62.81);Pain Pain - Right/Left: Right Pain - part of body: Shoulder                Time: 1127-1206 OT Time Calculation (min): 39 min Charges:  OT General  Charges $OT Visit: 1 Visit OT Evaluation $OT Eval Low Complexity: 1 Low OT Treatments $Self Care/Home Management : 23-37 mins  Elma JONETTA Lebron FREDERICK, OTR/L [facility] Acute Rehabilitation Office: [phone]   Elma JONETTA Lebron 04/08/2024, 12:46 PM
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
## 2024-04-08 NOTE — Consult Note (Signed)
 ORTHOPAEDIC CONSULTATION  REQUESTING PHYSICIAN: Mdala-Gausi, Masiku Agat*  Chief Complaint: my right shoulder hurts  HPI: Anthony Macias is a 43 y.o. male who presents with right shoulder pain. Has history of right shoulder greater tuberosity ORIF with bicep tenodesis and coracoid fracture fixation in 2021 with Dr Addie. Developed significant heterotopic ossification but overall had some shoulder function according to patient. Sustained fall and has had increased shoulder pain in the last day. CT demonstrates oblique mildly displaced proximal humerus fracture.  Lives at home with his mother on 3rd floor apartment.  Ambulates with cane/ crutch as baseline.  No other new joint complaints currently.    History reviewed. No pertinent past medical history. Past Surgical History:  Procedure Laterality Date   IRRIGATION AND DEBRIDEMENT ELBOW Right 03/12/2020   Procedure: IRRIGATION AND DEBRIDEMENT ELBOW;  Surgeon: Celena Sharper, MD;  Location: [facility] OR;  Service: Orthopedics;  Laterality: Right;   OPEN REDUCTION INTERNAL FIXATION (ORIF) DISTAL RADIAL FRACTURE Right 03/12/2020   Procedure: OPEN REDUCTION INTERNAL FIXATION (ORIF) RADIAL shaft FRACTURE;  Surgeon: Celena Sharper, MD;  Location: [facility] OR;  Service: Orthopedics;  Laterality: Right;   ORIF ANKLE FRACTURE Left 03/12/2020   Procedure: OPEN REDUCTION INTERNAL FIXATION (ORIF) ANKLE FRACTURE;  Surgeon: Celena Sharper, MD;  Location: [facility];  Service: Orthopedics;  Laterality: Left;   ORIF FEMUR FRACTURE Left 03/12/2020   Procedure: OPEN REDUCTION INTERNAL FIXATION (ORIF) DISTAL FEMUR FRACTURE;  Surgeon: Celena Sharper, MD;  Location: [facility];  Service: Orthopedics;  Laterality: Left;   ORIF SHOULDER FRACTURE Right 03/16/2020   Procedure: OPEN REDUCTION INTERNAL FIXATION RIGHT PROXIMAL HUMEROUS FRACTURE AND CLAVICAL FRACTURE WITH BICEPS TENODESIS;  Surgeon: Addie Cordella Hamilton, MD;  Location: [facility] OR;  Service: Orthopedics;  Laterality: Right;    SACRO-ILIAC PINNING Bilateral 03/12/2020   Procedure: GENNETT ROTA;  Surgeon: Celena Sharper, MD;  Location: [facility] OR;  Service: Orthopedics;  Laterality: Bilateral;   Social History   Socioeconomic History   Marital status: Single    Spouse name: Not on file   Number of children: Not on file   Years of education: Not on file   Highest education level: Not on file  Occupational History   Not on file  Tobacco Use   Smoking status: Never   Smokeless tobacco: Never  Vaping Use   Vaping status: Unknown  Substance and Sexual Activity   Alcohol use: Not on file   Drug use: Not on file   Sexual activity: Not on file  Other Topics Concern   Not on file  Social History Narrative   Not on file   Social Drivers of Health   Financial Resource Strain: Not on file  Food Insecurity: Not on file  Transportation Needs: Not on file  Physical Activity: Not on file  Stress: Not on file  Social Connections: Not on file   History reviewed. No pertinent family history. - negative except otherwise stated in the family history section No Known Allergies Prior to Admission medications   Not on File   CT SHOULDER RIGHT WO CONTRAST Result Date: 04/08/2024 CLINICAL DATA:  Shoulder trauma, fracture of humerus or scapula Humeral neck fracture sustained in fall. EXAM: CT OF THE UPPER RIGHT EXTREMITY WITHOUT CONTRAST TECHNIQUE: Multidetector CT imaging of the right shoulder was performed according to the standard protocol. RADIATION DOSE REDUCTION: This exam was performed according to the departmental dose-optimization program which includes automated exposure control, adjustment of the mA and/or kV according to patient size and/or use of iterative reconstruction  technique. COMPARISON:  Radiographs 04/07/2024 and 05/27/2020. Right shoulder MRI 03/15/2020 and CT 03/12/2020. FINDINGS: Bones/Joint/Cartilage Chronic postsurgical changes with a screw in the coracoid process. 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 As seen on the recent radiographs, there is an acute, mildly displaced oblique fracture through the proximal right humeral metaphysis. Glenohumeral joint space narrowing without evidence of acute scapular fracture. The right clavicle appears intact. Ligaments Suboptimally assessed by CT. Muscles and Tendons The right shoulder muscles appear unremarkable as evaluated by CT. Soft tissues No evidence of periarticular fluid collection, unexpected foreign body or soft tissue emphysema. IMPRESSION: 1. Acute, mildly displaced oblique fracture through the proximal right humeral metaphysis. 2. 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 3. No significant soft tissue abnormalities identified. Electronically Signed   By: Elsie Perone M.D.   On: 04/08/2024 09:02   CT Head Wo Contrast Result Date: 04/07/2024 CLINICAL DATA:  Head trauma, intracranial arterial injury suspected; fall. Intoxication. Shoulder pain EXAM: CT HEAD WITHOUT CONTRAST CT CERVICAL SPINE WITHOUT CONTRAST TECHNIQUE: Multidetector CT imaging of the head and cervical spine was performed following the standard protocol without intravenous contrast. Multiplanar CT image reconstructions of the cervical spine were also generated. RADIATION DOSE REDUCTION: This exam was performed according to the departmental dose-optimization program which includes automated exposure control, adjustment of the mA and/or kV according to patient size and/or use of iterative reconstruction technique. 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 Mild periventricular white matter changes are present, likely reflecting the sequela of small vessel ischemia relatively advanced given the patient's age. No acute intracranial hemorrhage or infarct. No mass effect or midline shift. No abnormal intra or extra-axial mass lesion or fluid collection. Ventricular size is normal. Cerebellum is unremarkable. Vascular: Unremarkable Skull: Normal. Negative for fracture or focal lesion. Sinuses/Orbits: No acute finding. Other: Mastoid air cells and middle ear cavities are clear. CT CERVICAL SPINE FINDINGS Alignment: Normal. Skull base and vertebrae: No acute fracture. No primary bone lesion or focal pathologic process. Soft tissues and spinal canal: No prevertebral fluid or swelling. No visible canal hematoma. Disc levels: There is endplate remodeling and disc annular calcification throughout the cervical spine in keeping with changes of mild degenerative disc disease. Prevertebral soft tissues are not thickened on sagittal reformats. Spinal is widely patent. No significant neuroforaminal narrowing. Upper chest: No acute abnormality. Healed medial right clavicle fracture is partially visualized. Other: None IMPRESSION: 1. No acute intracranial abnormality. No calvarial fracture. 2. 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 3. Mild periventricular white matter changes, likely reflecting the sequela of small vessel ischemia relatively advanced given the patient's age. 4. No acute fracture or listhesis of the cervical spine. Electronically Signed   By: Dorethia Molt M.D.   On: 04/07/2024 21:03   CT Cervical Spine Wo Contrast Result Date: 04/07/2024 CLINICAL DATA:  Head trauma, intracranial arterial injury suspected; fall. Intoxication. Shoulder pain EXAM: CT HEAD WITHOUT CONTRAST CT CERVICAL SPINE WITHOUT CONTRAST TECHNIQUE:  Multidetector CT imaging of the head and cervical spine was performed following the standard protocol without intravenous contrast. Multiplanar CT image reconstructions of the  cervical spine were also generated. RADIATION DOSE REDUCTION: This exam was performed according to the departmental dose-optimization program which includes automated exposure control, adjustment of the mA and/or kV according to patient size and/or use of iterative reconstruction technique. 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 Mild periventricular white matter changes are present, likely reflecting the sequela of small vessel ischemia relatively advanced given the patient's age. No acute intracranial hemorrhage or infarct. No mass effect or midline shift. No abnormal intra or extra-axial mass lesion or fluid collection. Ventricular size is normal. Cerebellum is unremarkable. Vascular: Unremarkable Skull: Normal. Negative for fracture or focal lesion. Sinuses/Orbits: No acute finding. Other: Mastoid air cells and middle ear cavities are clear. CT CERVICAL SPINE FINDINGS Alignment: Normal. Skull base and vertebrae: No acute fracture. No primary bone lesion or focal pathologic process. Soft tissues and spinal canal: No prevertebral fluid or swelling. No visible canal hematoma. Disc levels: There is endplate remodeling and disc annular calcification throughout the cervical spine in keeping with changes of mild degenerative disc disease. Prevertebral soft tissues are not thickened on sagittal reformats. Spinal is widely patent. No significant neuroforaminal narrowing. Upper chest: No acute abnormality. Healed medial right clavicle fracture is partially visualized. Other: None IMPRESSION: 1. No acute intracranial abnormality. No calvarial  fracture. 2. 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 3. Mild periventricular white matter changes, likely reflecting the sequela of small vessel ischemia relatively advanced given the patient's age. 4. No acute fracture or listhesis of the cervical spine. Electronically Signed   By: Dorethia Molt M.D.   On: 04/07/2024 21:03   DG Shoulder Right Result Date: 04/07/2024 CLINICAL DATA:  Fall EXAM: RIGHT SHOULDER - 2+ VIEW COMPARISON:  Right shoulder x-ray 05/27/2020 FINDINGS: There is an acute oblique fracture through the humeral neck. Fracture fragments are distracted 16 mm. Orthopedic screws seen in the region of the coracoid, unchanged. There are no humeral joint space narrowing and osteophyte formation as well as surrounding soft tissue calcifications appears stable from prior. IMPRESSION: Acute oblique fracture through the humeral neck. Electronically Signed   By: Greig Pique M.D.   On: 04/07/2024 18:49   - pertinent xrays, CT, MRI studies were reviewed and independently interpreted  Positive ROS: All other systems have been reviewed and were otherwise negative with the exception of those mentioned in the HPI and as above.  Physical Exam: General: Alert, no acute distress Psychiatric: Patient is competent for consent with normal mood and affect Lymphatic: No axillary or cervical lymphadenopathy Cardiovascular: No pedal edema Respiratory: No cyanosis, no use of accessory musculature GI: No organomegaly, abdomen is soft and non-tender    Images:  @ENCIMAGES @  Labs:  No results found for: HGBA1C, ESRSEDRATE, CRP, LABURIC, REPTSTATUS, GRAMSTAIN, CULT, LABORGA  Lab Results  Component Value Date   ALBUMIN  3.9 04/07/2024   ALBUMIN  3.6 03/12/2020   ALBUMIN  4.1 03/11/2020    Neurologic: Patient does not have protective sensation  bilateral lower extremities.   MUSCULOSKELETAL:   No pain with hip ROM bilaterally.  Severe pain with right shoulder ROM. Deltoid contraction can't be assessed due to patient's cooperation. Lateral deltoid sensation intact.  2+ radial pulse of RUE.  Intact EPL, FPL, finger abduction of right hand.  No knee effusion  bilaterally.  Assessment: Mildly displaced proximal humerus fracture  Plan: Plan for sling immobilization.  Okay for discharge home and outpatient follow-up with Dr Addie this week.  Discussed this with the patient. No need for surgical intervention at this time but will monitor progress with serial radiographs. Vitamin D  ordered to check status and may need supplementation to mitigate risk of nonunion.   Thank you for the consult and the opportunity to see Mr. Harding, [facility] [phone] 11:49 AM

## 2024-04-08 NOTE — Discharge Instructions (Signed)

## 2024-04-08 NOTE — ED Notes (Signed)
 Admitting Provider at bedside.

## 2024-04-09 ENCOUNTER — Telehealth: Payer: Self-pay

## 2024-04-09 NOTE — Telephone Encounter (Signed)
 The pt's mother called stating that the pt had been at the ED on 8/10 and was recommened to see Dr. Addie ASAP. I was able to work him in tomorrow 04/09/24 at 10:15. They do however prefer an afternoon appointment. Please call back if they can be worked in in the after noon.  [phone]- mom

## 2024-04-09 NOTE — Telephone Encounter (Signed)
 Tried calling to move to the afternoon around 3pm Wednesday, no answer. Will try to follow up in the morning.

## 2024-04-09 NOTE — Telephone Encounter (Signed)
 Moved to 3pm-patients mom called back

## 2024-04-10 ENCOUNTER — Ambulatory Visit (INDEPENDENT_AMBULATORY_CARE_PROVIDER_SITE_OTHER): Admitting: Orthopedic Surgery

## 2024-04-10 DIAGNOSIS — S42201A Unspecified fracture of upper end of right humerus, initial encounter for closed fracture: Secondary | ICD-10-CM | POA: Diagnosis not present

## 2024-04-11 ENCOUNTER — Encounter: Payer: Self-pay | Admitting: Orthopedic Surgery
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
## 2024-05-01 ENCOUNTER — Ambulatory Visit: Admitting: Orthopedic Surgery

## 2024-05-16 ENCOUNTER — Ambulatory Visit (INDEPENDENT_AMBULATORY_CARE_PROVIDER_SITE_OTHER): Admitting: Surgical

## 2024-05-16 ENCOUNTER — Other Ambulatory Visit (INDEPENDENT_AMBULATORY_CARE_PROVIDER_SITE_OTHER): Payer: Self-pay

## 2024-05-16 DIAGNOSIS — S42201A Unspecified fracture of upper end of right humerus, initial encounter for closed fracture: Secondary | ICD-10-CM

## 2024-05-16 MED ORDER — OXYCODONE HCL 5 MG PO TABS: 5.0000 mg | ORAL_TABLET | Freq: Every day | ORAL | 0 refills | Status: DC | PRN

## 2024-05-18 ENCOUNTER — Encounter: Payer: Self-pay | Admitting: Surgical
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
## 2024-06-13 ENCOUNTER — Ambulatory Visit (INDEPENDENT_AMBULATORY_CARE_PROVIDER_SITE_OTHER): Payer: Self-pay | Admitting: Orthopedic Surgery

## 2024-06-13 ENCOUNTER — Other Ambulatory Visit: Payer: Self-pay

## 2024-06-13 DIAGNOSIS — S42201A Unspecified fracture of upper end of right humerus, initial encounter for closed fracture: Secondary | ICD-10-CM | POA: Diagnosis not present

## 2024-06-14 ENCOUNTER — Encounter: Payer: Self-pay | Admitting: Orthopedic Surgery

## 2024-06-14 MED ORDER — OXYCODONE HCL 5 MG PO CAPS: 5.0000 mg | ORAL_CAPSULE | Freq: Three times a day (TID) | ORAL | 0 refills | Status: AC | PRN
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
## 2024-07-12 ENCOUNTER — Other Ambulatory Visit: Payer: Self-pay

## 2024-07-12 MED ORDER — AMOXICILLIN 500 MG PO CAPS
500.0000 mg | ORAL_CAPSULE | Freq: Three times a day (TID) | ORAL | 0 refills | Status: AC
  Filled 2024-07-12: qty 21, 7d supply, fill #0

## 2024-07-12 MED ORDER — IBUPROFEN 800 MG PO TABS
800.0000 mg | ORAL_TABLET | Freq: Three times a day (TID) | ORAL | 0 refills | Status: AC
  Filled 2024-07-12: qty 21, 7d supply, fill #0

## 2024-07-15 ENCOUNTER — Other Ambulatory Visit: Payer: Self-pay | Admitting: Orthopedic Surgery

## 2024-07-15 ENCOUNTER — Other Ambulatory Visit: Payer: Self-pay

## 2024-07-15 ENCOUNTER — Telehealth: Payer: Self-pay | Admitting: Orthopedic Surgery

## 2024-07-15 MED ORDER — OXYCODONE HCL 5 MG PO TABS: 5.0000 mg | ORAL_TABLET | Freq: Every day | ORAL | 0 refills | Status: AC | PRN

## 2024-07-15 NOTE — Telephone Encounter (Signed)
Pt was called and advised and stated understanding

## 2024-07-15 NOTE — Telephone Encounter (Signed)
 Meds sent - last one before needs to go to pain mngmt

## 2024-07-15 NOTE — Telephone Encounter (Signed)
 Patient called to get a refill on oxycodone . [phone]

## 2024-07-23 ENCOUNTER — Other Ambulatory Visit: Payer: Self-pay
